# Patient Record
Sex: Female | Born: 1975 | Race: White | Hispanic: No | Marital: Married | Smoking: Never smoker
Health system: Southern US, Community
[De-identification: ages and names within clinical notes are randomized; demographics above are authoritative.]

## PROBLEM LIST (undated history)

## (undated) DIAGNOSIS — R1031 Right lower quadrant pain: Secondary | ICD-10-CM

## (undated) DIAGNOSIS — G43909 Migraine, unspecified, not intractable, without status migrainosus: Secondary | ICD-10-CM

## (undated) DIAGNOSIS — F988 Other specified behavioral and emotional disorders with onset usually occurring in childhood and adolescence: Secondary | ICD-10-CM

## (undated) DIAGNOSIS — Z0000 Encounter for general adult medical examination without abnormal findings: Secondary | ICD-10-CM

## (undated) DIAGNOSIS — M797 Fibromyalgia: Secondary | ICD-10-CM

## (undated) DIAGNOSIS — E782 Mixed hyperlipidemia: Principal | ICD-10-CM

## (undated) DIAGNOSIS — I2699 Other pulmonary embolism without acute cor pulmonale: Secondary | ICD-10-CM

## (undated) DIAGNOSIS — F419 Anxiety disorder, unspecified: Secondary | ICD-10-CM

## (undated) DIAGNOSIS — T7840XA Allergy, unspecified, initial encounter: Secondary | ICD-10-CM

## (undated) DIAGNOSIS — K8020 Calculus of gallbladder without cholecystitis without obstruction: Secondary | ICD-10-CM

## (undated) DIAGNOSIS — N809 Endometriosis, unspecified: Secondary | ICD-10-CM

## (undated) DIAGNOSIS — M1990 Unspecified osteoarthritis, unspecified site: Secondary | ICD-10-CM

## (undated) DIAGNOSIS — F418 Other specified anxiety disorders: Secondary | ICD-10-CM

## (undated) HISTORY — PX: TUBAL LIGATION: SHX77

## (undated) HISTORY — DX: Unspecified osteoarthritis, unspecified site: M19.90

## (undated) HISTORY — PX: WISDOM TOOTH EXTRACTION: SHX21

## (undated) HISTORY — DX: Fibromyalgia: M79.7

## (undated) HISTORY — DX: Other specified anxiety disorders: F41.8

## (undated) HISTORY — DX: Endometriosis, unspecified: N80.9

## (undated) HISTORY — DX: Migraine, unspecified, not intractable, without status migrainosus: G43.909

## (undated) HISTORY — PX: HERNIA REPAIR: SHX51

## (undated) HISTORY — DX: Anxiety disorder, unspecified: F41.9

## (undated) HISTORY — DX: Other specified behavioral and emotional disorders with onset usually occurring in childhood and adolescence: F98.8

## (undated) HISTORY — DX: Right lower quadrant pain: R10.31

## (undated) HISTORY — DX: Encounter for general adult medical examination without abnormal findings: Z00.00

## (undated) HISTORY — PX: EYE SURGERY: SHX253

## (undated) HISTORY — DX: Allergy, unspecified, initial encounter: T78.40XA

## (undated) HISTORY — PX: LAPAROTOMY: SHX154

## (undated) HISTORY — DX: Mixed hyperlipidemia: E78.2

---

## 2001-02-19 ENCOUNTER — Encounter: Admission: RE | Admit: 2001-02-19 | Discharge: 2001-02-19 | Payer: Self-pay | Admitting: Family Medicine

## 2002-07-07 ENCOUNTER — Other Ambulatory Visit: Admission: RE | Admit: 2002-07-07 | Discharge: 2002-07-07 | Payer: Self-pay | Admitting: Obstetrics and Gynecology

## 2002-11-24 ENCOUNTER — Ambulatory Visit (HOSPITAL_COMMUNITY): Admission: RE | Admit: 2002-11-24 | Discharge: 2002-11-24 | Payer: Self-pay | Admitting: Obstetrics and Gynecology

## 2002-11-24 ENCOUNTER — Encounter (INDEPENDENT_AMBULATORY_CARE_PROVIDER_SITE_OTHER): Payer: Self-pay | Admitting: Specialist

## 2003-07-21 ENCOUNTER — Other Ambulatory Visit: Admission: RE | Admit: 2003-07-21 | Discharge: 2003-07-21 | Payer: Self-pay | Admitting: Obstetrics and Gynecology

## 2004-07-25 ENCOUNTER — Other Ambulatory Visit: Admission: RE | Admit: 2004-07-25 | Discharge: 2004-07-25 | Payer: Self-pay | Admitting: Obstetrics and Gynecology

## 2008-09-25 HISTORY — PX: ABDOMINAL HYSTERECTOMY: SHX81

## 2010-05-03 ENCOUNTER — Emergency Department (HOSPITAL_BASED_OUTPATIENT_CLINIC_OR_DEPARTMENT_OTHER): Admission: EM | Admit: 2010-05-03 | Discharge: 2010-05-04 | Payer: Self-pay | Admitting: Emergency Medicine

## 2010-05-04 ENCOUNTER — Ambulatory Visit: Payer: Self-pay | Admitting: Interventional Radiology

## 2010-12-09 LAB — URINALYSIS, ROUTINE W REFLEX MICROSCOPIC
Bilirubin Urine: NEGATIVE
Glucose, UA: NEGATIVE mg/dL
Leukocytes, UA: NEGATIVE
Protein, ur: NEGATIVE mg/dL
Specific Gravity, Urine: 1.018 (ref 1.005–1.030)
Urobilinogen, UA: 0.2 mg/dL (ref 0.0–1.0)

## 2010-12-09 LAB — URINE MICROSCOPIC-ADD ON

## 2010-12-09 LAB — DIFFERENTIAL
Eosinophils Absolute: 0.1 10*3/uL (ref 0.0–0.7)
Eosinophils Relative: 1 % (ref 0–5)
Monocytes Absolute: 0.9 10*3/uL (ref 0.1–1.0)
Monocytes Relative: 10 % (ref 3–12)
Neutrophils Relative %: 61 % (ref 43–77)

## 2010-12-09 LAB — BASIC METABOLIC PANEL
CO2: 28 mEq/L (ref 19–32)
Calcium: 9.3 mg/dL (ref 8.4–10.5)
Chloride: 102 mEq/L (ref 96–112)
GFR calc Af Amer: >60 mL/min (ref >60)
GFR calc non Af Amer: >60 mL/min (ref >60)

## 2010-12-09 LAB — CBC
RBC: 4.29 MIL/uL (ref 3.87–5.11)
RDW: 11.4 % — ABNORMAL LOW (ref 11.5–15.5)
WBC: 9.1 10*3/uL (ref 4.0–10.5)

## 2011-02-10 NOTE — Op Note (Signed)
NAME:  Whitney Trujillo, Whitney Trujillo                       ACCOUNT NO.:  [id number]   MEDICAL RECORD NO.:  [id number]                   PATIENT TYPE:  AMB   LOCATION:  SDC                                  FACILITY:  [facility]   PHYSICIAN:  Maxie Better, M.D.            DATE OF BIRTH:  [birth year]   DATE OF PROCEDURE:  11/24/2002  DATE OF DISCHARGE:                                 OPERATIVE REPORT   PREOPERATIVE DIAGNOSES:  Severe dysmenorrhea.   PROCEDURE:  1. Diagnostic laparoscopy.  2. Fulguration of endometriotic implants.  3. Peritoneal biopsy.   POSTOPERATIVE DIAGNOSES:  1. Severe dysmenorrhea.  2. Stage I pelvic endometriosis.   ANESTHESIA:  General.   SURGEON:  Maxie Better, M.D.   INDICATIONS:  This is a 35 year old G0 married white female with severe  dysmenorrhea who now presents for surgical evaluation.  Risks and benefits  of the procedure have been explained to the patient.  Consent was signed.  The patient was transferred to the operating room.   PROCEDURE:  Under adequate general anesthesia the patient was placed in the  dorsal lithotomy position.  Examination under anesthesia revealed a small  anteverted uterus.  No adnexal masses could be appreciated.  The patient was  sterilely prepped and draped in the usual fashion.  The bladder catheterized  for small amount of urine.  A bivalve speculum was placed in vagina.  Single  tooth tenaculum was placed in the anterior lip of the cervix.  An acorn  cannula was introduced into the cervical os and attached to tenaculum for  manipulation of the uterus.  The bivalve speculum was then removed.  Attention was then turned to the abdomen where 0.25% Marcaine was injected  infraumbilical.  A small infraumbilical incision was then made.  Veress  needle was introduced.  A test of saline with good placement noted.  Open  pressure of 8 was noted.  2.5 L of carbon dioxide was insufflated.  The  Veress needle was removed.  A  disposable 10 mm trocar with sleeve was  introduced into the abdomen without incident.  A lighted video laparoscope  was introduced through that port.  The patient was placed in Trendelenburg.  A second port was then placed suprapubically with a small incision being  made suprapubically and under direct visualization a 5 mm port was then  placed.  Through that port of the suprapubic area the pelvis was inspected.  The uterus was noted to be normal.  Both ovaries and tubes were noted to be  normal.  The anterior cul-de-sac was without any endometriotic implant.  Posterior cul-de-sac with three areas of implants surrounding the left  uterosacral ligament on the right, two other spots of endometriotic implants  were noted.  __________ windows was noted on the right side.  Left  peritoneal biopsy was performed and the endometriotic implants were  fulgurated using __________  apparatus.  The appendix was noted to  be  normal.  Normal liver edges noted.  The procedure was then terminated by  deflating the abdomen and under direct visualization the infraumbilical port  was ultimately removed taking care not to bring up any underlying structure.  The suprapubic port had been removed.  The instruments from the vagina were  then removed.  The infraumbilical port was closed with Dermabond.  The skin  on the suprapubic site with a single suture of 4-0 Vicryl.   SPECIMENS:  Peritoneal biopsy sent for evaluation of endometriosis.   ESTIMATED BLOOD LOSS:  Minimal.   COMPLICATIONS:  None.   DISPOSITION:  The patient tolerated procedure well.  Was transferred to  recovery room in stable condition.                                               Maxie Better, M.D.    Level Green/MEDQ  D:  11/24/2002  T:  11/24/2002  Job:  [id number]

## 2011-09-26 HISTORY — PX: OOPHORECTOMY: SHX86

## 2013-03-13 ENCOUNTER — Emergency Department (HOSPITAL_COMMUNITY): Payer: 59

## 2013-03-13 ENCOUNTER — Emergency Department (HOSPITAL_COMMUNITY)
Admission: EM | Admit: 2013-03-13 | Discharge: 2013-03-13 | Disposition: A | Discharge status: Home / Self Care | Payer: 59 | Attending: Emergency Medicine | Admitting: Emergency Medicine

## 2013-03-13 ENCOUNTER — Encounter (HOSPITAL_COMMUNITY): Payer: Self-pay | Admitting: Family Medicine

## 2013-03-13 DIAGNOSIS — Z79899 Other long term (current) drug therapy: Secondary | ICD-10-CM | POA: Insufficient documentation

## 2013-03-13 DIAGNOSIS — R748 Abnormal levels of other serum enzymes: Secondary | ICD-10-CM | POA: Insufficient documentation

## 2013-03-13 DIAGNOSIS — K8020 Calculus of gallbladder without cholecystitis without obstruction: Secondary | ICD-10-CM | POA: Insufficient documentation

## 2013-03-13 DIAGNOSIS — R110 Nausea: Secondary | ICD-10-CM | POA: Insufficient documentation

## 2013-03-13 DIAGNOSIS — R17 Unspecified jaundice: Secondary | ICD-10-CM | POA: Insufficient documentation

## 2013-03-13 DIAGNOSIS — Z91040 Latex allergy status: Secondary | ICD-10-CM | POA: Insufficient documentation

## 2013-03-13 DIAGNOSIS — R6883 Chills (without fever): Secondary | ICD-10-CM | POA: Insufficient documentation

## 2013-03-13 DIAGNOSIS — Z8679 Personal history of other diseases of the circulatory system: Secondary | ICD-10-CM | POA: Insufficient documentation

## 2013-03-13 HISTORY — DX: Calculus of gallbladder without cholecystitis without obstruction: K80.20

## 2013-03-13 HISTORY — DX: Other pulmonary embolism without acute cor pulmonale: I26.99

## 2013-03-13 LAB — URINALYSIS, ROUTINE W REFLEX MICROSCOPIC
Leukocytes, UA: NEGATIVE
Nitrite: NEGATIVE
Protein, ur: NEGATIVE mg/dL
pH: 6.5 (ref 5.0–8.0)

## 2013-03-13 LAB — CBC WITH DIFFERENTIAL/PLATELET
Basophils Absolute: 0 10*3/uL (ref 0.0–0.1)
Basophils Relative: 1 % (ref 0–1)
Eosinophils Absolute: 0 10*3/uL (ref 0.0–0.7)
HCT: 42.1 % (ref 36.0–46.0)
Hemoglobin: 15 g/dL (ref 12.0–15.0)
Lymphocytes Relative: 47 % — ABNORMAL HIGH (ref 12–46)
Lymphs Abs: 3.1 10*3/uL (ref 0.7–4.0)
MCH: 29.5 pg (ref 26.0–34.0)
MCHC: 35.6 g/dL (ref 30.0–36.0)
Monocytes Absolute: 0.6 10*3/uL (ref 0.1–1.0)
Monocytes Relative: 9 % (ref 3–12)
Neutrophils Relative %: 43 % (ref 43–77)
RBC: 5.08 MIL/uL (ref 3.87–5.11)
RDW: 11.7 % (ref 11.5–15.5)
WBC: 6.7 10*3/uL (ref 4.0–10.5)

## 2013-03-13 LAB — COMPREHENSIVE METABOLIC PANEL
AST: 15 U/L (ref 0–37)
BUN: 8 mg/dL (ref 6–23)
CO2: 26 mEq/L (ref 19–32)
Creatinine, Ser: 0.76 mg/dL (ref 0.50–1.10)
Sodium: 136 mEq/L (ref 135–145)
Total Bilirubin: 1.9 mg/dL — ABNORMAL HIGH (ref 0.3–1.2)
Total Protein: 7.2 g/dL (ref 6.0–8.3)

## 2013-03-13 LAB — URINE MICROSCOPIC-ADD ON

## 2013-03-13 LAB — LIPASE, BLOOD: Lipase: 62 U/L — ABNORMAL HIGH (ref 11–59)

## 2013-03-13 MED ORDER — MORPHINE SULFATE 4 MG/ML IJ SOLN
4.0000 mg | Freq: Once | INTRAMUSCULAR | Status: AC
  Administered 2013-03-13: 4 mg via INTRAVENOUS
  Filled 2013-03-13: qty 1

## 2013-03-13 MED ORDER — SODIUM CHLORIDE 0.9 % IV BOLUS (SEPSIS)
1000.0000 mL | Freq: Once | INTRAVENOUS | Status: AC
  Administered 2013-03-13: 1000 mL via INTRAVENOUS

## 2013-03-13 MED ORDER — ONDANSETRON HCL 4 MG/2ML IJ SOLN
4.0000 mg | Freq: Once | INTRAMUSCULAR | Status: AC
  Administered 2013-03-13: 4 mg via INTRAVENOUS
  Filled 2013-03-13: qty 2

## 2013-03-13 MED ORDER — OXYCODONE-ACETAMINOPHEN 5-325 MG PO TABS: 1.0000 | ORAL_TABLET | ORAL | Status: DC | PRN

## 2013-03-13 MED ORDER — ONDANSETRON 8 MG PO TBDP: ORAL_TABLET | ORAL | Status: DC

## 2013-03-13 MED ORDER — OXYCODONE-ACETAMINOPHEN 5-325 MG PO TABS
2.0000 | ORAL_TABLET | Freq: Once | ORAL | Status: AC
  Administered 2013-03-13: 2 via ORAL
  Filled 2013-03-13: qty 2

## 2013-03-13 NOTE — ED Notes (Signed)
Waiting for 2nd bolus to finish before discharging pt per MD.

## 2013-03-13 NOTE — ED Notes (Signed)
Patient states that she has gallstones. Presents c/o abdominal pain. 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 States pain keeps getting worse. Has not had medication for pain.

## 2013-03-13 NOTE — ED Provider Notes (Signed)
History     CSN: [id number]  Arrival date & time 03/13/13  1953   First MD Initiated Contact with Patient 03/13/13 2021      Chief Complaint  Patient presents with  . Abdominal Pain   HPI  History provided by the patient. Patient is a 37 year old female with past history of PE and recent diagnosis of gallstones presents with complaints of persistent upper abdominal pains. Patient first began having symptoms 1-2 weeks ago. She was being worked up by her Scientific laboratory technician in [employer]. She had ultrasound last week on Thursday demonstrating gallstones. She continued to have pains and was not use any treatments at home for her symptoms. She followed up again today with a GI specialist who sent off blood work but did not feel she had any emergent or concerning symptoms from her gallstones. She does state that her GI specialist also thought she may have Gilbert's syndrome. Patient has been scheduled with the general surgeon through have her gallbladder and gallstones removed on July 3. She is concerned that she is too uncomfortable to wait that long. Her pains are worse with eating and drinking. They are described as sharp radiating around the right back but also because her whole abdomen to be in pain. She has been nauseous without any episodes of vomiting. Denies fever but has had some chills. No other aggravating or alleviating factors. No other associated symptoms.     Past Medical History  Diagnosis Date  . Gallstones   . Pulmonary emboli     Past Surgical History  Procedure Laterality Date  . Laparotomy    . Cesarean section  2007, 2009  . Abdominal hysterectomy      No family history on file.  History  Substance Use Topics  . Smoking status: Not on file  . Smokeless tobacco: Not on file  . Alcohol Use: Yes     Comment: Socially    OB History   Grav Para Term Preterm Abortions TAB SAB Ect Mult Living                  Review of Systems  Constitutional: Positive  for chills. Negative for fever, diaphoresis and appetite change.  Gastrointestinal: Positive for nausea and abdominal pain. Negative for vomiting, diarrhea and constipation.  Genitourinary: Negative for dysuria, frequency, hematuria and flank pain.  Skin: Negative for rash.  All other systems reviewed and are negative.    Allergies  Gluten meal and Latex  Home Medications   Current Outpatient Rx  Name  Route  Sig  Dispense  Refill  . atomoxetine (STRATTERA) 80 MG capsule   Oral   Take 80 mg by mouth daily.         Marland Kitchen dicyclomine (BENTYL) 20 MG tablet   Oral   Take 20 mg by mouth every 6 (six) hours as needed (IBS).         Marland Kitchen ibuprofen (ADVIL,MOTRIN) 200 MG tablet   Oral   Take 800 mg by mouth every 6 (six) hours as needed for pain.           BP 112/68  Pulse 94  Temp(Src) 98.3 F (36.8 C) (Oral)  Resp 21  Ht 4\' 11"  (1.499 m)  Wt 108 lb (48.988 kg)  BMI 21.8 kg/m2  SpO2 100%  Physical Exam  Nursing note and vitals reviewed. Constitutional: She is oriented to person, place, and time. She appears well-developed and well-nourished. No distress.  HENT:  Head: Normocephalic.  Eyes: Conjunctivae are normal.  No icterus  Cardiovascular: Normal rate and regular rhythm.   No murmur heard. Pulmonary/Chest: Effort normal and breath sounds normal. No respiratory distress. She has no wheezes. She has no rales.  Abdominal: Soft. She exhibits no distension and no mass. There is no hepatosplenomegaly. There is tenderness in the right upper quadrant, epigastric area and left upper quadrant. There is positive Murphy's sign. There is no rigidity, no rebound, no guarding, no CVA tenderness and no tenderness at McBurney's point.  Patient has some mild diffuse tenderness with increased pains across the right upper quadrant and epigastric area.  Musculoskeletal: Normal range of motion. She exhibits no edema.  Neurological: She is alert and oriented to person, place, and time.  Skin:  Skin is warm and dry. No rash noted.  No jaundice  Psychiatric: She has a normal mood and affect. Her behavior is normal.    ED Course  Procedures   Results for orders placed during the hospital encounter of 03/13/13  CBC WITH DIFFERENTIAL      Result Value Range   WBC 6.7  4.0 - 10.5 K/uL   RBC 5.08  3.87 - 5.11 MIL/uL   Hemoglobin 15.0  12.0 - 15.0 g/dL   HCT 16.1  09.6 - 04.5 %   MCV 82.9  78.0 - 100.0 fL   MCH 29.5  26.0 - 34.0 pg   MCHC 35.6  30.0 - 36.0 g/dL   RDW 40.9  81.1 - 91.4 %   Platelets 228  150 - 400 K/uL   Neutrophils Relative % 43  43 - 77 %   Neutro Abs 2.9  1.7 - 7.7 K/uL   Lymphocytes Relative 47 (*) 12 - 46 %   Lymphs Abs 3.1  0.7 - 4.0 K/uL   Monocytes Relative 9  3 - 12 %   Monocytes Absolute 0.6  0.1 - 1.0 K/uL   Eosinophils Relative 0  0 - 5 %   Eosinophils Absolute 0.0  0.0 - 0.7 K/uL   Basophils Relative 1  0 - 1 %   Basophils Absolute 0.0  0.0 - 0.1 K/uL  COMPREHENSIVE METABOLIC PANEL      Result Value Range   Sodium 136  135 - 145 mEq/L   Potassium 3.2 (*) 3.5 - 5.1 mEq/L   Chloride 100  96 - 112 mEq/L   CO2 26  19 - 32 mEq/L   Glucose, Bld 94  70 - 99 mg/dL   BUN 8  6 - 23 mg/dL   Creatinine, Ser 7.82  0.50 - 1.10 mg/dL   Calcium 9.5  8.4 - 95.6 mg/dL   Total Protein 7.2  6.0 - 8.3 g/dL   Albumin 4.4  3.5 - 5.2 g/dL   AST 15  0 - 37 U/L   ALT 14  0 - 35 U/L   Alkaline Phosphatase 40  39 - 117 U/L   Total Bilirubin 1.9 (*) 0.3 - 1.2 mg/dL   GFR calc non Af Amer >90  >90 mL/min   GFR calc Af Amer >90  >90 mL/min  LIPASE, BLOOD      Result Value Range   Lipase 62 (*) 11 - 59 U/L  URINALYSIS, ROUTINE W REFLEX MICROSCOPIC      Result Value Range   Color, Urine YELLOW  YELLOW   APPearance CLEAR  CLEAR   Specific Gravity, Urine 1.009  1.005 - 1.030   pH 6.5  5.0 - 8.0   Glucose, UA  NEGATIVE  NEGATIVE mg/dL   Hgb urine dipstick TRACE (*) NEGATIVE   Bilirubin Urine NEGATIVE  NEGATIVE   Ketones, ur NEGATIVE  NEGATIVE mg/dL   Protein,  ur NEGATIVE  NEGATIVE mg/dL   Urobilinogen, UA 0.2  0.0 - 1.0 mg/dL   Nitrite NEGATIVE  NEGATIVE   Leukocytes, UA NEGATIVE  NEGATIVE  URINE MICROSCOPIC-ADD ON      Result Value Range   Squamous Epithelial / LPF RARE  RARE   WBC, UA 0-2  <3 WBC/hpf   RBC / HPF 0-2  <3 RBC/hpf   Bacteria, UA RARE  RARE       US Abdomen Complete  03/13/2013   *RADIOLOGY REPORT*  Clinical Data:  Right upper quadrant pain  COMPLETE ABDOMINAL ULTRASOUND  Comparison:  Recent abdominal ultrasound 03/06/2013  Findings:  Gallbladder:  Mobile 3 mm echogenic focus consistent with a solitary gallstone.  No gallbladder wall thickening or pericholecystic fluid.  Per the sonographer, the sonographic Murphy's sign was negative.  Common bile duct:  Within normal limits at 3.1 mm in caliber.  Liver:  No focal lesion identified.  Within normal limits in parenchymal echogenicity.  IVC:  Appears normal.  Pancreas:  No focal abnormality seen.  Spleen:  Sonographically unremarkable.  7.4 cm in length.  Right Kidney:  Normal reniform contour without hydronephrosis.  The kidney measures 8 cm in length.  Left Kidney:  Normal reniform contour without hydronephrosis.  The kidney measures 8.2 cm in length.  Abdominal aorta:  No aneurysm identified.  IMPRESSION:  Cholelithiasis without sonographic findings of acute cholecystitis.   Original Report Authenticated By: Malachy Moan, M.D.     1. Gallstones   2. Hyperbilirubinemia   3. Elevated lipase       MDM  8:30PM patient seen and evaluated. The patient appears well in no acute distress. Does appear in mild discomfort.  Patient is feeling much better after medications. Her lab tests and ultrasound did not show any clear signs for an emergent condition. She does have slightly elevated lipase and bilirubin level remains elevated per her history. There no signs of ultrasound of obstructing common bile duct stone. LFTs are normal. At this time we will try outpatient treatment and  provided prescriptions for pain medication and nausea medication. She will contact her surgeon and specialist tomorrow for close followup. She was given strict return precautions.      Angus Seller, PA-C 03/14/13 [phone]

## 2013-03-13 NOTE — ED Notes (Signed)
US at bedside

## 2013-03-15 NOTE — ED Provider Notes (Signed)
Medical screening examination/treatment/procedure(s) were performed by non-physician practitioner and as supervising physician I was immediately available for consultation/collaboration.  Caleen Taaffe R. Brodan Grewell, MD 03/15/13 1548 

## 2013-07-31 ENCOUNTER — Other Ambulatory Visit: Payer: Self-pay

## 2013-09-25 HISTORY — PX: CHOLECYSTECTOMY: SHX55

## 2014-07-07 ENCOUNTER — Encounter: Payer: Self-pay | Admitting: Internal Medicine

## 2014-07-07 ENCOUNTER — Ambulatory Visit (INDEPENDENT_AMBULATORY_CARE_PROVIDER_SITE_OTHER): Payer: 59 | Admitting: Internal Medicine

## 2014-07-07 ENCOUNTER — Encounter: Payer: Self-pay | Admitting: *Deleted

## 2014-07-07 VITALS — BP 116/60 | HR 59 | Temp 98.5°F | Resp 16 | Ht 59.0 in | Wt 117.0 lb

## 2014-07-07 DIAGNOSIS — I2699 Other pulmonary embolism without acute cor pulmonale: Secondary | ICD-10-CM | POA: Insufficient documentation

## 2014-07-07 DIAGNOSIS — Z90710 Acquired absence of both cervix and uterus: Secondary | ICD-10-CM

## 2014-07-07 DIAGNOSIS — N809 Endometriosis, unspecified: Secondary | ICD-10-CM

## 2014-07-07 DIAGNOSIS — F411 Generalized anxiety disorder: Secondary | ICD-10-CM

## 2014-07-07 HISTORY — DX: Endometriosis, unspecified: N80.9

## 2014-07-07 HISTORY — DX: Other pulmonary embolism without acute cor pulmonale: I26.99

## 2014-07-07 NOTE — Patient Instructions (Signed)
Schedule CPE

## 2014-07-07 NOTE — Progress Notes (Signed)
Subjective:    Patient ID: Whitney Trujillo, female    DOB: [birth year], 38 y.o.   MRN: [id number]  HPI New pt here for first visit  To establish primary care  PMH complicated endometriosis  S/P hysterectomy with left ovary retained,  Anxiety on Lexapro for 1 year, and bilateral PE post surgical  PE bilateral distribution had work up with [facility] and pt told there was not a genetic compornent to this   Endometriosis has [facility]  Who believes endometirosis involving bowel causing pain and diarrhea.  Dr. Noland FordyceLentz has recommended surgery but pt holding off  "as long as I can"    Labs from Dr. Loralie ChampagneMcKinney's office great B12 high but pt was taking sublingual B12 too often  She only takes a few times a week now   See scanned labs   Allergies  Allergen Reactions  . Gluten Meal Other (See Comments)    Cramping and diarrhea  . Latex Itching   Past Medical History  Diagnosis Date  . Gallstones   . Pulmonary emboli   . Anxiety   . Endometriosis    Past Surgical History  Procedure Laterality Date  . Laparotomy    . Cesarean section  2007, 2009  . Abdominal hysterectomy    . Cholecystectomy    . Eye surgery    . Wisdom tooth extraction     History   Social History  . Marital Status: Single    Spouse Name: N/A    Number of Children: N/A  . Years of Education: N/A   Occupational History  . Not on file.   Social History Main Topics  . Smoking status: Never Smoker   . Smokeless tobacco: Never Used  . Alcohol Use: Yes     Comment: Socially  . Drug Use: No  . Sexual Activity: Yes    Partners: Male   Other Topics Concern  . Not on file   Social History Narrative  . No narrative on file   Family History  Problem Relation Age of Onset  . Cancer Father   . Colon cancer Father   . Ovarian cancer Maternal Aunt    Patient Active Problem List   Diagnosis Date Noted  . Anxiety state 07/07/2014  . Endometriosis 07/07/2014  . Acute  pulmonary embolism  5 wks post hysterectomy 07/07/2014   Current Outpatient Prescriptions on File Prior to Visit  Medication Sig Dispense Refill  . dicyclomine (BENTYL) 20 MG tablet Take 20 mg by mouth every 6 (six) hours as needed (IBS).      Marland Kitchen. ibuprofen (ADVIL,MOTRIN) 200 MG tablet Take 800 mg by mouth every 6 (six) hours as needed for pain.       No current facility-administered medications on file prior to visit.       Review of Systems See HPI    Objective:   Physical Exam Physical Exam  Nursing note and vitals reviewed.  Constitutional: She is oriented to person, place, and time. She appears well-developed and well-nourished.  HENT:  Head: Normocephalic and atraumatic.  Cardiovascular: Normal rate and regular rhythm. Exam reveals no gallop and no friction rub.  No murmur heard.  Pulmonary/Chest: Breath sounds normal. She has no wheezes. She has no rales.  Neurological: She is alert and oriented to person, place, and time.  Skin: Skin is warm and dry.  Psychiatric: She has a normal mood and affect. Her behavior is normal.  Assessment & Plan:  Anxiety  Continue Lexapro  Bilateral post surgical PE  Genetic work up neg per pt report  Will need records  Complicated endometriosis with suspected bowel involvement.  Recommedned Left ovary removal .     Managed by gyn onc [facility] and Dr. Loman Chromanhoton   Schedule CPE

## 2014-07-30 ENCOUNTER — Ambulatory Visit (INDEPENDENT_AMBULATORY_CARE_PROVIDER_SITE_OTHER): Payer: 59 | Admitting: Internal Medicine

## 2014-07-30 ENCOUNTER — Encounter (HOSPITAL_BASED_OUTPATIENT_CLINIC_OR_DEPARTMENT_OTHER): Payer: Self-pay | Admitting: *Deleted

## 2014-07-30 ENCOUNTER — Emergency Department (HOSPITAL_BASED_OUTPATIENT_CLINIC_OR_DEPARTMENT_OTHER)
Admission: EM | Admit: 2014-07-30 | Discharge: 2014-07-30 | Disposition: A | Discharge status: Home / Self Care | Payer: 59 | Attending: Emergency Medicine | Admitting: Emergency Medicine

## 2014-07-30 ENCOUNTER — Emergency Department (HOSPITAL_BASED_OUTPATIENT_CLINIC_OR_DEPARTMENT_OTHER): Payer: 59

## 2014-07-30 ENCOUNTER — Encounter: Payer: Self-pay | Admitting: Internal Medicine

## 2014-07-30 VITALS — BP 101/63 | HR 80 | Temp 98.3°F | Resp 16 | Ht 59.0 in | Wt 115.0 lb

## 2014-07-30 DIAGNOSIS — R197 Diarrhea, unspecified: Secondary | ICD-10-CM

## 2014-07-30 DIAGNOSIS — Z91040 Latex allergy status: Secondary | ICD-10-CM | POA: Insufficient documentation

## 2014-07-30 DIAGNOSIS — Z79899 Other long term (current) drug therapy: Secondary | ICD-10-CM | POA: Diagnosis not present

## 2014-07-30 DIAGNOSIS — F419 Anxiety disorder, unspecified: Secondary | ICD-10-CM | POA: Diagnosis not present

## 2014-07-30 DIAGNOSIS — Z9089 Acquired absence of other organs: Secondary | ICD-10-CM | POA: Insufficient documentation

## 2014-07-30 DIAGNOSIS — K529 Noninfective gastroenteritis and colitis, unspecified: Secondary | ICD-10-CM | POA: Insufficient documentation

## 2014-07-30 DIAGNOSIS — Z1211 Encounter for screening for malignant neoplasm of colon: Secondary | ICD-10-CM

## 2014-07-30 DIAGNOSIS — Z86711 Personal history of pulmonary embolism: Secondary | ICD-10-CM | POA: Diagnosis not present

## 2014-07-30 DIAGNOSIS — Z90710 Acquired absence of both cervix and uterus: Secondary | ICD-10-CM | POA: Diagnosis not present

## 2014-07-30 DIAGNOSIS — R079 Chest pain, unspecified: Secondary | ICD-10-CM

## 2014-07-30 DIAGNOSIS — Z8742 Personal history of other diseases of the female genital tract: Secondary | ICD-10-CM | POA: Insufficient documentation

## 2014-07-30 DIAGNOSIS — R1084 Generalized abdominal pain: Secondary | ICD-10-CM

## 2014-07-30 DIAGNOSIS — R1013 Epigastric pain: Secondary | ICD-10-CM | POA: Diagnosis present

## 2014-07-30 DIAGNOSIS — R109 Unspecified abdominal pain: Secondary | ICD-10-CM

## 2014-07-30 LAB — URINALYSIS, ROUTINE W REFLEX MICROSCOPIC
BILIRUBIN URINE: NEGATIVE
Glucose, UA: NEGATIVE mg/dL
Ketones, ur: 15 mg/dL — AB
Leukocytes, UA: NEGATIVE
NITRITE: NEGATIVE
Protein, ur: NEGATIVE mg/dL
SPECIFIC GRAVITY, URINE: 1.004 — AB (ref 1.005–1.030)
UROBILINOGEN UA: 0.2 mg/dL (ref 0.0–1.0)
pH: 6 (ref 5.0–8.0)

## 2014-07-30 LAB — CBC WITH DIFFERENTIAL/PLATELET
BASOS PCT: 0 % (ref 0–1)
Basophils Absolute: 0 10*3/uL (ref 0.0–0.1)
EOS ABS: 0 10*3/uL (ref 0.0–0.7)
EOS PCT: 0 % (ref 0–5)
HEMATOCRIT: 42.6 % (ref 36.0–46.0)
HEMOGLOBIN: 14.6 g/dL (ref 12.0–15.0)
Lymphocytes Relative: 34 % (ref 12–46)
Lymphs Abs: 2.3 10*3/uL (ref 0.7–4.0)
MCH: 30.3 pg (ref 26.0–34.0)
MCHC: 34.3 g/dL (ref 30.0–36.0)
MCV: 88.4 fL (ref 78.0–100.0)
MONO ABS: 0.7 10*3/uL (ref 0.1–1.0)
MONOS PCT: 10 % (ref 3–12)
Neutro Abs: 3.7 10*3/uL (ref 1.7–7.7)
Neutrophils Relative %: 56 % (ref 43–77)
Platelets: 221 10*3/uL (ref 150–400)
RBC: 4.82 MIL/uL (ref 3.87–5.11)
RDW: 12 % (ref 11.5–15.5)
WBC: 6.8 10*3/uL (ref 4.0–10.5)

## 2014-07-30 LAB — HEMOCCULT GUIAC POC 1CARD (OFFICE): Fecal Occult Blood, POC: NEGATIVE

## 2014-07-30 LAB — COMPREHENSIVE METABOLIC PANEL
ALBUMIN: 4.2 g/dL (ref 3.5–5.2)
ALT: 21 U/L (ref 0–35)
ANION GAP: 12 (ref 5–15)
AST: 22 U/L (ref 0–37)
Alkaline Phosphatase: 44 U/L (ref 39–117)
BUN: 10 mg/dL (ref 6–23)
CALCIUM: 9.6 mg/dL (ref 8.4–10.5)
CHLORIDE: 100 meq/L (ref 96–112)
CO2: 28 mEq/L (ref 19–32)
CREATININE: 0.8 mg/dL (ref 0.50–1.10)
GFR calc Af Amer: >90 mL/min (ref >90)
GFR calc non Af Amer: >90 mL/min (ref >90)
Glucose, Bld: 95 mg/dL (ref 70–99)
Potassium: 3.7 mEq/L (ref 3.7–5.3)
Sodium: 140 mEq/L (ref 137–147)
TOTAL PROTEIN: 7.2 g/dL (ref 6.0–8.3)
Total Bilirubin: 1.1 mg/dL (ref 0.3–1.2)

## 2014-07-30 LAB — LIPASE, BLOOD: LIPASE: 71 U/L — AB (ref 11–59)

## 2014-07-30 LAB — URINE MICROSCOPIC-ADD ON

## 2014-07-30 MED ORDER — MORPHINE SULFATE 4 MG/ML IJ SOLN
4.0000 mg | Freq: Once | INTRAMUSCULAR | Status: AC
  Administered 2014-07-30: 4 mg via INTRAVENOUS
  Filled 2014-07-30: qty 1

## 2014-07-30 MED ORDER — SODIUM CHLORIDE 0.9 % IV BOLUS (SEPSIS)
1000.0000 mL | Freq: Once | INTRAVENOUS | Status: AC
  Administered 2014-07-30: 1000 mL via INTRAVENOUS

## 2014-07-30 MED ORDER — ONDANSETRON HCL 4 MG/2ML IJ SOLN
4.0000 mg | Freq: Once | INTRAMUSCULAR | Status: AC
  Administered 2014-07-30: 4 mg via INTRAVENOUS
  Filled 2014-07-30: qty 2

## 2014-07-30 MED ORDER — OXYCODONE-ACETAMINOPHEN 5-325 MG PO TABS: 1.0000 | ORAL_TABLET | Freq: Four times a day (QID) | ORAL | Status: DC | PRN

## 2014-07-30 MED ORDER — IOHEXOL 300 MG/ML  SOLN
50.0000 mL | Freq: Once | INTRAMUSCULAR | Status: AC | PRN
  Administered 2014-07-30: 50 mL via ORAL

## 2014-07-30 MED ORDER — GI COCKTAIL ~~LOC~~
30.0000 mL | Freq: Once | ORAL | Status: AC
  Administered 2014-07-30: 30 mL via ORAL
  Filled 2014-07-30: qty 30

## 2014-07-30 MED ORDER — METRONIDAZOLE 500 MG PO TABS: 500.0000 mg | ORAL_TABLET | Freq: Two times a day (BID) | ORAL | Status: DC

## 2014-07-30 MED ORDER — CIPROFLOXACIN HCL 500 MG PO TABS: 500.0000 mg | ORAL_TABLET | Freq: Two times a day (BID) | ORAL | Status: DC

## 2014-07-30 MED ORDER — METRONIDAZOLE 500 MG PO TABS
500.0000 mg | ORAL_TABLET | Freq: Once | ORAL | Status: AC
  Administered 2014-07-30: 500 mg via ORAL
  Filled 2014-07-30: qty 1

## 2014-07-30 MED ORDER — IOHEXOL 300 MG/ML  SOLN
100.0000 mL | Freq: Once | INTRAMUSCULAR | Status: AC | PRN
  Administered 2014-07-30: 100 mL via INTRAVENOUS

## 2014-07-30 MED ORDER — CIPROFLOXACIN HCL 500 MG PO TABS
500.0000 mg | ORAL_TABLET | Freq: Once | ORAL | Status: AC
  Administered 2014-07-30: 500 mg via ORAL
  Filled 2014-07-30: qty 1

## 2014-07-30 NOTE — ED Provider Notes (Signed)
CSN: [id number]     Arrival date & time 07/30/14  1455 History   First MD Initiated Contact with Patient 07/30/14 1502     Chief Complaint  Patient presents with  . Abdominal Pain     (Consider location/radiation/quality/duration/timing/severity/associated sxs/prior Treatment) The history is provided by the patient.  Whitney Trujillo is a 38 y.o. female hx of endometriosis s/p hysterectomy, cholecystectomy here with abdominal pain and diarrhea. She had hysterectomy 5 years ago. Has intermittent lower abdominal pain and diarrhea since then. Since yesterday, had some epigastric burning and pain. The pain radiates to her entire abdomen. Has some nausea but denies any vomiting. Also has some diarrhea. Had some chills and low-grade temperature 99 at home.    Past Medical History  Diagnosis Date  . Gallstones   . Pulmonary emboli   . Anxiety   . Endometriosis    Past Surgical History  Procedure Laterality Date  . Laparotomy    . Cesarean section  2007, 2009  . Abdominal hysterectomy    . Cholecystectomy    . Eye surgery    . Wisdom tooth extraction     Family History  Problem Relation Age of Onset  . Cancer Father   . Colon cancer Father   . Ovarian cancer Maternal Aunt    History  Substance Use Topics  . Smoking status: Never Smoker   . Smokeless tobacco: Never Used  . Alcohol Use: Yes     Comment: Socially   OB History    No data available     Review of Systems  Gastrointestinal: Positive for nausea and abdominal pain.  All other systems reviewed and are negative.     Allergies  Gluten meal and Latex  Home Medications   Prior to Admission medications   Medication Sig Start Date End Date Taking? Authorizing Provider  dicyclomine (BENTYL) 20 MG tablet Take 20 mg by mouth every 6 (six) hours as needed (IBS).    Historical Provider, MD  escitalopram (LEXAPRO) 20 MG tablet Take 20 mg by mouth daily.    Historical Provider, MD  ibuprofen (ADVIL,MOTRIN) 200 MG  tablet Take 800 mg by mouth every 6 (six) hours as needed for pain.    Historical Provider, MD   BP 109/67 mmHg  Pulse 76  Temp(Src) 98.3 F (36.8 C) (Oral)  Resp 16  Ht 4\' 11"  (1.499 m)  Wt 115 lb (52.164 kg)  BMI 23.21 kg/m2  SpO2 100% Physical Exam  Constitutional: She is oriented to person, place, and time. She appears well-developed and well-nourished.  HENT:  Head: Normocephalic.  MM slightly dry   Eyes: Conjunctivae are normal. Pupils are equal, round, and reactive to light.  Neck: Normal range of motion. Neck supple.  Cardiovascular: Normal rate, regular rhythm and normal heart sounds.   Pulmonary/Chest: Effort normal and breath sounds normal. No respiratory distress. She has no wheezes. She has no rales.  Abdominal: Soft. Bowel sounds are normal.  Mild diffuse tenderness, worse in epigastric area. No rebound   Musculoskeletal: Normal range of motion. She exhibits no edema or tenderness.  Neurological: She is alert and oriented to person, place, and time. No cranial nerve deficit. Coordination normal.  Skin: Skin is warm and dry.  Psychiatric: She has a normal mood and affect. Her behavior is normal. Judgment and thought content normal.  Nursing note and vitals reviewed.   ED Course  Procedures (including critical care time) Labs Review Labs Reviewed  LIPASE, BLOOD - Abnormal; Notable for  the following:    Lipase 71 (*)    All other components within normal limits  URINALYSIS, ROUTINE W REFLEX MICROSCOPIC - Abnormal; Notable for the following:    Specific Gravity, Urine 1.004 (*)    Hgb urine dipstick TRACE (*)    Ketones, ur 15 (*)    All other components within normal limits  CBC WITH DIFFERENTIAL  COMPREHENSIVE METABOLIC PANEL  URINE MICROSCOPIC-ADD ON    Imaging Review Ct Abdomen Pelvis W Contrast  07/30/2014   CLINICAL DATA:  Burning epigastric abdominal pain extending inferiorly into the pelvis which began yesterday and is associated with nausea,  unrelieved with Tagamet. Surgical history includes hysterectomy and cholecystectomy.  EXAM: CT ABDOMEN AND PELVIS WITH CONTRAST  TECHNIQUE: Multidetector CT imaging of the abdomen and pelvis was performed using the standard protocol following bolus administration of intravenous contrast.  CONTRAST:  100mL OMNIPAQUE IOHEXOL 300 MG/ML IV. Oral contrast was also administered.  COMPARISON:  07/03/2013.  FINDINGS: Wall thickening involving the rectum, sigmoid colon, and descending colon. The cecum, ascending colon and transverse colon are not involved. The greatest involvement is in the distal sigmoid colon and rectum. Normal-appearing stomach and small bowel. No ascites. Normal appendix in the right mid pelvis. Small lipoma involving the ileocecal valve.  Normal-appearing liver, spleen, pancreas, adrenal glands, and kidneys. Gallbladder surgically absent. No biliary ductal dilation. Minimal right common iliac artery atherosclerosis. Patent visceral arteries. No significant lymphadenopathy.  Uterus surgically absent. No adnexal masses or free pelvic fluid. Urinary bladder unremarkable.  Bone window images unremarkable. Visualized lung bases clear. Heart size normal.  IMPRESSION: 1. Colitis involving these descending colon, sigmoid colon and rectum. The greatest involvement is in the distal sigmoid colon and rectum. 2. Otherwise normal examination post cholecystectomy and post hysterectomy.   Electronically Signed   By: Hulan Saashomas  Lawrence M.D.   On: 07/30/2014 17:56     EKG Interpretation None      MDM   Final diagnoses:  Abdominal pain   Whitney Trujillo is a 38 y.o. female here with ab pain. Consider partial SBO vs gastritis vs pain from scar tissue. Will get CT ab/pel, labs.   7:05 PM CT showed colitis. WBC nl. Doesn't appear septic. Given cipro/flagyl. Will d/c home.   Richardean Canalavid H Shavontae Gibeault, MD 07/30/14 [id number]

## 2014-07-30 NOTE — Discharge Instructions (Signed)
Take cipro, flagyl as prescribed.   Take percocet as needed for pain. Do NOT drive with it.   Follow up with your doctor.   Return to ER if you have fever, severe pain, vomiting.

## 2014-07-30 NOTE — ED Notes (Signed)
She woke yesterday am with burning epigastric pain. Pain comes and goes. She took Tagamet without relief.

## 2014-07-30 NOTE — Progress Notes (Signed)
Subjective:    Patient ID: Whitney Trujillo, female    DOB: [birth year], 38 y.o.   MRN: [id number]  HPI  10/13/ Anxiety Continue Lexapro  Bilateral post surgical PE Genetic work up neg per pt report Will need records  Complicated endometriosis with suspected bowel involvement. Recommedned Left ovary removal . Managed by gyn onc [facility] and Dr. Loman Chromanhoton   Schedule CPE   Whitney Trujillo is here for acute visit   Burining abd pain throught entire abd  ( not epigastric )  Began yesterday with low grade fever at home.  Has known extensive endometriosis,  S/p Hyst R oopherectomy and cholecystectomy.    Having diarrhea but this is chronic for her.  Nausea no vomiting .  No vag discharge No dysuria or urgency    Allergies  Allergen Reactions  . Gluten Meal Other (See Comments)    Cramping and diarrhea  . Latex Itching   Past Medical History  Diagnosis Date  . Gallstones   . Pulmonary emboli   . Anxiety   . Endometriosis    Past Surgical History  Procedure Laterality Date  . Laparotomy    . Cesarean section  2007, 2009  . Abdominal hysterectomy    . Cholecystectomy    . Eye surgery    . Wisdom tooth extraction     History   Social History  . Marital Status: Single    Spouse Name: N/A    Number of Children: N/A  . Years of Education: N/A   Occupational History  . Not on file.   Social History Main Topics  . Smoking status: Never Smoker   . Smokeless tobacco: Never Used  . Alcohol Use: Yes     Comment: Socially  . Drug Use: No  . Sexual Activity:    Partners: Male   Other Topics Concern  . Not on file   Social History Narrative  . No narrative on file   Family History  Problem Relation Age of Onset  . Cancer Father   . Colon cancer Father   . Ovarian cancer Maternal Aunt    Patient Active Problem List   Diagnosis Date Noted  . Anxiety state 07/07/2014  . Endometriosis 07/07/2014  . Acute pulmonary embolism  5 wks post hysterectomy 07/07/2014  . S/P  hysterectomy  Left ovary retained 07/07/2014   Current Outpatient Prescriptions on File Prior to Visit  Medication Sig Dispense Refill  . dicyclomine (BENTYL) 20 MG tablet Take 20 mg by mouth every 6 (six) hours as needed (IBS).    Marland Kitchen. escitalopram (LEXAPRO) 20 MG tablet Take 20 mg by mouth daily.    Marland Kitchen. ibuprofen (ADVIL,MOTRIN) 200 MG tablet Take 800 mg by mouth every 6 (six) hours as needed for pain.     No current facility-administered medications on file prior to visit.      Review of Systems See HPI    Objective:   Physical Exam Physical Exam  Nursing note and vitals reviewed.  Constitutional: She is oriented to person, place, and time. She appears well-developed and well-nourished.  HENT:  Head: Normocephalic and atraumatic.  Cardiovascular: Normal rate and regular rhythm. Exam reveals no gallop and no friction rub.  No murmur heard.  Pulmonary/Chest: Breath sounds normal. She has no wheezes. She has no rales.  ABD:  Generalized tenderness  BS pos.  No peritoneal signs.  Rectal no mass guaiac neg Neurological: She is alert and oriented to person, place, and time.  Skin: Skin is warm  and dry.  Psychiatric: She has a normal mood and affect. Her behavior is normal.            Assessment & Plan:  abd pain  Clinically I feel pt needs stat labs and imaging .  Will refer to ER for further work up.  Further management based on results

## 2014-08-03 ENCOUNTER — Telehealth: Payer: Self-pay | Admitting: *Deleted

## 2014-08-03 NOTE — Telephone Encounter (Signed)
Whitney Trujillo called complaining of diarrhea and some mild lower abdominal pain. She is on Cipro and Flagyl for Colitis. We called Dr. Marcello Fennelhoton's office (which is her GI) to get her worked in there. They are going to contact [facility] with a time to be worked in with Dr. Lanny Cramphoton.-eh

## 2014-08-06 ENCOUNTER — Encounter: Payer: Self-pay | Admitting: *Deleted

## 2014-09-28 ENCOUNTER — Telehealth: Payer: Self-pay | Admitting: *Deleted

## 2014-09-28 NOTE — Telephone Encounter (Signed)
Whitney Trujillo called asking if you would take over writing her Lexapro RX. The provider that currently writes it is now out of network on her insurance so she can not go there. She does not need any refills at this time but just wants to be sure you would be willing to do this.

## 2014-09-29 NOTE — Telephone Encounter (Signed)
Whitney Trujillo is aware that Dr. Constance GoltzSchoenhoff is willing to write her Lexapro -eh

## 2014-10-26 HISTORY — PX: CYSTOSCOPY: SUR368

## 2014-11-24 ENCOUNTER — Other Ambulatory Visit: Payer: Self-pay | Admitting: *Deleted

## 2014-11-24 DIAGNOSIS — Z0000 Encounter for general adult medical examination without abnormal findings: Secondary | ICD-10-CM

## 2014-11-25 ENCOUNTER — Encounter: Payer: Self-pay | Admitting: *Deleted

## 2014-11-25 ENCOUNTER — Ambulatory Visit (INDEPENDENT_AMBULATORY_CARE_PROVIDER_SITE_OTHER): Payer: 59 | Admitting: Internal Medicine

## 2014-11-25 ENCOUNTER — Encounter: Payer: Self-pay | Admitting: Internal Medicine

## 2014-11-25 VITALS — BP 106/64 | HR 68 | Resp 16 | Ht 60.0 in | Wt 118.0 lb

## 2014-11-25 DIAGNOSIS — Z0000 Encounter for general adult medical examination without abnormal findings: Secondary | ICD-10-CM

## 2014-11-25 DIAGNOSIS — Z139 Encounter for screening, unspecified: Secondary | ICD-10-CM

## 2014-11-25 DIAGNOSIS — R319 Hematuria, unspecified: Secondary | ICD-10-CM

## 2014-11-25 DIAGNOSIS — N809 Endometriosis, unspecified: Secondary | ICD-10-CM

## 2014-11-25 DIAGNOSIS — I2699 Other pulmonary embolism without acute cor pulmonale: Secondary | ICD-10-CM

## 2014-11-25 DIAGNOSIS — F411 Generalized anxiety disorder: Secondary | ICD-10-CM

## 2014-11-25 LAB — POCT URINALYSIS DIPSTICK
Bilirubin, UA: NEGATIVE
Blood, UA: POSITIVE
GLUCOSE UA: NEGATIVE
KETONES UA: NEGATIVE
Leukocytes, UA: NEGATIVE
NITRITE UA: NEGATIVE
PROTEIN UA: NEGATIVE
Spec Grav, UA: 1.01
UROBILINOGEN UA: NEGATIVE
pH, UA: 6.5

## 2014-11-25 LAB — CBC WITH DIFFERENTIAL/PLATELET
BASOS ABS: 0.1 10*3/uL (ref 0.0–0.1)
Basophils Relative: 1 % (ref 0–1)
EOS ABS: 0.2 10*3/uL (ref 0.0–0.7)
Eosinophils Relative: 3 % (ref 0–5)
HEMATOCRIT: 43.1 % (ref 36.0–46.0)
Hemoglobin: 14.1 g/dL (ref 12.0–15.0)
Lymphocytes Relative: 37 % (ref 12–46)
Lymphs Abs: 2.4 10*3/uL (ref 0.7–4.0)
MCH: 29.6 pg (ref 26.0–34.0)
MCHC: 32.7 g/dL (ref 30.0–36.0)
MCV: 90.5 fL (ref 78.0–100.0)
MPV: 10.1 fL (ref 8.6–12.4)
Monocytes Absolute: 0.3 10*3/uL (ref 0.1–1.0)
Monocytes Relative: 5 % (ref 3–12)
Neutro Abs: 3.5 10*3/uL (ref 1.7–7.7)
Neutrophils Relative %: 54 % (ref 43–77)
PLATELETS: 313 10*3/uL (ref 150–400)
RBC: 4.76 MIL/uL (ref 3.87–5.11)
RDW: 12.6 % (ref 11.5–15.5)
WBC: 6.5 10*3/uL (ref 4.0–10.5)

## 2014-11-25 LAB — TSH: TSH: 1.222 u[IU]/mL (ref 0.350–4.500)

## 2014-11-25 NOTE — Progress Notes (Signed)
Subjective:    Patient ID: Whitney Trujillo, female    DOB: 1976-09-09, 39 y.o.   MRN: [id number]  HPI  07/07/2014 note Assessment & Plan:  Anxiety Continue Lexapro  Bilateral post surgical PE Genetic work up neg per pt report Will need records  Complicated endometriosis with suspected bowel involvement. Recommedned Left ovary removal . Managed by gyn onc [facility] and Dr. Loman Chroman   Schedule CPE        07/30/2014 ER note Final diagnoses:  Abdominal pain   Whitney Trujillo is a 39 y.o. female here with ab pain. Consider partial SBO vs gastritis vs pain from scar tissue. Will get CT ab/pel, labs.   7:05 PM CT showed colitis. WBC nl. Doesn't appear septic. Given cipro/flagyl. Will d/c home.   Whitney Canal, MD 07/30/14 1905        TODAY:  Whitney Trujillo is here for CPE  HM  Recent pap from [facility],  Non smoker  Had mm at age 64 only  Needs screening now   Colonoscopy 2014 to rule out colitis   ([facility])  Endometriosis  Recent left oopherectomy [facility]  Doing well .   She is now S/P hysterectomy and BSO  No hot flushes    Anxiety she is on Lexapro 20 mg but states her anxiety is much better and would like to try 10 mg dose   Allergies  Allergen Reactions  . Gluten Meal Other (See Comments)    Cramping and diarrhea  . Latex Itching   Past Medical History  Diagnosis Date  . Gallstones   . Pulmonary emboli   . Anxiety   . Endometriosis    Past Surgical History  Procedure Laterality Date  . Laparotomy    . Cesarean section  2007, 2009  . Abdominal hysterectomy    . Cholecystectomy    . Eye surgery    . Wisdom tooth extraction     History   Social History  . Marital Status: Single    Spouse Name: N/A  . Number of Children: N/A  . Years of Education: N/A   Occupational History  . Not on file.   Social History Main Topics  . Smoking status: Never Smoker   . Smokeless tobacco: Never Used  . Alcohol Use: Yes     Comment: Socially  . Drug  Use: No  . Sexual Activity:    Partners: Male   Other Topics Concern  . Not on file   Social History Narrative   Family History  Problem Relation Age of Onset  . Cancer Father   . Colon cancer Father   . Ovarian cancer Maternal Aunt    Patient Active Problem List   Diagnosis Date Noted  . Anxiety state 07/07/2014  . Endometriosis 07/07/2014  . Acute pulmonary embolism  5 wks post hysterectomy 07/07/2014  . S/P hysterectomy  Left ovary retained 07/07/2014   Current Outpatient Prescriptions on File Prior to Visit  Medication Sig Dispense Refill  . ciprofloxacin (CIPRO) 500 MG tablet Take 1 tablet (500 mg total) by mouth 2 (two) times daily. 20 tablet 0  . dicyclomine (BENTYL) 20 MG tablet Take 20 mg by mouth every 6 (six) hours as needed (IBS).    Marland Kitchen escitalopram (LEXAPRO) 20 MG tablet Take 20 mg by mouth daily.    Marland Kitchen ibuprofen (ADVIL,MOTRIN) 200 MG tablet Take 800 mg by mouth every 6 (six) hours as needed for pain.    . metroNIDAZOLE (FLAGYL) 500 MG tablet Take  1 tablet (500 mg total) by mouth 2 (two) times daily. 20 tablet 0  . oxyCODONE-acetaminophen (PERCOCET) 5-325 MG per tablet Take 1-2 tablets by mouth every 6 (six) hours as needed. 10 tablet 0   No current facility-administered medications on file prior to visit.       Review of Systems  Respiratory: Negative for cough, chest tightness, shortness of breath and wheezing.   Cardiovascular: Negative for chest pain, palpitations and leg swelling.  Gastrointestinal: Negative for abdominal pain.  All other systems reviewed and are negative.      Objective:   Physical Exam Physical Exam  Nursing note and vitals reviewed.  Constitutional: She is oriented to person, place, and time. She appears well-developed and well-nourished.  HENT:  Head: Normocephalic and atraumatic.  Right Ear: Tympanic membrane and ear Trujillo normal. No drainage. Tympanic membrane is not injected and not erythematous.  Left Ear: Tympanic membrane  and ear Trujillo normal. No drainage. Tympanic membrane is not injected and not erythematous.  Nose: Nose normal. Right sinus exhibits no maxillary sinus tenderness and no frontal sinus tenderness. Left sinus exhibits no maxillary sinus tenderness and no frontal sinus tenderness.  Mouth/Throat: Oropharynx is clear and moist. No oral lesions. No oropharyngeal exudate.  Eyes: Conjunctivae and EOM are normal. Pupils are equal, round, and reactive to light.  Neck: Normal range of motion. Neck supple. No JVD present. Carotid bruit is not present. No mass and no thyromegaly present.  Cardiovascular: Normal rate, regular rhythm, S1 normal, S2 normal and intact distal pulses. Exam reveals no gallop and no friction rub.  No murmur heard.  Pulses:  Carotid pulses are 2+ on the right side, and 2+ on the left side.  Dorsalis pedis pulses are 2+ on the right side, and 2+ on the left side.  No carotid bruit. No LE edema  Pulmonary/Chest: Breath sounds normal. She has no wheezes. She has no rales. She exhibits no tenderness.  Breast no discrete mass no nipple discharge no axillary adenopathy bilaterally Abdominal: Soft. Bowel sounds are normal. She exhibits no distension and no mass. There is no hepatosplenomegaly. There is no tenderness. There is no CVA tenderness.  Musculoskeletal: Normal range of motion.  No active synovitis to joints.  Lymphadenopathy:  She has no cervical adenopathy.  She has no axillary adenopathy.  Right: No inguinal and no supraclavicular adenopathy present.  Left: No inguinal and no supraclavicular adenopathy present.  Neurological: She is alert and oriented to person, place, and time. She has normal strength and normal reflexes. She displays no tremor. No cranial nerve deficit or sensory deficit. Coordination and gait normal.  Skin: Skin is warm and dry. No rash noted. No cyanosis. Nails show no clubbing.  Psychiatric: She has a normal mood and affect. Her speech is normal and behavior  is normal. Cognition and memory are normal.           Assessment & Plan:  HM  Will schedule 3D mm,   UTD with vaccines  See scanned sheet  Anxiety  OK to take 1/2 of 20 mg dose of Lexapro  Endometriosis  S/P  hyst /  R oopherecotmy and recent left  oopherecotmy  Will defer issue of HT to gyn onc  See me as needed  All labs today

## 2014-11-26 LAB — COMPLETE METABOLIC PANEL WITH GFR
ALK PHOS: 30 U/L — AB (ref 39–117)
ALT: 14 U/L (ref 0–35)
AST: 15 U/L (ref 0–37)
Albumin: 4.5 g/dL (ref 3.5–5.2)
BILIRUBIN TOTAL: 0.7 mg/dL (ref 0.2–1.2)
BUN: 14 mg/dL (ref 6–23)
CHLORIDE: 103 meq/L (ref 96–112)
CO2: 25 mEq/L (ref 19–32)
CREATININE: 0.66 mg/dL (ref 0.50–1.10)
Calcium: 8.8 mg/dL (ref 8.4–10.5)
GFR, Est African American: >89 mL/min
GFR, Est Non African American: >89 mL/min
Glucose, Bld: 83 mg/dL (ref 70–99)
Potassium: 4.2 mEq/L (ref 3.5–5.3)
Sodium: 140 mEq/L (ref 135–145)
Total Protein: 6.9 g/dL (ref 6.0–8.3)

## 2014-11-26 LAB — VITAMIN D 25 HYDROXY (VIT D DEFICIENCY, FRACTURES): Vit D, 25-Hydroxy: 28 ng/mL — ABNORMAL LOW (ref 30–100)

## 2014-11-26 LAB — LIPID PANEL
Cholesterol: 203 mg/dL — ABNORMAL HIGH (ref 0–200)
HDL: 62 mg/dL (ref >=46)
LDL Cholesterol: 122 mg/dL — ABNORMAL HIGH (ref 0–99)
Total CHOL/HDL Ratio: 3.3 Ratio
Triglycerides: 94 mg/dL (ref <150)
VLDL: 19 mg/dL (ref 0–40)

## 2014-11-26 LAB — URINALYSIS, MICROSCOPIC ONLY
Bacteria, UA: NONE SEEN
CASTS: NONE SEEN
CRYSTALS: NONE SEEN
SQUAMOUS EPITHELIAL / LPF: NONE SEEN

## 2014-12-09 ENCOUNTER — Ambulatory Visit: Payer: 59

## 2014-12-30 ENCOUNTER — Ambulatory Visit
Admission: RE | Admit: 2014-12-30 | Discharge: 2014-12-30 | Disposition: A | Discharge status: Home / Self Care | Payer: 59 | Source: Ambulatory Visit | Attending: Internal Medicine | Admitting: Internal Medicine

## 2014-12-30 ENCOUNTER — Other Ambulatory Visit: Payer: Self-pay | Admitting: *Deleted

## 2014-12-30 DIAGNOSIS — Z139 Encounter for screening, unspecified: Secondary | ICD-10-CM

## 2014-12-30 NOTE — Telephone Encounter (Signed)
I spoke with Whitney Trujillo in regards to her medication request and advised her to see her GYN - she voiced understanding -eh

## 2014-12-30 NOTE — Telephone Encounter (Signed)
Whitney Trujillo  If symptoms have not improved she needs to see her GYN MD

## 2014-12-30 NOTE — Telephone Encounter (Signed)
Pt called in stating she would like refill on Diflucan as Sx have not improved.

## 2015-01-01 ENCOUNTER — Other Ambulatory Visit: Payer: Self-pay | Admitting: Internal Medicine

## 2015-01-01 DIAGNOSIS — R928 Other abnormal and inconclusive findings on diagnostic imaging of breast: Secondary | ICD-10-CM

## 2015-01-05 ENCOUNTER — Ambulatory Visit
Admission: RE | Admit: 2015-01-05 | Discharge: 2015-01-05 | Disposition: A | Discharge status: Home / Self Care | Payer: 59 | Source: Ambulatory Visit | Attending: Internal Medicine | Admitting: Internal Medicine

## 2015-01-05 DIAGNOSIS — R928 Other abnormal and inconclusive findings on diagnostic imaging of breast: Secondary | ICD-10-CM

## 2015-02-01 ENCOUNTER — Encounter: Payer: Self-pay | Admitting: *Deleted

## 2015-02-01 ENCOUNTER — Telehealth: Payer: Self-pay | Admitting: *Deleted

## 2015-02-01 NOTE — Telephone Encounter (Signed)
Pre-Visit Call completed with patient and chart updated.   Pre-Visit Info documented in Specialty Comments under [employer].    

## 2015-02-02 ENCOUNTER — Encounter: Payer: Self-pay | Admitting: Family Medicine

## 2015-02-02 ENCOUNTER — Ambulatory Visit (INDEPENDENT_AMBULATORY_CARE_PROVIDER_SITE_OTHER): Payer: 59 | Admitting: Family Medicine

## 2015-02-02 VITALS — BP 92/66 | HR 77 | Temp 97.9°F | Resp 16 | Ht 59.0 in | Wt 116.0 lb

## 2015-02-02 DIAGNOSIS — F988 Other specified behavioral and emotional disorders with onset usually occurring in childhood and adolescence: Secondary | ICD-10-CM

## 2015-02-02 DIAGNOSIS — T7840XA Allergy, unspecified, initial encounter: Secondary | ICD-10-CM | POA: Insufficient documentation

## 2015-02-02 DIAGNOSIS — F909 Attention-deficit hyperactivity disorder, unspecified type: Secondary | ICD-10-CM

## 2015-02-02 DIAGNOSIS — N809 Endometriosis, unspecified: Secondary | ICD-10-CM | POA: Diagnosis not present

## 2015-02-02 DIAGNOSIS — F411 Generalized anxiety disorder: Secondary | ICD-10-CM

## 2015-02-02 DIAGNOSIS — E782 Mixed hyperlipidemia: Secondary | ICD-10-CM

## 2015-02-02 DIAGNOSIS — E559 Vitamin D deficiency, unspecified: Secondary | ICD-10-CM | POA: Diagnosis not present

## 2015-02-02 DIAGNOSIS — T7840XD Allergy, unspecified, subsequent encounter: Secondary | ICD-10-CM

## 2015-02-02 HISTORY — DX: Mixed hyperlipidemia: E78.2

## 2015-02-02 HISTORY — DX: Other specified behavioral and emotional disorders with onset usually occurring in childhood and adolescence: F98.8

## 2015-02-02 MED ORDER — ESCITALOPRAM OXALATE 10 MG PO TABS: 10.0000 mg | ORAL_TABLET | Freq: Every day | ORAL | Status: DC

## 2015-02-02 MED ORDER — ESTROGENS, CONJUGATED 0.625 MG/GM VA CREA: TOPICAL_CREAM | VAGINAL | Status: DC

## 2015-02-02 NOTE — Progress Notes (Signed)
Pre visit review using our clinic review tool, if applicable. No additional management support is needed unless otherwise documented below in the visit note. 

## 2015-02-02 NOTE — Patient Instructions (Signed)

## 2015-02-09 ENCOUNTER — Encounter: Payer: Self-pay | Admitting: *Deleted

## 2015-02-10 ENCOUNTER — Ambulatory Visit: Payer: 59 | Admitting: Internal Medicine

## 2015-02-14 MED ORDER — VITAMIN D 50 MCG (2000 UT) PO CAPS: 1.0000 | ORAL_CAPSULE | Freq: Every day | ORAL | Status: DC

## 2015-02-14 NOTE — Assessment & Plan Note (Signed)
Lexapro daily has been helpful, given a refill

## 2015-02-14 NOTE — Assessment & Plan Note (Signed)
Consider Flonase and/or antihistamine prn

## 2015-02-14 NOTE — Assessment & Plan Note (Signed)
Encouraged heart healthy diet, increase exercise, avoid trans fats, consider a krill oil cap daily 

## 2015-02-14 NOTE — Assessment & Plan Note (Signed)
Mild, continue 2000IU qd

## 2015-02-14 NOTE — Progress Notes (Signed)
Whitney Trujillo  [id number] [birth year] 02/14/2015      Progress Note-Follow Up  Subjective  Chief Complaint  Chief Complaint  Patient presents with  . Establish Care    Patient is in need of refills as well as discuss estrogen creams after hysterectomy    HPI  Patient is a 39 y.o. female in today for routine medical care. Patient is in today to establish care. She is in her usual state of health and offers no acute complaints. Is in need of a refill on her Lexapro, which treats her anxiety disorder well. She is noted to have had a pulmonary embolus after her pregnancy but is no longer on anticoagulation. She has recently undergone her second nephrectomy and has now essentially had a total hysterectomy due to endometriosis. She feels well and denies any abdominal pain or change in bowel or bladder habits. Denies CP/palp/SOB/HA/congestion/fevers/GI or GU c/o. Taking meds as prescribed  Past Medical History  Diagnosis Date  . Gallstones   . Pulmonary emboli     b/l PE after hysterectomy, 5 weeks post op, clotting problem ruled out at [facility]  . Anxiety   . Endometriosis   . Allergy     seasonal allergies  . Hyperlipidemia, mixed 02/02/2015  . ADD (attention deficit disorder) 02/02/2015    Past Surgical History  Procedure Laterality Date  . Laparotomy    . Cesarean section  2007, 2009  . Cholecystectomy    . Wisdom tooth extraction    . Cystoscopy  10/2014  . Abdominal hysterectomy      ovaries removed, in 2 separate procedures  . Eye surgery      lasik b/l    Family History  Problem Relation Age of Onset  . Cancer Father     Carcinoid Syndrome, colon  . Colon cancer Father   . Ovarian cancer Maternal Aunt   . Diabetes Mother     2, diet controlled  . ADD / ADHD Son   . Heart disease Maternal Grandmother   . Stroke Paternal Grandmother   . Dementia Paternal Grandmother   . COPD Paternal Grandfather     black lun, Forensic scientist, tobacco    History   Social  History  . Marital Status: Single    Spouse Name: N/A  . Number of Children: N/A  . Years of Education: N/A   Occupational History  . Not on file.   Social History Main Topics  . Smoking status: Never Smoker   . Smokeless tobacco: Never Used  . Alcohol Use: Yes     Comment: Socially  . Drug Use: No  . 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 Not on file   Social History Narrative    Current Outpatient Prescriptions on File Prior to Visit  Medication Sig Dispense Refill  . ibuprofen (ADVIL,MOTRIN) 200 MG tablet Take 800 mg by mouth every 6 (six) hours as needed for pain.    . Omega-3 1000 MG CAPS Take 1 g by mouth.    . Probiotic Product (PROBIOTIC + OMEGA-3 PO) Take 1 capsule by mouth.     No current facility-administered medications on file prior to visit.    Allergies  Allergen Reactions  . Latex Itching    Review of Systems  Review of Systems  Constitutional: Negative for fever, chills and malaise/fatigue.  HENT: Negative for congestion, hearing loss and nosebleeds.   Eyes: Negative for discharge.  Respiratory: Negative for cough, sputum production, shortness of breath and wheezing.   Cardiovascular: Negative for chest pain, palpitations and leg swelling.  Gastrointestinal: Negative for heartburn, nausea, vomiting, abdominal pain, diarrhea, constipation and blood in stool.  Genitourinary: Negative for dysuria, urgency, frequency and hematuria.  Musculoskeletal: Negative for myalgias, back pain and falls.  Skin: Negative for rash.  Neurological: Negative for dizziness, tremors, sensory change, focal weakness, loss of consciousness, weakness and headaches.  Endo/Heme/Allergies: Negative for polydipsia. Does not bruise/bleed easily.  Psychiatric/Behavioral: Negative for depression and suicidal ideas. The patient is not nervous/anxious and does  not have insomnia.     Objective  BP 92/66 mmHg  Pulse 77  Temp(Src) 97.9 F (36.6 C) (Oral)  Resp 16  Ht 4\' 11"  (1.499 m)  Wt 116 lb (52.617 kg)  BMI 23.42 kg/m2  SpO2 98%  Physical Exam  Physical Exam  Constitutional: She is oriented to person, place, and time and well-developed, well-nourished, and in no distress. No distress.  HENT:  Head: Normocephalic and atraumatic.  Right Ear: External ear normal.  Left Ear: External ear normal.  Nose: Nose normal.  Mouth/Throat: Oropharynx is clear and moist. No oropharyngeal exudate.  Eyes: Conjunctivae are normal. Pupils are equal, round, and reactive to light. Right eye exhibits no discharge. Left eye exhibits no discharge. No scleral icterus.  Neck: Normal range of motion. Neck supple. No thyromegaly present.  Cardiovascular: Normal rate, regular rhythm, normal heart sounds and intact distal pulses.   No murmur heard. Pulmonary/Chest: Effort normal and breath sounds normal. No respiratory distress. She has no wheezes. She has no rales.  Abdominal: Soft. Bowel sounds are normal. She exhibits no distension and no mass. There is no tenderness.  Musculoskeletal: Normal range of motion. She exhibits no edema or tenderness.  Lymphadenopathy:    She has no cervical adenopathy.  Neurological: She is alert and oriented to person, place, and time. She has normal reflexes. No cranial nerve deficit. Coordination normal.  Skin: Skin is warm and dry. No rash noted. She is not diaphoretic.  Psychiatric: Mood, memory and affect normal.    Lab Results  Component Value Date   TSH 1.222 11/24/2014   Lab Results  Component Value Date   WBC 6.5 11/24/2014   HGB 14.1 11/24/2014   HCT 43.1 11/24/2014   MCV 90.5 11/24/2014   PLT 313 11/24/2014   Lab Results  Component Value Date   CREATININE 0.66 11/24/2014   BUN 14 11/24/2014   NA 140 11/24/2014   K 4.2 11/24/2014   CL 103 11/24/2014   CO2 25 11/24/2014   Lab Results  Component  Value Date   ALT 14 11/24/2014   AST 15 11/24/2014   ALKPHOS 30* 11/24/2014   BILITOT 0.7 11/24/2014   Lab Results  Component Value Date   CHOL 203* 11/24/2014   Lab Results  Component Value Date   HDL 62 11/24/2014   Lab Results  Component Value Date   LDLCALC 122* 11/24/2014   Lab Results  Component Value Date   TRIG 94 11/24/2014   Lab Results  Component Value Date   CHOLHDL 3.3 11/24/2014     Assessment & Plan  Vitamin D deficiency Mild, continue 2000IU qd   Anxiety state Lexapro daily has been helpful, given a refill   Hyperlipidemia, mixed Encouraged heart healthy diet, increase exercise, avoid trans fats, consider a krill oil cap daily  Allergy Consider Flonase and/or antihistamine prn

## 2015-08-22 ENCOUNTER — Ambulatory Visit (HOSPITAL_BASED_OUTPATIENT_CLINIC_OR_DEPARTMENT_OTHER)
Admission: RE | Admit: 2015-08-22 | Discharge: 2015-08-22 | Disposition: A | Discharge status: Home / Self Care | Payer: 59 | Source: Ambulatory Visit | Attending: Family Medicine | Admitting: Family Medicine

## 2015-08-22 ENCOUNTER — Other Ambulatory Visit (HOSPITAL_BASED_OUTPATIENT_CLINIC_OR_DEPARTMENT_OTHER): Payer: Self-pay | Admitting: Emergency Medicine

## 2015-08-22 DIAGNOSIS — R109 Unspecified abdominal pain: Secondary | ICD-10-CM | POA: Insufficient documentation

## 2015-08-22 DIAGNOSIS — N200 Calculus of kidney: Secondary | ICD-10-CM | POA: Insufficient documentation

## 2015-11-11 IMAGING — MG MM SCREENING BREAST TOMO BILATERAL
8 series · 9 of 24 positions shown · non-contrast
Comparison: Previous exam(s).

CLINICAL DATA: Screening.

EXAM:
DIGITAL SCREENING BILATERAL MAMMOGRAM WITH 3D TOMO WITH CAD

[R CC]
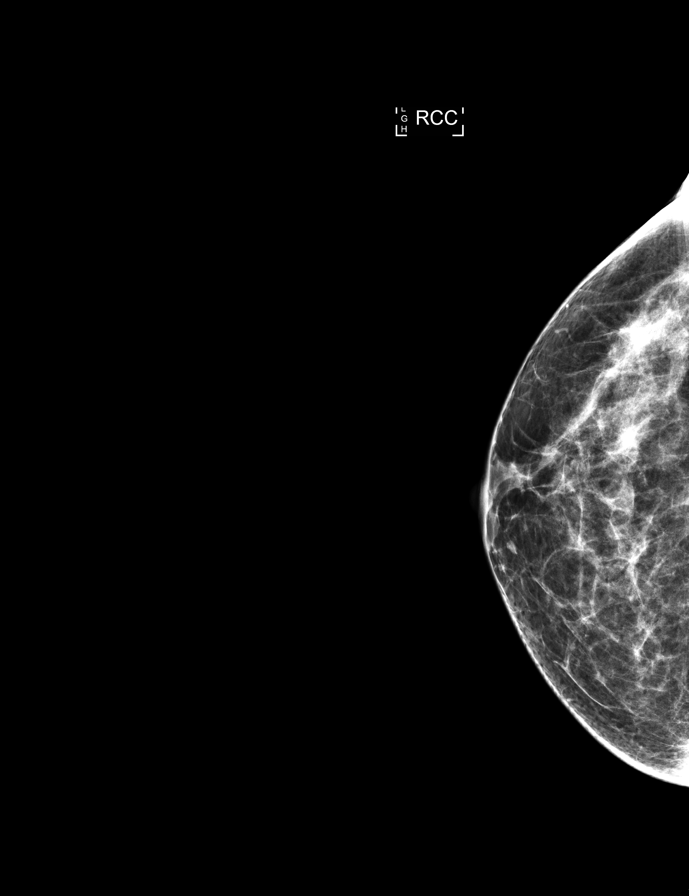

[L CC]
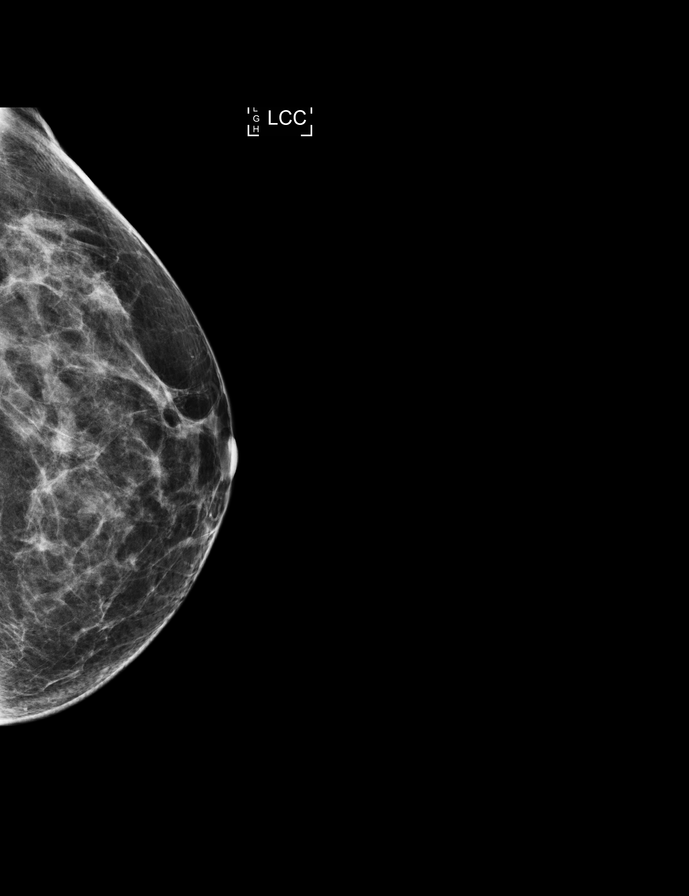

[L MLO]
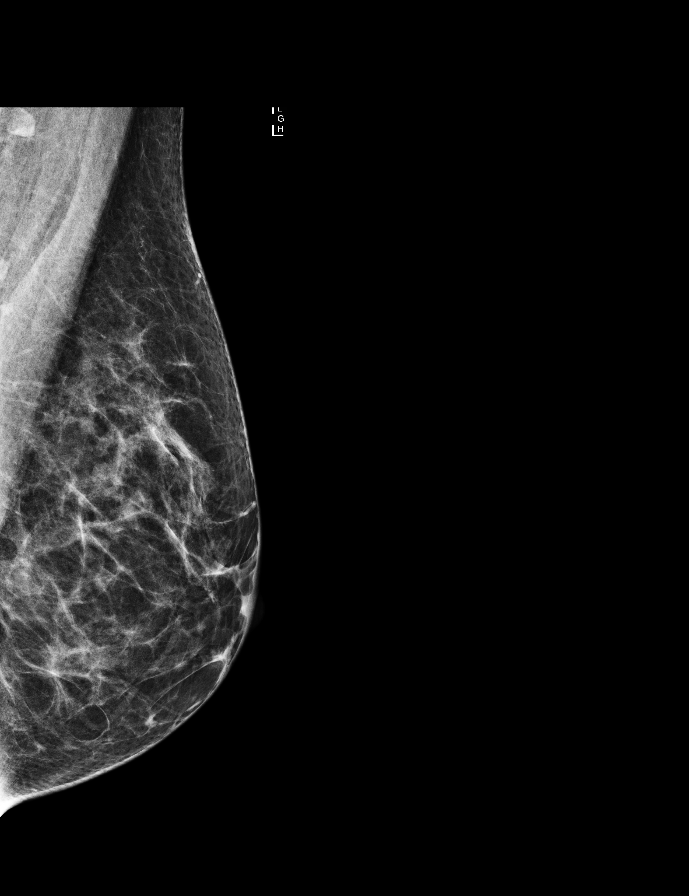

[R MLO]
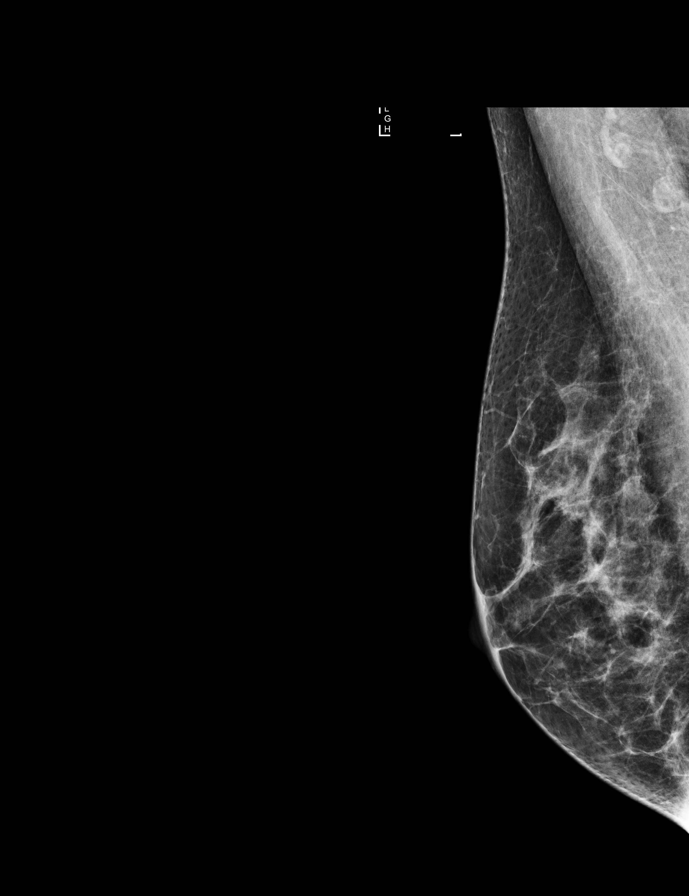

[L CC tomo · 2 of 67 frames shown]
[frame 22/67]
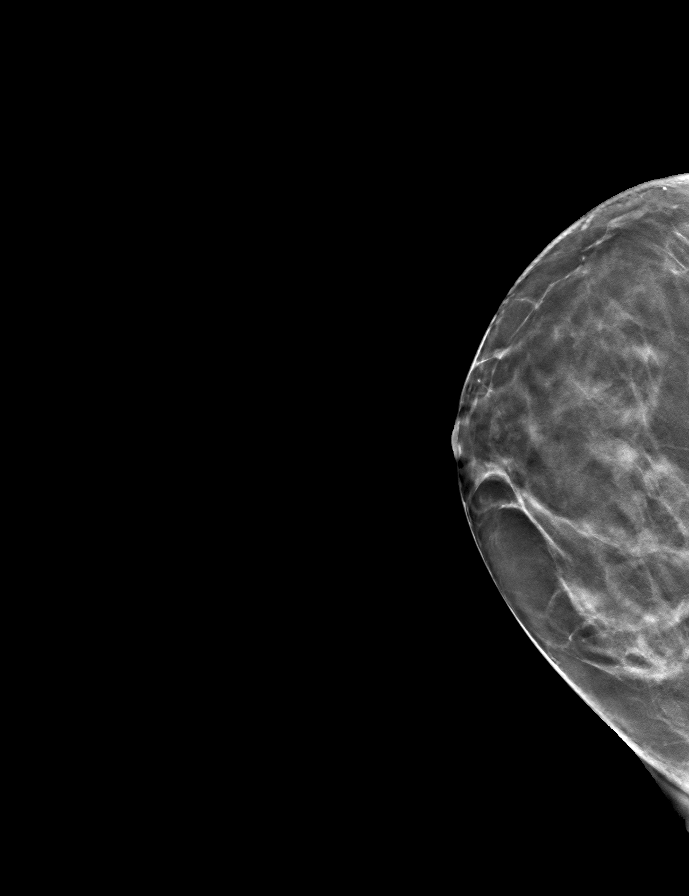
[frame 34/67]
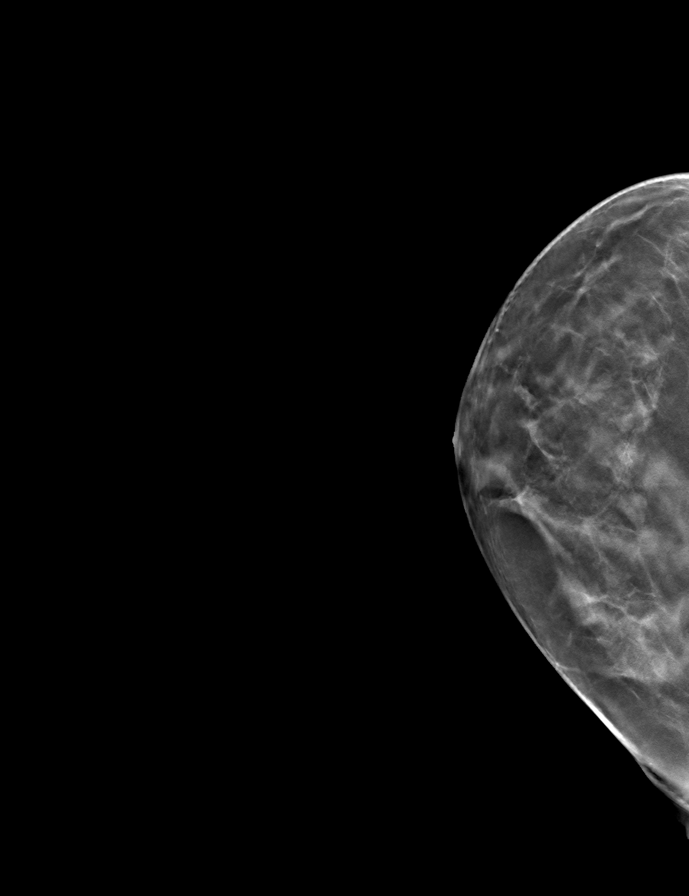

[R MLO tomo · tomo slice 32/63.0]
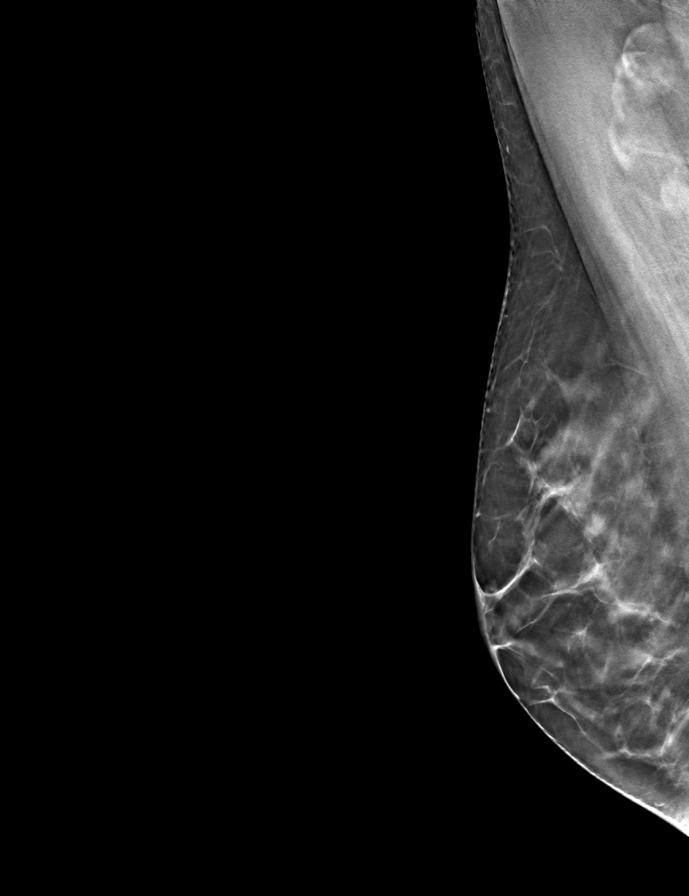

[L MLO tomo · tomo slice 32/63.0]
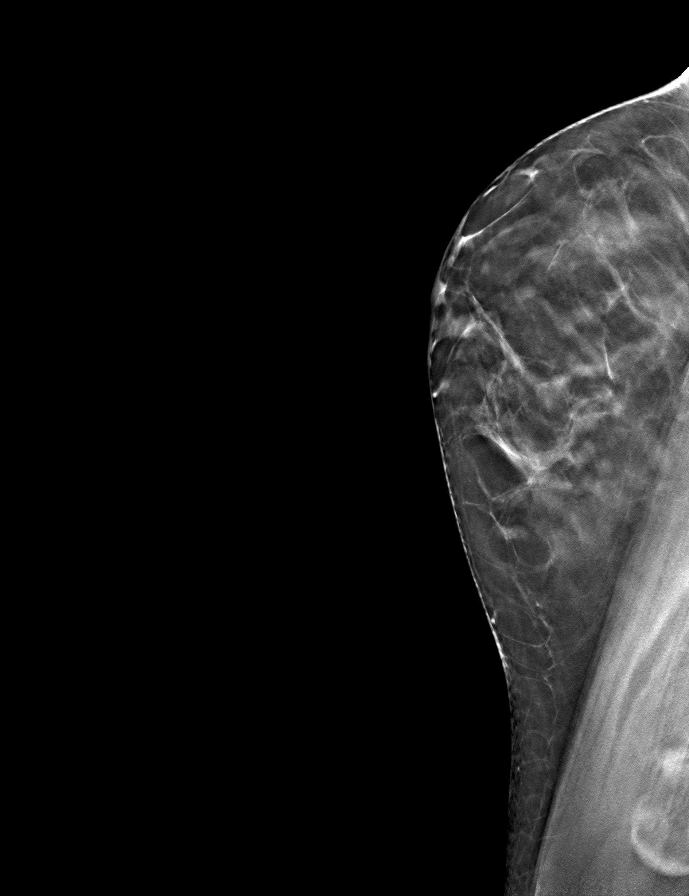

[R CC tomo · tomo slice 31/60.0]
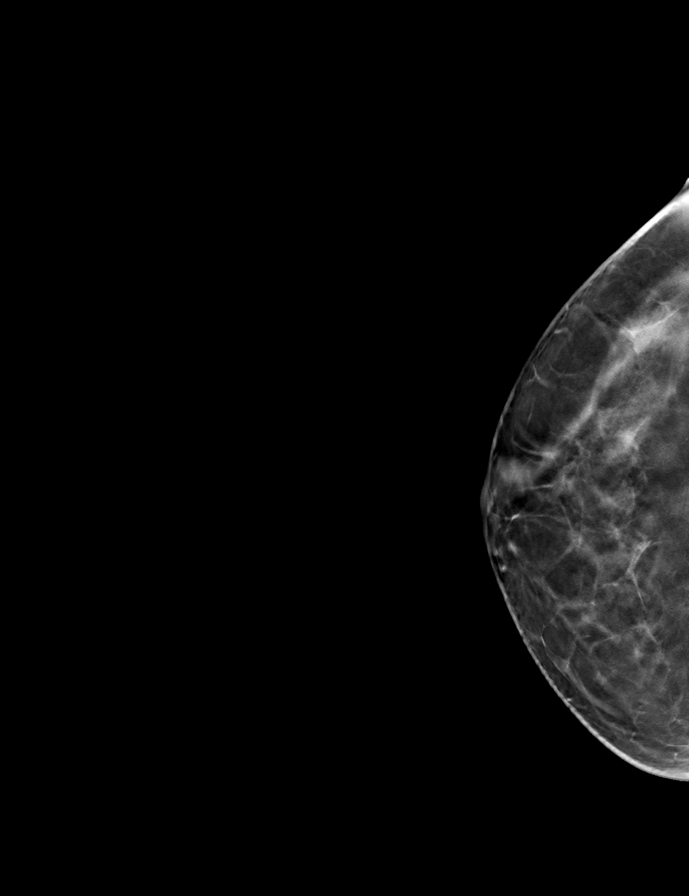

[9 of 24 positions shown; findings below may reference images not displayed]

ACR Breast Density Category b: There are scattered areas of
fibroglandular density.
FINDINGS: In the left breast, a possible mass warrants further evaluation. In
the right breast, no findings suspicious for malignancy. Images were
processed with CAD.
IMPRESSION: Further evaluation is suggested for possible mass in the left
breast.

RECOMMENDATION:
Ultrasound of the left breast. (Code:WX-8-FFA)

The patient will be contacted regarding the findings, and additional
imaging will be scheduled.

BI-RADS CATEGORY  0: Incomplete. Need additional imaging evaluation
and/or prior mammograms for comparison.

## 2015-12-16 ENCOUNTER — Encounter: Payer: Self-pay | Admitting: Family Medicine

## 2015-12-16 ENCOUNTER — Ambulatory Visit (INDEPENDENT_AMBULATORY_CARE_PROVIDER_SITE_OTHER): Payer: Commercial Managed Care - HMO | Admitting: Family Medicine

## 2015-12-16 VITALS — BP 119/76 | HR 67 | Temp 98.0°F | Ht 59.0 in | Wt 121.0 lb

## 2015-12-16 DIAGNOSIS — Z0000 Encounter for general adult medical examination without abnormal findings: Secondary | ICD-10-CM | POA: Diagnosis not present

## 2015-12-16 DIAGNOSIS — E559 Vitamin D deficiency, unspecified: Secondary | ICD-10-CM

## 2015-12-16 DIAGNOSIS — Z0001 Encounter for general adult medical examination with abnormal findings: Secondary | ICD-10-CM

## 2015-12-16 DIAGNOSIS — G43809 Other migraine, not intractable, without status migrainosus: Secondary | ICD-10-CM | POA: Diagnosis not present

## 2015-12-16 DIAGNOSIS — E782 Mixed hyperlipidemia: Secondary | ICD-10-CM

## 2015-12-16 DIAGNOSIS — Z1211 Encounter for screening for malignant neoplasm of colon: Secondary | ICD-10-CM | POA: Insufficient documentation

## 2015-12-16 DIAGNOSIS — G43909 Migraine, unspecified, not intractable, without status migrainosus: Secondary | ICD-10-CM

## 2015-12-16 HISTORY — DX: Migraine, unspecified, not intractable, without status migrainosus: G43.909

## 2015-12-16 HISTORY — DX: Encounter for general adult medical examination without abnormal findings: Z00.00

## 2015-12-16 HISTORY — DX: Encounter for screening for malignant neoplasm of colon: Z12.11

## 2015-12-16 LAB — CBC
HEMATOCRIT: 41.4 % (ref 36.0–46.0)
Hemoglobin: 14.3 g/dL (ref 12.0–15.0)
MCHC: 34.4 g/dL (ref 30.0–36.0)
MCV: 85 fl (ref 78.0–100.0)
Platelets: 251 10*3/uL (ref 150.0–400.0)
RBC: 4.87 Mil/uL (ref 3.87–5.11)
RDW: 12.7 % (ref 11.5–15.5)
WBC: 6.5 10*3/uL (ref 4.0–10.5)

## 2015-12-16 LAB — COMPREHENSIVE METABOLIC PANEL
ALT: 13 U/L (ref 0–35)
AST: 16 U/L (ref 0–37)
Albumin: 4.5 g/dL (ref 3.5–5.2)
Alkaline Phosphatase: 47 U/L (ref 39–117)
BUN: 13 mg/dL (ref 6–23)
CHLORIDE: 104 meq/L (ref 96–112)
CO2: 29 mEq/L (ref 19–32)
Calcium: 9.7 mg/dL (ref 8.4–10.5)
Creatinine, Ser: 0.67 mg/dL (ref 0.40–1.20)
GFR: 102.87 mL/min (ref >60.00)
GLUCOSE: 95 mg/dL (ref 70–99)
POTASSIUM: 3.6 meq/L (ref 3.5–5.1)
SODIUM: 140 meq/L (ref 135–145)
TOTAL PROTEIN: 7.6 g/dL (ref 6.0–8.3)
Total Bilirubin: 1.1 mg/dL (ref 0.2–1.2)

## 2015-12-16 LAB — LIPID PANEL
Cholesterol: 197 mg/dL (ref 0–200)
HDL: 68.3 mg/dL (ref >39.00)
LDL CALC: 109 mg/dL — AB (ref 0–99)
NONHDL: 128.92
Total CHOL/HDL Ratio: 3
Triglycerides: 100 mg/dL (ref 0.0–149.0)
VLDL: 20 mg/dL (ref 0.0–40.0)

## 2015-12-16 MED ORDER — RIZATRIPTAN BENZOATE 10 MG PO TABS: 10.0000 mg | ORAL_TABLET | ORAL | Status: DC | PRN

## 2015-12-16 NOTE — Assessment & Plan Note (Addendum)
Encouraged moist heat and gentle stretching as tolerated. May try NSAIDs and prescription meds as directed and report if symptoms worsen or seek immediate care. Did not tolerate Imitrex but will try Maxalt today

## 2015-12-16 NOTE — Patient Instructions (Signed)
[email or web address] 10 strain cap 1 daily or down stairs   Preventive Care for Adults, Female A healthy lifestyle and preventive care can promote health and wellness. Preventive health guidelines for women include the following key practices.  A routine yearly physical is a good way to check with your health care provider about your health and preventive screening. It is a chance to share any concerns and updates on your health and to receive a thorough exam.  Visit your dentist for a routine exam and preventive care every 6 months. Brush your teeth twice a day and floss once a day. Good oral hygiene prevents tooth decay and gum disease.  The frequency of eye exams is based on your age, health, family medical history, use of contact lenses, and other factors. Follow your health care provider's recommendations for frequency of eye exams.  Eat a healthy diet. Foods like vegetables, fruits, whole grains, low-fat dairy products, and lean protein foods contain the nutrients you need without too many calories. Decrease your intake of foods high in solid fats, added sugars, and salt. Eat the right amount of calories for you.Get information about a proper diet from your health care provider, if necessary.  Regular physical exercise is one of the most important things you can do for your health. Most adults should get at least 150 minutes of moderate-intensity exercise (any activity that increases your heart rate and causes you to sweat) each week. In addition, most adults need muscle-strengthening exercises on 2 or more days a week.  Maintain a healthy weight. The body mass index (BMI) is a screening tool to identify possible weight problems. It provides an estimate of body fat based on height and weight. Your health care provider can find your BMI and can help you achieve or maintain a healthy weight.For adults 20 years and older:  A BMI below 18.5 is considered underweight.  A BMI of 18.5  to 24.9 is normal.  A BMI of 25 to 29.9 is considered overweight.  A BMI of 30 and above is considered obese.  Maintain normal blood lipids and cholesterol levels by exercising and minimizing your intake of saturated fat. Eat a balanced diet with plenty of fruit and vegetables. Blood tests for lipids and cholesterol should begin at age 5 and be repeated every 5 years. If your lipid or cholesterol levels are high, you are over 50, or you are at high risk for heart disease, you may need your cholesterol levels checked more frequently.Ongoing high lipid and cholesterol levels should be treated with medicines if diet and exercise are not working.  If you smoke, find out from your health care provider how to quit. If you do not use tobacco, do not start.  Lung cancer screening is recommended for adults aged 77-80 years who are at high risk for developing lung cancer because of a history of smoking. A yearly low-dose CT scan of the lungs is recommended for people who have at least a 30-pack-year history of smoking and are a current smoker or have quit within the past 15 years. A pack year of smoking is smoking an average of 1 pack of cigarettes a day for 1 year (for example: 1 pack a day for 30 years or 2 packs a day for 15 years). Yearly screening should continue until the smoker has stopped smoking for at least 15 years. Yearly screening should be stopped for people who develop a health problem that would prevent them from having lung  cancer treatment.  If you are pregnant, do not drink alcohol. If you are breastfeeding, be very cautious about drinking alcohol. If you are not pregnant and choose to drink alcohol, do not have more than 1 drink per day. One drink is considered to be 12 ounces (355 mL) of beer, 5 ounces (148 mL) of wine, or 1.5 ounces (44 mL) of liquor.  Avoid use of street drugs. Do not share needles with anyone. Ask for help if you need support or instructions about stopping the use of  drugs.  High blood pressure causes heart disease and increases the risk of stroke. Your blood pressure should be checked at least every 1 to 2 years. Ongoing high blood pressure should be treated with medicines if weight loss and exercise do not work.  If you are 55-79 years old, ask your health care provider if you should take aspirin to prevent strokes.  Diabetes screening is done by taking a blood sample to check your blood glucose level after you have not eaten for a certain period of time (fasting). If you are not overweight and you do not have risk factors for diabetes, you should be screened once every 3 years starting at age 45. If you are overweight or obese and you are 40-70 years of age, you should be screened for diabetes every year as part of your cardiovascular risk assessment.  Breast cancer screening is essential preventive care for women. You should practice "breast self-awareness." This means understanding the normal appearance and feel of your breasts and may include breast self-examination. Any changes detected, no matter how small, should be reported to a health care provider. Women in their 20s and 30s should have a clinical breast exam (CBE) by a health care provider as part of a regular health exam every 1 to 3 years. After age 40, women should have a CBE every year. Starting at age 40, women should consider having a mammogram (breast X-ray test) every year. Women who have a family history of breast cancer should talk to their health care provider about genetic screening. Women at a high risk of breast cancer should talk to their health care providers about having an MRI and a mammogram every year.  Breast cancer gene (BRCA)-related cancer risk assessment is recommended for women who have family members with BRCA-related cancers. BRCA-related cancers include breast, ovarian, tubal, and peritoneal cancers. Having family members with these cancers may be associated with an increased  risk for harmful changes (mutations) in the breast cancer genes BRCA1 and BRCA2. Results of the assessment will determine the need for genetic counseling and BRCA1 and BRCA2 testing.  Your health care provider may recommend that you be screened regularly for cancer of the pelvic organs (ovaries, uterus, and vagina). This screening involves a pelvic examination, including checking for microscopic changes to the surface of your cervix (Pap test). You may be encouraged to have this screening done every 3 years, beginning at age 21.  For women ages 30-65, health care providers may recommend pelvic exams and Pap testing every 3 years, or they may recommend the Pap and pelvic exam, combined with testing for human papilloma virus (HPV), every 5 years. Some types of HPV increase your risk of cervical cancer. Testing for HPV may also be done on women of any age with unclear Pap test results.  Other health care providers may not recommend any screening for nonpregnant women who are considered low risk for pelvic cancer and who do not have   symptoms. Ask your health care provider if a screening pelvic exam is right for you.  If you have had past treatment for cervical cancer or a condition that could lead to cancer, you need Pap tests and screening for cancer for at least 20 years after your treatment. If Pap tests have been discontinued, your risk factors (such as having a new sexual partner) need to be reassessed to determine if screening should resume. Some women have medical problems that increase the chance of getting cervical cancer. In these cases, your health care provider may recommend more frequent screening and Pap tests.  Colorectal cancer can be detected and often prevented. Most routine colorectal cancer screening begins at the age of 69 years and continues through age 60 years. However, your health care provider may recommend screening at an earlier age if you have risk factors for colon cancer. On a  yearly basis, your health care provider may provide home test kits to check for hidden blood in the stool. Use of a small camera at the end of a tube, to directly examine the colon (sigmoidoscopy or colonoscopy), can detect the earliest forms of colorectal cancer. Talk to your health care provider about this at age 26, when routine screening begins. Direct exam of the colon should be repeated every 5-10 years through age 45 years, unless early forms of precancerous polyps or small growths are found.  People who are at an increased risk for hepatitis B should be screened for this virus. You are considered at high risk for hepatitis B if:  You were born in a country where hepatitis B occurs often. Talk with your health care provider about which countries are considered high risk.  Your parents were born in a high-risk country and you have not received a shot to protect against hepatitis B (hepatitis B vaccine).  You have HIV or AIDS.  You use needles to inject street drugs.  You live with, or have sex with, someone who has hepatitis B.  You get hemodialysis treatment.  You take certain medicines for conditions like cancer, organ transplantation, and autoimmune conditions.  Hepatitis C blood testing is recommended for all people born from [birth year] through 1965 and any individual with known risks for hepatitis C.  Practice safe sex. Use condoms and avoid high-risk sexual practices to reduce the spread of sexually transmitted infections (STIs). STIs include gonorrhea, chlamydia, syphilis, trichomonas, herpes, HPV, and human immunodeficiency virus (HIV). Herpes, HIV, and HPV are viral illnesses that have no cure. They can result in disability, cancer, and death.  You should be screened for sexually transmitted illnesses (STIs) including gonorrhea and chlamydia if:  You are sexually active and are younger than 24 years.  You are older than 24 years and your health care provider tells you that you are  at risk for this type of infection.  Your sexual activity has changed since you were last screened and you are at an increased risk for chlamydia or gonorrhea. Ask your health care provider if you are at risk.  If you are at risk of being infected with HIV, it is recommended that you take a prescription medicine daily to prevent HIV infection. This is called preexposure prophylaxis (PrEP). You are considered at risk if:  You are sexually active and do not regularly use condoms or know the HIV status of your partner(s).  You take drugs by injection.  You are sexually active with a partner who has HIV.  Talk with your health care  provider about whether you are at high risk of being infected with HIV. If you choose to begin PrEP, you should first be tested for HIV. You should then be tested every 3 months for as long as you are taking PrEP.  Osteoporosis is a disease in which the bones lose minerals and strength with aging. This can result in serious bone fractures or breaks. The risk of osteoporosis can be identified using a bone density scan. Women ages 65 years and over and women at risk for fractures or osteoporosis should discuss screening with their health care providers. Ask your health care provider whether you should take a calcium supplement or vitamin D to reduce the rate of osteoporosis.  Menopause can be associated with physical symptoms and risks. Hormone replacement therapy is available to decrease symptoms and risks. You should talk to your health care provider about whether hormone replacement therapy is right for you.  Use sunscreen. Apply sunscreen liberally and repeatedly throughout the day. You should seek shade when your shadow is shorter than you. Protect yourself by wearing long sleeves, pants, a wide-brimmed hat, and sunglasses year round, whenever you are outdoors.  Once a month, do a whole body skin exam, using a mirror to look at the skin on your back. Tell your health  care provider of new moles, moles that have irregular borders, moles that are larger than a pencil eraser, or moles that have changed in shape or color.  Stay current with required vaccines (immunizations).  Influenza vaccine. All adults should be immunized every year.  Tetanus, diphtheria, and acellular pertussis (Td, Tdap) vaccine. Pregnant women should receive 1 dose of Tdap vaccine during each pregnancy. The dose should be obtained regardless of the length of time since the last dose. Immunization is preferred during the 27th-36th week of gestation. An adult who has not previously received Tdap or who does not know her vaccine status should receive 1 dose of Tdap. This initial dose should be followed by tetanus and diphtheria toxoids (Td) booster doses every 10 years. Adults with an unknown or incomplete history of completing a 3-dose immunization series with Td-containing vaccines should begin or complete a primary immunization series including a Tdap dose. Adults should receive a Td booster every 10 years.  Varicella vaccine. An adult without evidence of immunity to varicella should receive 2 doses or a second dose if she has previously received 1 dose. Pregnant females who do not have evidence of immunity should receive the first dose after pregnancy. This first dose should be obtained before leaving the health care facility. The second dose should be obtained 4-8 weeks after the first dose.  Human papillomavirus (HPV) vaccine. Females aged 13-26 years who have not received the vaccine previously should obtain the 3-dose series. The vaccine is not recommended for use in pregnant females. However, pregnancy testing is not needed before receiving a dose. If a female is found to be pregnant after receiving a dose, no treatment is needed. In that case, the remaining doses should be delayed until after the pregnancy. Immunization is recommended for any person with an immunocompromised condition through  the age of 26 years if she did not get any or all doses earlier. During the 3-dose series, the second dose should be obtained 4-8 weeks after the first dose. The third dose should be obtained 24 weeks after the first dose and 16 weeks after the second dose.  Zoster vaccine. One dose is recommended for adults aged 60 years or older   unless certain conditions are present.  Measles, mumps, and rubella (MMR) vaccine. Adults born before [birth year] generally are considered immune to measles and mumps. Adults born in [birth year] or later should have 1 or more doses of MMR vaccine unless there is a contraindication to the vaccine or there is laboratory evidence of immunity to each of the three diseases. A routine second dose of MMR vaccine should be obtained at least 28 days after the first dose for students attending postsecondary schools, health care workers, or international travelers. People who received inactivated measles vaccine or an unknown type of measles vaccine during 1963-1967 should receive 2 doses of MMR vaccine. People who received inactivated mumps vaccine or an unknown type of mumps vaccine before 1979 and are at high risk for mumps infection should consider immunization with 2 doses of MMR vaccine. For females of childbearing age, rubella immunity should be determined. If there is no evidence of immunity, females who are not pregnant should be vaccinated. If there is no evidence of immunity, females who are pregnant should delay immunization until after pregnancy. Unvaccinated health care workers born before [birth year] who lack laboratory evidence of measles, mumps, or rubella immunity or laboratory confirmation of disease should consider measles and mumps immunization with 2 doses of MMR vaccine or rubella immunization with 1 dose of MMR vaccine.  Pneumococcal 13-valent conjugate (PCV13) vaccine. When indicated, a person who is uncertain of his immunization history and has no record of immunization should receive the  PCV13 vaccine. All adults 67 years of age and older should receive this vaccine. An adult aged 15 years or older who has certain medical conditions and has not been previously immunized should receive 1 dose of PCV13 vaccine. This PCV13 should be followed with a dose of pneumococcal polysaccharide (PPSV23) vaccine. Adults who are at high risk for pneumococcal disease should obtain the PPSV23 vaccine at least 8 weeks after the dose of PCV13 vaccine. Adults older than 42 years of age who have normal immune system function should obtain the PPSV23 vaccine dose at least 1 year after the dose of PCV13 vaccine.  Pneumococcal polysaccharide (PPSV23) vaccine. When PCV13 is also indicated, PCV13 should be obtained first. All adults aged 68 years and older should be immunized. An adult younger than age 24 years who has certain medical conditions should be immunized. Any person who resides in a nursing home or long-term care facility should be immunized. An adult smoker should be immunized. People with an immunocompromised condition and certain other conditions should receive both PCV13 and PPSV23 vaccines. People with human immunodeficiency virus (HIV) infection should be immunized as soon as possible after diagnosis. Immunization during chemotherapy or radiation therapy should be avoided. Routine use of PPSV23 vaccine is not recommended for American Indians, [place] Natives, or people younger than 65 years unless there are medical conditions that require PPSV23 vaccine. When indicated, people who have unknown immunization and have no record of immunization should receive PPSV23 vaccine. One-time revaccination 5 years after the first dose of PPSV23 is recommended for people aged 19-64 years who have chronic kidney failure, nephrotic syndrome, asplenia, or immunocompromised conditions. People who received 1-2 doses of PPSV23 before age 11 years should receive another dose of PPSV23 vaccine at age 25 years or later if at least  5 years have passed since the previous dose. Doses of PPSV23 are not needed for people immunized with PPSV23 at or after age 12 years.  Meningococcal vaccine. Adults with asplenia or persistent complement component deficiencies should  receive 2 doses of quadrivalent meningococcal conjugate (MenACWY-D) vaccine. The doses should be obtained at least 2 months apart. Microbiologists working with certain meningococcal bacteria, [place] recruits, people at risk during an outbreak, and people who travel to or live in countries with a high rate of meningitis should be immunized. A first-year college student up through age 8 years who is living in a residence hall should receive a dose if she did not receive a dose on or after her 16th birthday. Adults who have certain high-risk conditions should receive one or more doses of vaccine.  Hepatitis A vaccine. Adults who wish to be protected from this disease, have certain high-risk conditions, work with hepatitis A-infected animals, work in hepatitis A research labs, or travel to or work in countries with a high rate of hepatitis A should be immunized. Adults who were previously unvaccinated and who anticipate close contact with an international adoptee during the first 60 days after arrival in the [place] from a country with a high rate of hepatitis A should be immunized.  Hepatitis B vaccine. Adults who wish to be protected from this disease, have certain high-risk conditions, may be exposed to blood or other infectious body fluids, are household contacts or sex partners of hepatitis B positive people, are clients or workers in certain care facilities, or travel to or work in countries with a high rate of hepatitis B should be immunized.  Haemophilus influenzae type b (Hib) vaccine. A previously unvaccinated person with asplenia or sickle cell disease or having a scheduled splenectomy should receive 1 dose of Hib vaccine. Regardless of previous immunization, a  recipient of a hematopoietic stem cell transplant should receive a 3-dose series 6-12 months after her successful transplant. Hib vaccine is not recommended for adults with HIV infection. Preventive Services / Frequency Ages 11 to 51 years  Blood pressure check.** / Every 3-5 years.  Lipid and cholesterol check.** / Every 5 years beginning at age 45.  Clinical breast exam.** / Every 3 years for women in their 81s and 31s.  BRCA-related cancer risk assessment.** / For women who have family members with a BRCA-related cancer (breast, ovarian, tubal, or peritoneal cancers).  Pap test.** / Every 2 years from ages 53 through 34. Every 3 years starting at age 16 through age 79 or 47 with a history of 3 consecutive normal Pap tests.  HPV screening.** / Every 3 years from ages 79 through ages 79 to 86 with a history of 3 consecutive normal Pap tests.  Hepatitis C blood test.** / For any individual with known risks for hepatitis C.  Skin self-exam. / Monthly.  Influenza vaccine. / Every year.  Tetanus, diphtheria, and acellular pertussis (Tdap, Td) vaccine.** / Consult your health care provider. Pregnant women should receive 1 dose of Tdap vaccine during each pregnancy. 1 dose of Td every 10 years.  Varicella vaccine.** / Consult your health care provider. Pregnant females who do not have evidence of immunity should receive the first dose after pregnancy.  HPV vaccine. / 3 doses over 6 months, if 53 and younger. The vaccine is not recommended for use in pregnant females. However, pregnancy testing is not needed before receiving a dose.  Measles, mumps, rubella (MMR) vaccine.** / You need at least 1 dose of MMR if you were born in 1957 or later. You may also need a 2nd dose. For females of childbearing age, rubella immunity should be determined. If there is no evidence of immunity, females who are not  pregnant should be vaccinated. If there is no evidence of immunity, females who are pregnant  should delay immunization until after pregnancy.  Pneumococcal 13-valent conjugate (PCV13) vaccine.** / Consult your health care provider.  Pneumococcal polysaccharide (PPSV23) vaccine.** / 1 to 2 doses if you smoke cigarettes or if you have certain conditions.  Meningococcal vaccine.** / 1 dose if you are age 19 to 21 years and a first-year college student living in a residence hall, or have one of several medical conditions, you need to get vaccinated against meningococcal disease. You may also need additional booster doses.  Hepatitis A vaccine.** / Consult your health care provider.  Hepatitis B vaccine.** / Consult your health care provider.  Haemophilus influenzae type b (Hib) vaccine.** / Consult your health care provider. Ages 40 to 64 years  Blood pressure check.** / Every year.  Lipid and cholesterol check.** / Every 5 years beginning at age 20 years.  Lung cancer screening. / Every year if you are aged 55-80 years and have a 30-pack-year history of smoking and currently smoke or have quit within the past 15 years. Yearly screening is stopped once you have quit smoking for at least 15 years or develop a health problem that would prevent you from having lung cancer treatment.  Clinical breast exam.** / Every year after age 40 years.  BRCA-related cancer risk assessment.** / For women who have family members with a BRCA-related cancer (breast, ovarian, tubal, or peritoneal cancers).  Mammogram.** / Every year beginning at age 40 years and continuing for as long as you are in good health. Consult with your health care provider.  Pap test.** / Every 3 years starting at age 30 years through age 65 or 70 years with a history of 3 consecutive normal Pap tests.  HPV screening.** / Every 3 years from ages 30 years through ages 65 to 70 years with a history of 3 consecutive normal Pap tests.  Fecal occult blood test (FOBT) of stool. / Every year beginning at age 50 years and  continuing until age 75 years. You may not need to do this test if you get a colonoscopy every 10 years.  Flexible sigmoidoscopy or colonoscopy.** / Every 5 years for a flexible sigmoidoscopy or every 10 years for a colonoscopy beginning at age 50 years and continuing until age 75 years.  Hepatitis C blood test.** / For all people born from 1947 through 1965 and any individual with known risks for hepatitis C.  Skin self-exam. / Monthly.  Influenza vaccine. / Every year.  Tetanus, diphtheria, and acellular pertussis (Tdap/Td) vaccine.** / Consult your health care provider. Pregnant women should receive 1 dose of Tdap vaccine during each pregnancy. 1 dose of Td every 10 years.  Varicella vaccine.** / Consult your health care provider. Pregnant females who do not have evidence of immunity should receive the first dose after pregnancy.  Zoster vaccine.** / 1 dose for adults aged 60 years or older.  Measles, mumps, rubella (MMR) vaccine.** / You need at least 1 dose of MMR if you were born in 1957 or later. You may also need a second dose. For females of childbearing age, rubella immunity should be determined. If there is no evidence of immunity, females who are not pregnant should be vaccinated. If there is no evidence of immunity, females who are pregnant should delay immunization until after pregnancy.  Pneumococcal 13-valent conjugate (PCV13) vaccine.** / Consult your health care provider.  Pneumococcal polysaccharide (PPSV23) vaccine.** / 1 to 2 doses   if you smoke cigarettes or if you have certain conditions.  Meningococcal vaccine.** / Consult your health care provider.  Hepatitis A vaccine.** / Consult your health care provider.  Hepatitis B vaccine.** / Consult your health care provider.  Haemophilus influenzae type b (Hib) vaccine.** / Consult your health care provider. Ages 13 years and over  Blood pressure check.** / Every year.  Lipid and cholesterol check.** / Every 5 years  beginning at age 20 years.  Lung cancer screening. / Every year if you are aged 19-80 years and have a 30-pack-year history of smoking and currently smoke or have quit within the past 15 years. Yearly screening is stopped once you have quit smoking for at least 15 years or develop a health problem that would prevent you from having lung cancer treatment.  Clinical breast exam.** / Every year after age 15 years.  BRCA-related cancer risk assessment.** / For women who have family members with a BRCA-related cancer (breast, ovarian, tubal, or peritoneal cancers).  Mammogram.** / Every year beginning at age 44 years and continuing for as long as you are in good health. Consult with your health care provider.  Pap test.** / Every 3 years starting at age 63 years through age 22 or 63 years with 3 consecutive normal Pap tests. Testing can be stopped between 65 and 70 years with 3 consecutive normal Pap tests and no abnormal Pap or HPV tests in the past 10 years.  HPV screening.** / Every 3 years from ages 42 years through ages 100 or 24 years with a history of 3 consecutive normal Pap tests. Testing can be stopped between 65 and 70 years with 3 consecutive normal Pap tests and no abnormal Pap or HPV tests in the past 10 years.  Fecal occult blood test (FOBT) of stool. / Every year beginning at age 68 years and continuing until age 83 years. You may not need to do this test if you get a colonoscopy every 10 years.  Flexible sigmoidoscopy or colonoscopy.** / Every 5 years for a flexible sigmoidoscopy or every 10 years for a colonoscopy beginning at age 45 years and continuing until age 89 years.  Hepatitis C blood test.** / For all people born from [birth year] through 1967 and any individual with known risks for hepatitis C.  Osteoporosis screening.** / A one-time screening for women ages 72 years and over and women at risk for fractures or osteoporosis.  Skin self-exam. / Monthly.  Influenza vaccine. /  Every year.  Tetanus, diphtheria, and acellular pertussis (Tdap/Td) vaccine.** / 1 dose of Td every 10 years.  Varicella vaccine.** / Consult your health care provider.  Zoster vaccine.** / 1 dose for adults aged 74 years or older.  Pneumococcal 13-valent conjugate (PCV13) vaccine.** / Consult your health care provider.  Pneumococcal polysaccharide (PPSV23) vaccine.** / 1 dose for all adults aged 41 years and older.  Meningococcal vaccine.** / Consult your health care provider.  Hepatitis A vaccine.** / Consult your health care provider.  Hepatitis B vaccine.** / Consult your health care provider.  Haemophilus influenzae type b (Hib) vaccine.** / Consult your health care provider. ** Family history and personal history of risk and conditions may change your health care provider's recommendations.   This information is not intended to replace advice given to you by your health care provider. Make sure you discuss any questions you have with your health care provider.   Document Released: 11/07/2001 Document Revised: 10/02/2014 Document Reviewed: 02/06/2011 Elsevier Interactive Patient Education 2016  [employer].

## 2015-12-16 NOTE — Assessment & Plan Note (Signed)
Patient encouraged to maintain heart healthy diet, regular exercise, adequate sleep. Consider daily probiotics. Take medications as prescribed 

## 2015-12-16 NOTE — Progress Notes (Signed)
Pre visit review using our clinic review tool, if applicable. No additional management support is needed unless otherwise documented below in the visit note. 

## 2015-12-17 LAB — VITAMIN D 25 HYDROXY (VIT D DEFICIENCY, FRACTURES): VITD: 38.34 ng/mL (ref 30.00–100.00)

## 2015-12-17 LAB — TSH: TSH: 1.28 u[IU]/mL (ref 0.35–4.50)

## 2015-12-19 NOTE — Assessment & Plan Note (Signed)
Very mild, Encouraged heart healthy diet, increase exercise, avoid trans fats, consider a krill oil cap daily 

## 2015-12-19 NOTE — Assessment & Plan Note (Signed)
Encouraged daily supplements 

## 2015-12-19 NOTE — Progress Notes (Signed)
Patient ID: Whitney Trujillo, female   DOB: 05/27/1976, 40 y.o.   MRN: [id number]   Subjective:    Patient ID: Whitney ChaseMelissa A Trujillo, female    DOB: 05/27/1976, 40 y.o.   MRN: [id number]  Chief Complaint  Patient presents with  . Annual Exam    HPI Patient is in today for annual exam. She feels well today. Her biggest concern is her persistent difficulty with headaches. She has frequent tension headaches and also headahces with photophobia/phonophobia and nausea at times. Has not found a good treatment. She has recently been treated for a sinusitis which was partially helpful. She still notes some congestion. Denies CP/palp/SOB/HA/congestion/fevers/GI or GU c/o. Taking meds as prescribed  Past Medical History  Diagnosis Date  . Gallstones   . Pulmonary emboli (HCC)     b/l PE after hysterectomy, 5 weeks post op, [facility]  . Anxiety   . Endometriosis   . Allergy     seasonal allergies  . Hyperlipidemia, mixed 02/02/2015  . ADD (attention deficit disorder) 02/02/2015  . Preventative health care 12/16/2015  . Migraine 12/16/2015    Past Surgical History  Procedure Laterality Date  . Laparotomy    . Cesarean section  2007, 2009  . Cholecystectomy    . Wisdom tooth extraction    . Cystoscopy  10/2014  . Abdominal hysterectomy      ovaries removed, in 2 separate procedures  . Eye surgery      lasik b/l    Family History  Problem Relation Age of Onset  . Cancer Father     Carcinoid Syndrome, colon  . Colon cancer Father   . Ovarian cancer Maternal Aunt   . Diabetes Mother     2, diet controlled  . ADD / ADHD Son   . Heart disease Maternal Grandmother   . Stroke Paternal Grandmother   . Dementia Paternal Grandmother   . COPD Paternal Grandfather     black lun, Forensic scientistcoal miner, tobacco    Social History   Social History  . Marital Status: Single    Spouse Name: N/A  . Number of Children: N/A  . Years of Education: N/A   Occupational History  .  Not on file.   Social History Main Topics  . Smoking status: Never Smoker   . Smokeless tobacco: Never Used  . Alcohol Use: Yes     Comment: Socially  . Drug Use: No  . 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 Not on file   Social History Narrative    Outpatient Prescriptions Prior to Visit  Medication Sig Dispense Refill  . conjugated estrogens (PREMARIN) vaginal cream Apply small amount to labia 3 x weekly as directed 42.5 g 11  . Cholecalciferol (VITAMIN D) 2000 UNITS CAPS Take 1 capsule (2,000 Units total) by mouth daily. 30 capsule   . escitalopram (LEXAPRO) 10 MG tablet Take 1 tablet (10 mg total) by mouth daily. 30 tablet 11  . ibuprofen (ADVIL,MOTRIN) 200 MG tablet Take 800 mg by mouth every 6 (six) hours as needed for pain.    . NON FORMULARY Take 2 capsules by mouth. Micro Plex    . NON FORMULARY 4 capsules.    . NON FORMULARY Take 4 capsules by mouth.    . Omega-3 1000 MG CAPS Take 1 g by  mouth.    . Probiotic Product (PROBIOTIC + OMEGA-3 PO) Take 1 capsule by mouth.    . Probiotic Product (PROBIOTIC DAILY PO) Take by mouth.     No facility-administered medications prior to visit.    Allergies  Allergen Reactions  . Latex Itching    Review of Systems  Constitutional: Negative for fever and malaise/fatigue.  HENT: Negative for congestion.   Eyes: Positive for photophobia. Negative for blurred vision, double vision and pain.  Respiratory: Negative for shortness of breath.   Cardiovascular: Negative for chest pain, palpitations and leg swelling.  Gastrointestinal: Negative for nausea, abdominal pain and blood in stool.  Genitourinary: Negative for dysuria and frequency.  Musculoskeletal: Negative for falls.  Skin: Negative for rash.  Neurological: Positive for headaches. Negative for dizziness and loss of consciousness.  Endo/Heme/Allergies:  Negative for environmental allergies.  Psychiatric/Behavioral: Negative for depression. The patient is not nervous/anxious.        Objective:    Physical Exam  Constitutional: She is oriented to person, place, and time. She appears well-developed and well-nourished. No distress.  HENT:  Head: Normocephalic and atraumatic.  Eyes: Conjunctivae are normal.  Neck: Neck supple. No thyromegaly present.  Cardiovascular: Normal rate, regular rhythm and normal heart sounds.   No murmur heard. Pulmonary/Chest: Effort normal and breath sounds normal. No respiratory distress.  Abdominal: Soft. Bowel sounds are normal. She exhibits no distension and no mass. There is no tenderness.  Musculoskeletal: She exhibits no edema.  Lymphadenopathy:    She has no cervical adenopathy.  Neurological: She is alert and oriented to person, place, and time.  Skin: Skin is warm and dry.  Psychiatric: She has a normal mood and affect. Her behavior is normal.    BP 119/76 mmHg  Pulse 67  Temp(Src) 98 F (36.7 C) (Oral)  Ht  (1.499 m)  Wt 121 lb (54.885 kg)  BMI 24.43 kg/m2  SpO2 100% Wt Readings from Last 3 Encounters:  12/16/15 121 lb (54.885 kg)  02/02/15 116 lb (52.617 kg)  11/25/14 118 lb (53.524 kg)     Lab Results  Component Value Date   WBC 6.5 12/16/2015   HGB 14.3 12/16/2015   HCT 41.4 12/16/2015   PLT 251.0 12/16/2015   GLUCOSE 95 12/16/2015   CHOL 197 12/16/2015   TRIG 100.0 12/16/2015   HDL 68.30 12/16/2015   LDLCALC 109* 12/16/2015   ALT 13 12/16/2015   AST 16 12/16/2015   NA 140 12/16/2015   K 3.6 12/16/2015   CL 104 12/16/2015   CREATININE 0.67 12/16/2015   BUN 13 12/16/2015   CO2 29 12/16/2015   TSH 1.28 12/16/2015    Lab Results  Component Value Date   TSH 1.28 12/16/2015   Lab Results  Component Value Date   WBC 6.5 12/16/2015   HGB 14.3 12/16/2015   HCT 41.4 12/16/2015   MCV 85.0 12/16/2015   PLT 251.0 12/16/2015   Lab Results  Component Value Date     NA 140 12/16/2015   K 3.6 12/16/2015   CO2 29 12/16/2015   GLUCOSE 95 12/16/2015   BUN 13 12/16/2015   CREATININE 0.67 12/16/2015   BILITOT 1.1 12/16/2015   ALKPHOS 47 12/16/2015   AST 16 12/16/2015   ALT 13 12/16/2015   PROT 7.6 12/16/2015   ALBUMIN 4.5 12/16/2015   CALCIUM 9.7 12/16/2015   ANIONGAP 12 07/30/2014   GFR 102.87 12/16/2015   Lab Results  Component Value Date   CHOL 197 12/16/2015  Lab Results  Component Value Date   HDL 68.30 12/16/2015   Lab Results  Component Value Date   LDLCALC 109* 12/16/2015   Lab Results  Component Value Date   TRIG 100.0 12/16/2015   Lab Results  Component Value Date   CHOLHDL 3 12/16/2015   No results found for: HGBA1C     Assessment & Plan:   Problem List Items Addressed This Visit    Hyperlipidemia, mixed    Very mild, Encouraged heart healthy diet, increase exercise, avoid trans fats, consider a krill oil cap daily      Relevant Medications   rizatriptan (MAXALT) 10 MG tablet   Other Relevant Orders   Vitamin D (25 hydroxy) (Completed)   TSH (Completed)   CBC (Completed)   Comprehensive metabolic panel (Completed)   Lipid panel (Completed)   VITAMIN D 25 Hydroxy (Vit-D Deficiency, Fractures)   Migraine    Encouraged moist heat and gentle stretching as tolerated. May try NSAIDs and prescription meds as directed and report if symptoms worsen or seek immediate care. Did not tolerate Imitrex but will try Maxalt today      Relevant Medications   rizatriptan (MAXALT) 10 MG tablet   Other Relevant Orders   Vitamin D (25 hydroxy) (Completed)   TSH (Completed)   CBC (Completed)   Comprehensive metabolic panel (Completed)   Lipid panel (Completed)   VITAMIN D 25 Hydroxy (Vit-D Deficiency, Fractures)   Preventative health care    Patient encouraged to maintain heart healthy diet, regular exercise, adequate sleep. Consider daily probiotics. Take medications as prescribed      Relevant Medications    rizatriptan (MAXALT) 10 MG tablet   Other Relevant Orders   Vitamin D (25 hydroxy) (Completed)   TSH (Completed)   CBC (Completed)   Comprehensive metabolic panel (Completed)   Lipid panel (Completed)   VITAMIN D 25 Hydroxy (Vit-D Deficiency, Fractures)   Vitamin D deficiency - Primary    Encouraged daily supplements      Relevant Medications   rizatriptan (MAXALT) 10 MG tablet   Other Relevant Orders   Vitamin D (25 hydroxy) (Completed)   TSH (Completed)   CBC (Completed)   Comprehensive metabolic panel (Completed)   Lipid panel (Completed)   VITAMIN D 25 Hydroxy (Vit-D Deficiency, Fractures)      I have discontinued Ms. Sawhney's ibuprofen, Omega-3, Probiotic Product (PROBIOTIC + OMEGA-3 PO), escitalopram, NON FORMULARY, NON FORMULARY, NON FORMULARY, Probiotic Product (PROBIOTIC DAILY PO), and Vitamin D. I am also having her start on rizatriptan. Additionally, I am having her maintain her conjugated estrogens.  Meds ordered this encounter  Medications  . rizatriptan (MAXALT) 10 MG tablet    Sig: Take 1 tablet (10 mg total) by mouth as needed for migraine. May repeat in 2 hours if needed  Failed Imitrex with side effects    Dispense:  10 tablet    Refill:  0     Danise Edge, MD

## 2016-01-18 ENCOUNTER — Telehealth: Payer: Self-pay

## 2016-01-18 NOTE — Telephone Encounter (Signed)
Talked with patient and told her we are sending the claim to the insurance with some corrections for the labs and told her she would still have an outstanding balance for other concerns the provider addressed. That patient is ok with that

## 2016-01-18 NOTE — Telephone Encounter (Signed)
Patient called with concerns about and outstanding balance from her CPE. I reached out to coding to see if the visit was coded correctly... This is what they came back with...  Hi Elisha Cooksey,   There are a few lab charges that I can send to charge correction to change dx on to the Z code and possibly the insurance may cover, but I am not sure, it depends on her plan. However, the patient is still going to have balance, Dr. Abner GreenspanBlyth saw her for new problem(migraine) and prescribed RX in addition to seeing her for the CPE. The documentation supports this. I will CC you on what I send to charge correction.  Thanks,  Schering-PloughDawn Herrington, [facility], [facility]      I left a message for the patient to call me back for more information.

## 2016-01-25 ENCOUNTER — Telehealth: Payer: Self-pay | Admitting: Family Medicine

## 2016-01-25 ENCOUNTER — Ambulatory Visit (INDEPENDENT_AMBULATORY_CARE_PROVIDER_SITE_OTHER): Payer: Commercial Managed Care - HMO | Admitting: Family Medicine

## 2016-01-25 ENCOUNTER — Ambulatory Visit (HOSPITAL_BASED_OUTPATIENT_CLINIC_OR_DEPARTMENT_OTHER)
Admission: RE | Admit: 2016-01-25 | Discharge: 2016-01-25 | Disposition: A | Discharge status: Home / Self Care | Payer: Commercial Managed Care - HMO | Source: Ambulatory Visit | Attending: Family Medicine | Admitting: Family Medicine

## 2016-01-25 ENCOUNTER — Encounter: Payer: Self-pay | Admitting: Family Medicine

## 2016-01-25 VITALS — BP 92/64 | HR 69 | Temp 98.0°F | Ht 59.0 in | Wt 120.0 lb

## 2016-01-25 DIAGNOSIS — R109 Unspecified abdominal pain: Secondary | ICD-10-CM | POA: Diagnosis present

## 2016-01-25 DIAGNOSIS — R1031 Right lower quadrant pain: Secondary | ICD-10-CM

## 2016-01-25 DIAGNOSIS — K5900 Constipation, unspecified: Secondary | ICD-10-CM | POA: Diagnosis not present

## 2016-01-25 DIAGNOSIS — R10A0 Flank pain, unspecified side: Secondary | ICD-10-CM

## 2016-01-25 HISTORY — DX: Right lower quadrant pain: R10.31

## 2016-01-25 LAB — CBC WITH DIFFERENTIAL/PLATELET
BASOS PCT: 0.5 % (ref 0.0–3.0)
Basophils Absolute: 0 10*3/uL (ref 0.0–0.1)
EOS PCT: 1 % (ref 0.0–5.0)
Eosinophils Absolute: 0.1 10*3/uL (ref 0.0–0.7)
HCT: 41.4 % (ref 36.0–46.0)
Hemoglobin: 14 g/dL (ref 12.0–15.0)
LYMPHS ABS: 2.8 10*3/uL (ref 0.7–4.0)
Lymphocytes Relative: 43 % (ref 12.0–46.0)
MCHC: 33.8 g/dL (ref 30.0–36.0)
MCV: 85.5 fl (ref 78.0–100.0)
MONO ABS: 0.4 10*3/uL (ref 0.1–1.0)
Monocytes Relative: 6.3 % (ref 3.0–12.0)
NEUTROS ABS: 3.3 10*3/uL (ref 1.4–7.7)
NEUTROS PCT: 49.2 % (ref 43.0–77.0)
PLATELETS: 274 10*3/uL (ref 150.0–400.0)
RBC: 4.84 Mil/uL (ref 3.87–5.11)
RDW: 12.6 % (ref 11.5–15.5)
WBC: 6.6 10*3/uL (ref 4.0–10.5)

## 2016-01-25 LAB — COMPREHENSIVE METABOLIC PANEL
ALBUMIN: 4.4 g/dL (ref 3.5–5.2)
ALK PHOS: 49 U/L (ref 39–117)
ALT: 19 U/L (ref 0–35)
AST: 17 U/L (ref 0–37)
BILIRUBIN TOTAL: 1 mg/dL (ref 0.2–1.2)
BUN: 15 mg/dL (ref 6–23)
CO2: 31 mEq/L (ref 19–32)
Calcium: 9.9 mg/dL (ref 8.4–10.5)
Chloride: 102 mEq/L (ref 96–112)
Creatinine, Ser: 0.71 mg/dL (ref 0.40–1.20)
GFR: 96.16 mL/min (ref >60.00)
Glucose, Bld: 99 mg/dL (ref 70–99)
POTASSIUM: 3.4 meq/L — AB (ref 3.5–5.1)
SODIUM: 140 meq/L (ref 135–145)
Total Protein: 7.2 g/dL (ref 6.0–8.3)

## 2016-01-25 LAB — SEDIMENTATION RATE: SED RATE: 11 mm/h (ref 0–22)

## 2016-01-25 MED ORDER — IOPAMIDOL (ISOVUE-300) INJECTION 61%
100.0000 mL | Freq: Once | INTRAVENOUS | Status: AC | PRN
  Administered 2016-01-25: 100 mL via INTRAVENOUS

## 2016-01-25 NOTE — Progress Notes (Signed)
Subjective:    Patient ID: Whitney Trujillo, female    DOB: [birth year], 40 y.o.   MRN: [id number]  Chief Complaint  Patient presents with  . Flank Pain    HPI Patient is in today for Right Lower Quadrant pain. Patient reports having pain x 3 days with fatigue, loss of appetite, gas and diarrhea present today with some hypotension but has had some past episodes with the hypotension.   Denies CP/palp/SOB/HA Taking meds as prescribed. Denies nausea, vomiting, GU complaints Past Medical History  Diagnosis Date  . Gallstones   . Pulmonary emboli (HCC)     b/l PE after hysterectomy, 5 weeks post op, clotting problem ruled out at [facility]   . Endometriosis   . Allergy     seasonal allergies  . Hyperlipidemia, mixed 02/02/2015  . ADD (attention deficit disorder) 02/02/2015  . Preventative health care 12/16/2015  . Migraine 12/16/2015  . Acute right lower quadrant pain 01/25/2016    Past Surgical History  Procedure Laterality Date  . Laparotomy    . Cesarean section  2007, 2009  . Cholecystectomy    . Wisdom tooth extraction    . Cystoscopy  10/2014  . Abdominal hysterectomy      ovaries removed, in 2 separate procedures  . Eye surgery      lasik b/l    Family History  Problem Relation Age of Onset  . Cancer Father     Carcinoid Syndrome, colon  . Colon cancer Father   . Ovarian cancer Maternal Aunt   . Diabetes Mother     2, diet controlled  . ADD / ADHD Son   . Heart disease Maternal Grandmother   . Stroke Paternal Grandmother   . Dementia Paternal Grandmother   . COPD Paternal Grandfather     black lun, Ecologist, tobacco    Social History   Social History  . Marital Status: Single    Spouse Name: N/A  . Number of Children: N/A  . Years of Education: N/A   Occupational History  . Not on file.   Social History Main Topics  . Smoking status: Never Smoker   . Smokeless tobacco: Never Used  . Alcohol Use: Yes     Comment: Socially  . Drug Use: No   . 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 Not on file   Social History Narrative    Outpatient Prescriptions Prior to Visit  Medication Sig Dispense Refill  . conjugated estrogens (PREMARIN) vaginal cream Apply small amount to labia 3 x weekly as directed 42.5 g 11  . rizatriptan (MAXALT) 10 MG tablet Take 1 tablet (10 mg total) by mouth as needed for migraine. May repeat in 2 hours if needed  Failed Imitrex with side effects 10 tablet 0   No facility-administered medications prior to visit.    Allergies  Allergen Reactions  . Latex Itching    Review of Systems  Constitutional: Negative for fever and malaise/fatigue.  HENT: Negative for congestion.   Eyes: Negative for blurred vision.  Respiratory: Negative for shortness of breath.   Cardiovascular: Negative for chest pain, palpitations and leg swelling.  Gastrointestinal: Negative for nausea, abdominal pain and blood in stool.  Genitourinary: Negative for dysuria and frequency.  Musculoskeletal: Negative for falls.  Skin: Negative for rash.  Neurological: Negative  for dizziness, loss of consciousness and headaches.  Endo/Heme/Allergies: Negative for environmental allergies.  Psychiatric/Behavioral: Negative for depression. The patient is not nervous/anxious.        Objective:    Physical Exam  Constitutional: She is oriented to person, place, and time. She appears well-developed and well-nourished. No distress.  HENT:  Head: Normocephalic and atraumatic.  Eyes: Conjunctivae are normal.  Neck: Neck supple. No thyromegaly present.  Cardiovascular: Normal rate, regular rhythm and normal heart sounds.   No murmur heard. Pulmonary/Chest: Effort normal and breath sounds normal. No respiratory distress.  Abdominal: Soft. Bowel sounds are normal. She exhibits no distension and no mass. There is no  tenderness.  Musculoskeletal: She exhibits no edema.  Lymphadenopathy:    She has no cervical adenopathy.  Neurological: She is alert and oriented to person, place, and time.  Skin: Skin is warm and dry.  Psychiatric: She has a normal mood and affect. Her behavior is normal.    BP 92/64 mmHg  Pulse 69  Temp(Src) 98 F (36.7 C) (Oral)  Ht _0  (1.499 m)  Wt 120 lb (54.432 kg)  BMI 24.22 kg/m2  SpO2 95% Wt Readings from Last 3 Encounters:  01/25/16 120 lb (54.432 kg)  12/16/15 121 lb (54.885 kg)  02/02/15 116 lb (52.617 kg)     Lab Results  Component Value Date   WBC 6.5 12/16/2015   HGB 14.3 12/16/2015   HCT 41.4 12/16/2015   PLT 251.0 12/16/2015   GLUCOSE 95 12/16/2015   CHOL 197 12/16/2015   TRIG 100.0 12/16/2015   HDL 68.30 12/16/2015   LDLCALC 109* 12/16/2015   ALT 13 12/16/2015   AST 16 12/16/2015   NA 140 12/16/2015   K 3.6 12/16/2015   CL 104 12/16/2015   CREATININE 0.67 12/16/2015   BUN 13 12/16/2015   CO2 29 12/16/2015   TSH 1.28 12/16/2015    Lab Results  Component Value Date   TSH 1.28 12/16/2015   Lab Results  Component Value Date   WBC 6.5 12/16/2015   HGB 14.3 12/16/2015   HCT 41.4 12/16/2015   MCV 85.0 12/16/2015   PLT 251.0 12/16/2015   Lab Results  Component Value Date   NA 140 12/16/2015   K 3.6 12/16/2015   CO2 29 12/16/2015   GLUCOSE 95 12/16/2015   BUN 13 12/16/2015   CREATININE 0.67 12/16/2015   BILITOT 1.1 12/16/2015   ALKPHOS 47 12/16/2015   AST 16 12/16/2015   ALT 13 12/16/2015   PROT 7.6 12/16/2015   ALBUMIN 4.5 12/16/2015   CALCIUM 9.7 12/16/2015   ANIONGAP 12 07/30/2014   GFR 102.87 12/16/2015   Lab Results  Component Value Date   CHOL 197 12/16/2015   Lab Results  Component Value Date   HDL 68.30 12/16/2015   Lab Results  Component Value Date   LDLCALC 109* 12/16/2015   Lab Results  Component Value Date   TRIG 100.0 12/16/2015   Lab Results  Component Value Date   CHOLHDL 3 12/16/2015   No  results found for: HGBA1C     Assessment & Plan:   Problem List Items Addressed This Visit    Acute right lower quadrant pain    Sudden onset on evening of 4/29. Has been 3-9 of 10 since then. She is noting low grade fevers, malaise, anorexia as well. Denies any nausea or vomiting. Proceed with CT abdomen and labs if work up negative she is instructed to maintain clear fluids for next 24  hours and to seek further care if pain escalates.         Other Visit Diagnoses    Flank pain    -  Primary    Relevant Orders    CT Abdomen Pelvis W Contrast    CBC w/Diff    Comp Met (CMET)    Sedimentation rate       I am having Ms. Berninger maintain her conjugated estrogens and rizatriptan.  No orders of the defined types were placed in this encounter.     Penni Homans, MD

## 2016-01-25 NOTE — Telephone Encounter (Signed)
Caller name: Efraim Kaufmannmelissa Relationship to patient: self Can be reached: [phone]   Reason for call: Pt called back in stating that after everything is sinking in she has further questions about the 5mm cyst that was found because her father died of cancer and is very concerned. Pt is requesting call this evening if possible.

## 2016-01-25 NOTE — Progress Notes (Signed)
Pre visit review using our clinic review tool, if applicable. No additional management support is needed unless otherwise documented below in the visit note. 

## 2016-01-25 NOTE — Assessment & Plan Note (Signed)
Sudden onset on evening of 4/29. Has been 3-9 of 10 since then. She is noting low grade fevers, malaise, anorexia as well. Denies any nausea or vomiting. Proceed with CT abdomen and labs if work up negative she is instructed to maintain clear fluids for next 24 hours and to seek further care if pain escalates.

## 2016-01-25 NOTE — Patient Instructions (Signed)
Appendicitis Appendicitis is when the appendix is swollen (inflamed). The inflammation can lead to developing a hole (perforation) and a collection of pus (abscess). CAUSES  There is not always an obvious cause of appendicitis. Sometimes it is caused by an obstruction in the appendix. The obstruction can be caused by:  A small, hard, pea-sized ball of stool (fecalith).  Enlarged lymph glands in the appendix. SYMPTOMS   Pain around your belly button (navel) that moves toward your lower right belly (abdomen). The pain can become more severe and sharp as time passes.  Tenderness in the lower right abdomen. Pain gets worse if you cough or make a sudden movement.  Feeling sick to your stomach (nauseous).  Throwing up (vomiting).  Loss of appetite.  Fever.  Constipation.  Diarrhea.  Generally not feeling well. DIAGNOSIS   Physical exam.  Blood tests.  Urine test.  X-rays or a CT scan may confirm the diagnosis. TREATMENT  Once the diagnosis of appendicitis is made, the most common treatment is to remove the appendix as soon as possible. This procedure is called appendectomy. In an open appendectomy, a cut (incision) is made in the lower right abdomen and the appendix is removed. In a laparoscopic appendectomy, usually 3 small incisions are made. Long, thin instruments and a camera tube are used to remove the appendix. Most patients go home in 24 to 48 hours after appendectomy. In some situations, the appendix may have already perforated and an abscess may have formed. The abscess may have a "wall" around it as seen on a CT scan. In this case, a drain may be placed into the abscess to remove fluid, and you may be treated with antibiotic medicines that kill germs. The medicine is given through a tube in your vein (IV). Once the abscess has resolved, it may or may not be necessary to have an appendectomy. You may need to stay in the hospital longer than 48 hours.   This information is  not intended to replace advice given to you by your health care provider. Make sure you discuss any questions you have with your health care provider.   Document Released: 09/11/2005 Document Revised: 03/12/2012 Document Reviewed: 01/27/2015 Elsevier Interactive Patient Education 2016 Elsevier Inc.  

## 2016-01-26 NOTE — Telephone Encounter (Signed)
This cyst does not have any concerning characteristics and is very small. If radiology is concerned they will say so and recommend further imaging. They have not. Many people have these little cysts. We could order an MRI to further investigate if she is inclined to do so and/or could refer to gastroenterology for further evaluation

## 2016-01-27 NOTE — Telephone Encounter (Signed)
Called left message to call back 

## 2016-01-27 NOTE — Telephone Encounter (Signed)
Patient informed of PCP instructions regarding cyst.  Patient ok for now to not do anything at this time.

## 2016-02-08 ENCOUNTER — Encounter: Payer: 59 | Admitting: Family Medicine

## 2016-02-28 ENCOUNTER — Encounter: Payer: Self-pay | Admitting: Family Medicine

## 2016-03-03 ENCOUNTER — Ambulatory Visit (INDEPENDENT_AMBULATORY_CARE_PROVIDER_SITE_OTHER): Payer: Commercial Managed Care - HMO | Admitting: Family Medicine

## 2016-03-03 ENCOUNTER — Encounter: Payer: Self-pay | Admitting: Family Medicine

## 2016-03-03 VITALS — BP 102/62 | HR 68 | Temp 98.2°F | Ht 59.0 in | Wt 118.4 lb

## 2016-03-03 DIAGNOSIS — F329 Major depressive disorder, single episode, unspecified: Secondary | ICD-10-CM | POA: Diagnosis not present

## 2016-03-03 DIAGNOSIS — E782 Mixed hyperlipidemia: Secondary | ICD-10-CM | POA: Diagnosis not present

## 2016-03-03 DIAGNOSIS — E559 Vitamin D deficiency, unspecified: Secondary | ICD-10-CM

## 2016-03-03 DIAGNOSIS — F32A Depression, unspecified: Secondary | ICD-10-CM

## 2016-03-03 DIAGNOSIS — F418 Other specified anxiety disorders: Secondary | ICD-10-CM

## 2016-03-03 DIAGNOSIS — F909 Attention-deficit hyperactivity disorder, unspecified type: Secondary | ICD-10-CM

## 2016-03-03 DIAGNOSIS — F988 Other specified behavioral and emotional disorders with onset usually occurring in childhood and adolescence: Secondary | ICD-10-CM

## 2016-03-03 MED ORDER — METHYLPHENIDATE HCL ER (CD) 10 MG PO CPCR: 10.0000 mg | ORAL_CAPSULE | ORAL | Status: DC

## 2016-03-03 MED ORDER — VENLAFAXINE HCL ER 75 MG PO CP24: 75.0000 mg | ORAL_CAPSULE | Freq: Every day | ORAL | Status: DC

## 2016-03-03 MED ORDER — VENLAFAXINE HCL ER 37.5 MG PO CP24: 37.5000 mg | ORAL_CAPSULE | Freq: Every day | ORAL | Status: DC

## 2016-03-03 NOTE — Patient Instructions (Signed)
Generalized Anxiety Disorder Generalized anxiety disorder (GAD) is a mental disorder. It interferes with life functions, including relationships, work, and school. GAD is different from normal anxiety, which everyone experiences at some point in their lives in response to specific life events and activities. Normal anxiety actually helps us prepare for and get through these life events and activities. Normal anxiety goes away after the event or activity is over.  GAD causes anxiety that is not necessarily related to specific events or activities. It also causes excess anxiety in proportion to specific events or activities. The anxiety associated with GAD is also difficult to control. GAD can vary from mild to severe. People with severe GAD can have intense waves of anxiety with physical symptoms (panic attacks).  SYMPTOMS The anxiety and worry associated with GAD are difficult to control. This anxiety and worry are related to many life events and activities and also occur more days than not for 6 months or longer. People with GAD also have three or more of the following symptoms (one or more in children):  Restlessness.   Fatigue.  Difficulty concentrating.   Irritability.  Muscle tension.  Difficulty sleeping or unsatisfying sleep. DIAGNOSIS GAD is diagnosed through an assessment by your health care provider. Your health care provider will ask you questions aboutyour mood,physical symptoms, and events in your life. Your health care provider may ask you about your medical history and use of alcohol or drugs, including prescription medicines. Your health care provider may also do a physical exam and blood tests. Certain medical conditions and the use of certain substances can cause symptoms similar to those associated with GAD. Your health care provider may refer you to a mental health specialist for further evaluation. TREATMENT The following therapies are usually used to treat GAD:    Medication. Antidepressant medication usually is prescribed for long-term daily control. Antianxiety medicines may be added in severe cases, especially when panic attacks occur.   Talk therapy (psychotherapy). Certain types of talk therapy can be helpful in treating GAD by providing support, education, and guidance. A form of talk therapy called cognitive behavioral therapy can teach you healthy ways to think about and react to daily life events and activities.  Stress managementtechniques. These include yoga, meditation, and exercise and can be very helpful when they are practiced regularly. A mental health specialist can help determine which treatment is best for you. Some people see improvement with one therapy. However, other people require a combination of therapies.   This information is not intended to replace advice given to you by your health care provider. Make sure you discuss any questions you have with your health care provider.   Document Released: 01/06/2013 Document Revised: 10/02/2014 Document Reviewed: 01/06/2013 Elsevier Interactive Patient Education 2016 Elsevier Inc.  

## 2016-03-03 NOTE — Progress Notes (Signed)
Pre visit review using our clinic review tool, if applicable. No additional management support is needed unless otherwise documented below in the visit note. 

## 2016-03-15 ENCOUNTER — Encounter: Payer: Self-pay | Admitting: Family Medicine

## 2016-03-15 DIAGNOSIS — F418 Other specified anxiety disorders: Secondary | ICD-10-CM

## 2016-03-15 HISTORY — DX: Other specified anxiety disorders: F41.8

## 2016-03-15 NOTE — Assessment & Plan Note (Signed)
Did not tolerate Lexapro she feels it made her too foggy. She is still struggling with stress and racing, obsessive thoughts so will try Venlafaxine. She will report if any concerns.

## 2016-03-15 NOTE — Assessment & Plan Note (Signed)
Daily suppplementation

## 2016-03-15 NOTE — Assessment & Plan Note (Signed)
Encouraged heart healthy diet, increase exercise, avoid trans fats, consider a krill oil cap daily 

## 2016-03-15 NOTE — Assessment & Plan Note (Signed)
May continue Methylphenidate

## 2016-03-15 NOTE — Progress Notes (Signed)
Patient ID: Whitney Trujillo, female   DOB: 1976-02-28, 40 y.o.   MRN: [id number]   Subjective:    Patient ID: Whitney Trujillo, female    DOB: July 23, 1976, 40 y.o.   MRN: [id number]  Chief Complaint  Patient presents with  . Follow-up    Discuss ADHD medication  . Stress    HPI Patient is in today for follow up. Is doing well. No recent illness but has continue to struggle with high levels of stress and anxiety. Was unable to tolerate Lexapro but is willing to try a different medication. Denies CP/palp/SOB/HA/congestion/fevers/GI or GU c/o. Taking meds as prescribed  Past Medical History  Diagnosis Date  . Gallstones   . Pulmonary emboli (HCC)     b/l PE after hysterectomy, 5 weeks post op, clotting problem ruled out at [facility]  . Anxiety   . Endometriosis   . Allergy     seasonal allergies  . Hyperlipidemia, mixed 02/02/2015  . ADD (attention deficit disorder) 02/02/2015  . Preventative health care 12/16/2015  . Migraine 12/16/2015  . Acute right lower quadrant pain 01/25/2016  . Depression with anxiety 03/15/2016    Past Surgical History  Procedure Laterality Date  . Laparotomy    . Cesarean section  2007, 2009  . Cholecystectomy    . Wisdom tooth extraction    . Cystoscopy  10/2014  . Abdominal hysterectomy      ovaries removed, in 2 separate procedures  . Eye surgery      lasik b/l    Family History  Problem Relation Age of Onset  . Cancer Father     Carcinoid Syndrome, colon  . Colon cancer Father   . Ovarian cancer Maternal Aunt   . Diabetes Mother     2, diet controlled  . ADD / ADHD Son   . Heart disease Maternal Grandmother   . Stroke Paternal Grandmother   . Dementia Paternal Grandmother   . COPD Paternal Grandfather     black lun, Forensic scientist, tobacco    Social History   Social History  . Marital Status: Single    Spouse Name: N/A  . Number of Children: N/A  . Years of Education: N/A   Occupational History  . Not on file.   Social History  Main Topics  . Smoking status: Never Smoker   . Smokeless tobacco: Never Used  . Alcohol Use: Yes     Comment: Socially  . Drug Use: No  . 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 Not on file   Social History Narrative    Outpatient Prescriptions Prior to Visit  Medication Sig Dispense Refill  . conjugated estrogens (PREMARIN) vaginal cream Apply small amount to labia 3 x weekly as directed 42.5 g 11  . rizatriptan (MAXALT) 10 MG tablet Take 1 tablet (10 mg total) by mouth as needed for migraine. May repeat in 2 hours if needed  Failed Imitrex with side effects 10 tablet 0   No facility-administered medications prior to visit.    Allergies  Allergen Reactions  . Latex Itching    Review of Systems  Constitutional: Negative for fever and malaise/fatigue.  HENT: Negative for congestion.   Eyes: Positive for pain. Negative for blurred vision.  Respiratory: Negative for shortness of breath.   Cardiovascular: Negative for chest pain, palpitations and leg swelling.  Gastrointestinal: Negative for nausea, abdominal pain and blood in stool.  Genitourinary: Negative for dysuria and frequency.  Musculoskeletal: Negative for falls.  Skin: Negative for rash.  Neurological: Negative for dizziness, loss of consciousness and headaches.  Endo/Heme/Allergies: Negative for environmental allergies.  Psychiatric/Behavioral: Positive for depression. The patient is nervous/anxious.        Objective:    Physical Exam  Constitutional: She is oriented to person, place, and time. She appears well-developed and well-nourished. No distress.  HENT:  Head: Normocephalic and atraumatic.  Nose: Nose normal.  Eyes: Right eye exhibits no discharge. Left eye exhibits no discharge.  Neck: Normal range of motion. Neck supple.  Cardiovascular: Normal rate and regular rhythm.   No  murmur heard. Pulmonary/Chest: Effort normal and breath sounds normal.  Abdominal: Soft. Bowel sounds are normal. There is no tenderness.  Musculoskeletal: She exhibits no edema.  Neurological: She is alert and oriented to person, place, and time.  Skin: Skin is warm and dry.  Psychiatric: She has a normal mood and affect.  Nursing note and vitals reviewed.   BP 102/62 mmHg  Pulse 68  Temp(Src) 98.2 F (36.8 C) (Oral)  Ht 4\' 11"  (1.499 m)  Wt 118 lb 6 oz (53.695 kg)  BMI 23.90 kg/m2  SpO2 96% Wt Readings from Last 3 Encounters:  03/03/16 118 lb 6 oz (53.695 kg)  01/25/16 120 lb (54.432 kg)  12/16/15 121 lb (54.885 kg)     Lab Results  Component Value Date   WBC 6.6 01/25/2016   HGB 14.0 01/25/2016   HCT 41.4 01/25/2016   PLT 274.0 01/25/2016   GLUCOSE 99 01/25/2016   CHOL 197 12/16/2015   TRIG 100.0 12/16/2015   HDL 68.30 12/16/2015   LDLCALC 109* 12/16/2015   ALT 19 01/25/2016   AST 17 01/25/2016   NA 140 01/25/2016   K 3.4* 01/25/2016   CL 102 01/25/2016   CREATININE 0.71 01/25/2016   BUN 15 01/25/2016   CO2 31 01/25/2016   TSH 1.28 12/16/2015    Lab Results  Component Value Date   TSH 1.28 12/16/2015   Lab Results  Component Value Date   WBC 6.6 01/25/2016   HGB 14.0 01/25/2016   HCT 41.4 01/25/2016   MCV 85.5 01/25/2016   PLT 274.0 01/25/2016   Lab Results  Component Value Date   NA 140 01/25/2016   K 3.4* 01/25/2016   CO2 31 01/25/2016   GLUCOSE 99 01/25/2016   BUN 15 01/25/2016   CREATININE 0.71 01/25/2016   BILITOT 1.0 01/25/2016   ALKPHOS 49 01/25/2016   AST 17 01/25/2016   ALT 19 01/25/2016   PROT 7.2 01/25/2016   ALBUMIN 4.4 01/25/2016   CALCIUM 9.9 01/25/2016   ANIONGAP 12 07/30/2014   GFR 96.16 01/25/2016   Lab Results  Component Value Date   CHOL 197 12/16/2015   Lab Results  Component Value Date   HDL 68.30 12/16/2015   Lab Results  Component Value Date   LDLCALC 109* 12/16/2015   Lab Results  Component Value Date    TRIG 100.0 12/16/2015   Lab Results  Component Value Date   CHOLHDL 3 12/16/2015   No results found for: HGBA1C     Assessment & Plan:   Problem List Items Addressed This Visit    Vitamin D deficiency    Daily suppplementation      Hyperlipidemia, mixed    Encouraged heart healthy diet, increase exercise, avoid trans fats, consider a krill oil cap daily  Depression with anxiety - Primary    Did not tolerate Lexapro she feels it made her too foggy. She is still struggling with stress and racing, obsessive thoughts so will try Venlafaxine. She will report if any concerns.       Relevant Medications   venlafaxine XR (EFFEXOR XR) 37.5 MG 24 hr capsule   venlafaxine XR (EFFEXOR XR) 75 MG 24 hr capsule   ADD (attention deficit disorder)    May continue Methylphenidate         I am having Ms. Mcginness start on venlafaxine XR, venlafaxine XR, methylphenidate, and methylphenidate. I am also having her maintain her conjugated estrogens and rizatriptan.  Meds ordered this encounter  Medications  . venlafaxine XR (EFFEXOR XR) 37.5 MG 24 hr capsule    Sig: Take 1 capsule (37.5 mg total) by mouth daily with breakfast.    Dispense:  8 capsule    Refill:  0  . venlafaxine XR (EFFEXOR XR) 75 MG 24 hr capsule    Sig: Take 1 capsule (75 mg total) by mouth daily with breakfast.    Dispense:  30 capsule    Refill:  2  . methylphenidate (METADATE CD) 10 MG CR capsule    Sig: Take 1 capsule (10 mg total) by mouth every morning. June 2017    Dispense:  30 capsule    Refill:  0  . methylphenidate (METADATE CD) 10 MG CR capsule    Sig: Take 1 capsule (10 mg total) by mouth every morning. July 2017    Dispense:  30 capsule    Refill:  0     Danise EdgeBLYTH, Darius Fillingim, MD

## 2016-03-20 NOTE — Telephone Encounter (Signed)
Left message for patient to call back. The outstanding 65.70 is for her new issue brought up in the visit (migraines)  The cpe is covered in full and the labs associated with them.

## 2016-03-20 NOTE — Telephone Encounter (Signed)
Relationship to patient: self Can be reached: [phone]  Reason for call: Pt calling about the bill stating she owes for blood work from [facility] [facility] 12/16/15. She said it was supposed to be refiled as a well visit for the labs to be covered. Pt requesting f/u call to her.

## 2016-03-21 NOTE — Telephone Encounter (Signed)
Talked to patient again and the labs are the outstanding balance. Patient paid copayment for this and the labs remain. The coding for the labs have been change and the insurance has still applied it to her deductible. Patient will call the insurance to see how they need it filed to cover for preventive.

## 2016-03-24 ENCOUNTER — Ambulatory Visit: Payer: Self-pay | Admitting: Family Medicine

## 2016-04-03 ENCOUNTER — Ambulatory Visit: Payer: Self-pay | Admitting: Family Medicine

## 2016-04-04 ENCOUNTER — Ambulatory Visit: Payer: Self-pay | Admitting: Family Medicine

## 2016-04-18 ENCOUNTER — Ambulatory Visit: Payer: Commercial Managed Care - HMO | Admitting: Family Medicine

## 2016-04-25 ENCOUNTER — Encounter: Payer: Self-pay | Admitting: Family Medicine

## 2016-04-25 ENCOUNTER — Ambulatory Visit (INDEPENDENT_AMBULATORY_CARE_PROVIDER_SITE_OTHER): Payer: Commercial Managed Care - HMO | Admitting: Family Medicine

## 2016-04-25 DIAGNOSIS — E782 Mixed hyperlipidemia: Secondary | ICD-10-CM

## 2016-04-25 DIAGNOSIS — Z0000 Encounter for general adult medical examination without abnormal findings: Secondary | ICD-10-CM | POA: Diagnosis not present

## 2016-04-25 DIAGNOSIS — F329 Major depressive disorder, single episode, unspecified: Secondary | ICD-10-CM

## 2016-04-25 DIAGNOSIS — F909 Attention-deficit hyperactivity disorder, unspecified type: Secondary | ICD-10-CM

## 2016-04-25 DIAGNOSIS — G43809 Other migraine, not intractable, without status migrainosus: Secondary | ICD-10-CM

## 2016-04-25 DIAGNOSIS — F418 Other specified anxiety disorders: Secondary | ICD-10-CM

## 2016-04-25 DIAGNOSIS — F32A Depression, unspecified: Secondary | ICD-10-CM

## 2016-04-25 DIAGNOSIS — E559 Vitamin D deficiency, unspecified: Secondary | ICD-10-CM | POA: Diagnosis not present

## 2016-04-25 DIAGNOSIS — F988 Other specified behavioral and emotional disorders with onset usually occurring in childhood and adolescence: Secondary | ICD-10-CM

## 2016-04-25 MED ORDER — METHYLPHENIDATE HCL ER (CD) 10 MG PO CPCR: 10.0000 mg | ORAL_CAPSULE | ORAL | 0 refills | Status: DC

## 2016-04-25 MED ORDER — VENLAFAXINE HCL ER 75 MG PO CP24: 75.0000 mg | ORAL_CAPSULE | Freq: Every day | ORAL | 5 refills | Status: DC

## 2016-04-25 MED ORDER — RIZATRIPTAN BENZOATE 10 MG PO TABS: 10.0000 mg | ORAL_TABLET | ORAL | 5 refills | Status: DC | PRN

## 2016-04-25 NOTE — Patient Instructions (Signed)

## 2016-04-25 NOTE — Progress Notes (Signed)
Pre visit review using our clinic review tool, if applicable. No additional management support is needed unless otherwise documented below in the visit note. 

## 2016-05-07 NOTE — Assessment & Plan Note (Signed)
Encouraged heart healthy diet, increase exercise, avoid trans fats, consider a krill oil cap daily 

## 2016-05-07 NOTE — Assessment & Plan Note (Signed)
Doing well on Venlafaxine continue the same.  

## 2016-05-07 NOTE — Assessment & Plan Note (Signed)
Tolerating Methylphenidate and doing well. Allowed refills.

## 2016-05-07 NOTE — Assessment & Plan Note (Signed)
Encouraged daily supplements 

## 2016-05-07 NOTE — Progress Notes (Signed)
Patient ID: Whitney Trujillo, female   DOB: April 06, 1976, 40 y.o.   MRN: [id number]   Subjective:    Patient ID: Whitney Trujillo, female    DOB: 1976-05-10, 40 y.o.   MRN: [id number]  Chief Complaint  Patient presents with  . Follow-up    medication    HPI Patient is in today for follow up. She is doing well on current medications. Feels the Venlafaxine and Methylphenidate are helping her each day. No concerning side effects such as palpitatiosn, insomnia, chest pain etc. Denies SOB/HA/congestion/fevers/GI or GU c/o. Taking meds as prescribed  Past Medical History:  Diagnosis Date  . Acute right lower quadrant pain 01/25/2016  . ADD (attention deficit disorder) 02/02/2015  . Allergy    seasonal allergies  . Anxiety   . Depression with anxiety 03/15/2016  . Endometriosis   . Gallstones   . Hyperlipidemia, mixed 02/02/2015  . Migraine 12/16/2015  . Preventative health care 12/16/2015  . Pulmonary emboli (HCC)    b/l PE after hysterectomy, 5 weeks post op, clotting problem ruled out at [facility]    Past Surgical History:  Procedure Laterality Date  . ABDOMINAL HYSTERECTOMY     ovaries removed, in 2 separate procedures  . CESAREAN SECTION  2007, 2009  . CHOLECYSTECTOMY    . CYSTOSCOPY  10/2014  . EYE SURGERY     lasik b/l  . LAPAROTOMY    . WISDOM TOOTH EXTRACTION      Family History  Problem Relation Age of Onset  . Cancer Father     Carcinoid Syndrome, colon  . Colon cancer Father   . Ovarian cancer Maternal Aunt   . Diabetes Mother     2, diet controlled  . ADD / ADHD Son   . Heart disease Maternal Grandmother   . Stroke Paternal Grandmother   . Dementia Paternal Grandmother   . COPD Paternal Grandfather     black lun, Forensic scientist, tobacco    Social History   Social History  . Marital status: Single    Spouse name: N/A  . Number of children: N/A  . Years of education: N/A   Occupational History  . Not on file.   Social History Main Topics  . Smoking status:  Never Smoker  . Smokeless tobacco: Never Used  . Alcohol use Yes     Comment: Socially  . Drug use: No  . 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 Not on file   Social History Narrative  . No narrative on file    Outpatient Medications Prior to Visit  Medication Sig Dispense Refill  . conjugated estrogens (PREMARIN) vaginal cream Apply small amount to labia 3 x weekly as directed 42.5 g 11  . methylphenidate (METADATE CD) 10 MG CR capsule Take 1 capsule (10 mg total) by mouth every morning. June 2017 30 capsule 0  . methylphenidate (METADATE CD) 10 MG CR capsule Take 1 capsule (10 mg total) by mouth every morning. July 2017 30 capsule 0  . rizatriptan (MAXALT) 10 MG tablet Take 1 tablet (10 mg total) by mouth as needed for migraine. May repeat in 2 hours if needed  Failed Imitrex with side effects 10 tablet 0  . venlafaxine XR (EFFEXOR XR) 37.5 MG 24 hr capsule Take 1 capsule (37.5 mg total) by mouth daily with breakfast. 8  capsule 0  . venlafaxine XR (EFFEXOR XR) 75 MG 24 hr capsule Take 1 capsule (75 mg total) by mouth daily with breakfast. 30 capsule 2   No facility-administered medications prior to visit.     Allergies  Allergen Reactions  . Latex Itching    Review of Systems  Constitutional: Negative for fever and malaise/fatigue.  HENT: Negative for congestion.   Eyes: Negative for blurred vision.  Respiratory: Negative for shortness of breath.   Cardiovascular: Negative for chest pain, palpitations and leg swelling.  Gastrointestinal: Negative for abdominal pain, blood in stool and nausea.  Genitourinary: Negative for dysuria and frequency.  Musculoskeletal: Negative for falls.  Skin: Negative for rash.  Neurological: Negative for dizziness, loss of consciousness and headaches.  Endo/Heme/Allergies: Negative for environmental allergies.    Psychiatric/Behavioral: Negative for depression. The patient is not nervous/anxious.        Objective:    Physical Exam  Constitutional: She is oriented to person, place, and time. She appears well-developed and well-nourished. No distress.  HENT:  Head: Normocephalic and atraumatic.  Nose: Nose normal.  Eyes: Right eye exhibits no discharge. Left eye exhibits no discharge.  Neck: Normal range of motion. Neck supple.  Cardiovascular: Normal rate and regular rhythm.   No murmur heard. Pulmonary/Chest: Effort normal and breath sounds normal.  Abdominal: Soft. Bowel sounds are normal. There is no tenderness.  Musculoskeletal: She exhibits no edema.  Neurological: She is alert and oriented to person, place, and time.  Skin: Skin is warm and dry.  Psychiatric: She has a normal mood and affect.  Nursing note and vitals reviewed.   BP 122/72 (BP Location: Left Arm, Patient Position: Sitting, Cuff Size: Normal)   Pulse 62   Temp 98.2 F (36.8 C) (Oral)   Ht 4\' 11"  (1.499 m)   Wt 121 lb 8 oz (55.1 kg)   SpO2 95%   BMI 24.54 kg/m  Wt Readings from Last 3 Encounters:  04/25/16 121 lb 8 oz (55.1 kg)  03/03/16 118 lb 6 oz (53.7 kg)  01/25/16 120 lb (54.4 kg)     Lab Results  Component Value Date   WBC 6.6 01/25/2016   HGB 14.0 01/25/2016   HCT 41.4 01/25/2016   PLT 274.0 01/25/2016   GLUCOSE 99 01/25/2016   CHOL 197 12/16/2015   TRIG 100.0 12/16/2015   HDL 68.30 12/16/2015   LDLCALC 109 (H) 12/16/2015   ALT 19 01/25/2016   AST 17 01/25/2016   NA 140 01/25/2016   K 3.4 (L) 01/25/2016   CL 102 01/25/2016   CREATININE 0.71 01/25/2016   BUN 15 01/25/2016   CO2 31 01/25/2016   TSH 1.28 12/16/2015    Lab Results  Component Value Date   TSH 1.28 12/16/2015   Lab Results  Component Value Date   WBC 6.6 01/25/2016   HGB 14.0 01/25/2016   HCT 41.4 01/25/2016   MCV 85.5 01/25/2016   PLT 274.0 01/25/2016   Lab Results  Component Value Date   NA 140 01/25/2016   K  3.4 (L) 01/25/2016   CO2 31 01/25/2016   GLUCOSE 99 01/25/2016   BUN 15 01/25/2016   CREATININE 0.71 01/25/2016   BILITOT 1.0 01/25/2016   ALKPHOS 49 01/25/2016   AST 17 01/25/2016   ALT 19 01/25/2016   PROT 7.2 01/25/2016   ALBUMIN 4.4 01/25/2016   CALCIUM 9.9 01/25/2016   ANIONGAP 12 07/30/2014   GFR 96.16 01/25/2016   Lab Results  Component Value Date  CHOL 197 12/16/2015   Lab Results  Component Value Date   HDL 68.30 12/16/2015   Lab Results  Component Value Date   LDLCALC 109 (H) 12/16/2015   Lab Results  Component Value Date   TRIG 100.0 12/16/2015   Lab Results  Component Value Date   CHOLHDL 3 12/16/2015   No results found for: HGBA1C     Assessment & Plan:   Problem List Items Addressed This Visit    Hyperlipidemia, mixed    Encouraged heart healthy diet, increase exercise, avoid trans fats, consider a krill oil cap daily      Relevant Medications   rizatriptan (MAXALT) 10 MG tablet   Vitamin D deficiency    Encouraged daily supplements.      Relevant Medications   rizatriptan (MAXALT) 10 MG tablet   ADD (attention deficit disorder)    Tolerating Methylphenidate and doing well. Allowed refills.      Preventative health care   Relevant Medications   rizatriptan (MAXALT) 10 MG tablet   Migraine   Relevant Medications   rizatriptan (MAXALT) 10 MG tablet   venlafaxine XR (EFFEXOR XR) 75 MG 24 hr capsule   Depression with anxiety    Doing well on Venlafaxine continue the same.       Relevant Medications   venlafaxine XR (EFFEXOR XR) 75 MG 24 hr capsule    Other Visit Diagnoses    Depression       Relevant Medications   venlafaxine XR (EFFEXOR XR) 75 MG 24 hr capsule      I have changed Ms. Grygiel's methylphenidate and methylphenidate. I am also having her start on methylphenidate. Additionally, I am having her maintain her conjugated estrogens, rizatriptan, and venlafaxine XR.  Meds ordered this encounter  Medications  .  rizatriptan (MAXALT) 10 MG tablet    Sig: Take 1 tablet (10 mg total) by mouth as needed for migraine. May repeat in 2 hours if needed  Failed Imitrex with side effects    Dispense:  10 tablet    Refill:  5  . methylphenidate (METADATE CD) 10 MG CR capsule    Sig: Take 1 capsule (10 mg total) by mouth every morning. September  2017    Dispense:  30 capsule    Refill:  0  . methylphenidate (METADATE CD) 10 MG CR capsule    Sig: Take 1 capsule (10 mg total) by mouth every morning. October  2017    Dispense:  30 capsule    Refill:  0  . venlafaxine XR (EFFEXOR XR) 75 MG 24 hr capsule    Sig: Take 1 capsule (75 mg total) by mouth daily with breakfast.    Dispense:  30 capsule    Refill:  5  . methylphenidate (METADATE CD) 10 MG CR capsule    Sig: Take 1 capsule (10 mg total) by mouth every morning. November 2017 rx    Dispense:  30 capsule    Refill:  0     Danise Edge, MD

## 2016-06-29 ENCOUNTER — Other Ambulatory Visit: Payer: Self-pay | Admitting: Family Medicine

## 2016-06-29 ENCOUNTER — Other Ambulatory Visit (INDEPENDENT_AMBULATORY_CARE_PROVIDER_SITE_OTHER): Payer: Commercial Managed Care - HMO

## 2016-06-29 ENCOUNTER — Telehealth: Payer: Self-pay | Admitting: Family Medicine

## 2016-06-29 DIAGNOSIS — R300 Dysuria: Secondary | ICD-10-CM | POA: Diagnosis not present

## 2016-06-29 LAB — URINALYSIS, ROUTINE W REFLEX MICROSCOPIC
BILIRUBIN URINE: NEGATIVE
Hgb urine dipstick: NEGATIVE
Ketones, ur: NEGATIVE
Leukocytes, UA: NEGATIVE
NITRITE: POSITIVE — AB
PH: 7 (ref 5.0–8.0)
Specific Gravity, Urine: <=1.005 — AB (ref 1.000–1.030)
TOTAL PROTEIN, URINE-UPE24: NEGATIVE
Urine Glucose: NEGATIVE
Urobilinogen, UA: 1 (ref 0.0–1.0)

## 2016-06-29 NOTE — Telephone Encounter (Signed)
Called pt, she states she is still experiencing some low back pain and urinary pain. She is on 7 day course of macrobid, started yesterday, and wanted to know when she would start feeling better. Advised pt that it will likely take a few days before she may notice symptom improvement. Advised her that if any worsening of symptoms, or failure to improve after atbx complete, to follow-up w/ office. Please advise if any additional recommendations.

## 2016-06-29 NOTE — Telephone Encounter (Signed)
Patient informed . Put order in for ua/culture/made lab appt. For today 06/29/16

## 2016-06-29 NOTE — Telephone Encounter (Signed)
I cannot tell her anything really unless I have a urine sample with culture. It may not be right antibiotic, have her come in for sample.

## 2016-06-29 NOTE — Telephone Encounter (Addendum)
Relation to WU:JWJXpt:self Call back [phone] [facility] [place] - [address] [phone] (Phone) [phone] (Fax)     Reason for call:  Patient was seen at [facility] and patient was prescribe macrobid 100mg  and pyridum 200 mg for patient bladder infection . Offered patient a follow up patient denied and would like to discus medication prescribed. Please advise

## 2016-07-01 LAB — CULTURE, URINE COMPREHENSIVE: Organism ID, Bacteria: NO GROWTH

## 2016-07-02 ENCOUNTER — Encounter: Payer: Self-pay | Admitting: Family Medicine

## 2016-08-02 ENCOUNTER — Encounter: Payer: Self-pay | Admitting: Family Medicine

## 2016-08-02 ENCOUNTER — Other Ambulatory Visit: Payer: Self-pay | Admitting: Family Medicine

## 2016-08-02 DIAGNOSIS — L989 Disorder of the skin and subcutaneous tissue, unspecified: Secondary | ICD-10-CM

## 2016-10-03 ENCOUNTER — Other Ambulatory Visit: Payer: Self-pay | Admitting: Family Medicine

## 2016-10-03 DIAGNOSIS — Z1231 Encounter for screening mammogram for malignant neoplasm of breast: Secondary | ICD-10-CM

## 2016-10-17 ENCOUNTER — Ambulatory Visit (HOSPITAL_BASED_OUTPATIENT_CLINIC_OR_DEPARTMENT_OTHER)
Admission: RE | Admit: 2016-10-17 | Discharge: 2016-10-17 | Disposition: A | Discharge status: Home / Self Care | Payer: Commercial Managed Care - HMO | Source: Ambulatory Visit | Attending: Family Medicine | Admitting: Family Medicine

## 2016-10-17 DIAGNOSIS — Z1231 Encounter for screening mammogram for malignant neoplasm of breast: Secondary | ICD-10-CM | POA: Diagnosis present

## 2016-10-24 ENCOUNTER — Ambulatory Visit: Payer: Commercial Managed Care - HMO | Admitting: Family Medicine

## 2016-11-21 ENCOUNTER — Ambulatory Visit: Payer: Commercial Managed Care - HMO | Admitting: Family Medicine

## 2016-12-02 DIAGNOSIS — J018 Other acute sinusitis: Secondary | ICD-10-CM | POA: Diagnosis not present

## 2016-12-04 ENCOUNTER — Ambulatory Visit: Payer: Commercial Managed Care - HMO | Admitting: Family Medicine

## 2016-12-05 ENCOUNTER — Encounter: Payer: Self-pay | Admitting: Family Medicine

## 2016-12-06 ENCOUNTER — Other Ambulatory Visit: Payer: Self-pay | Admitting: Family Medicine

## 2016-12-06 DIAGNOSIS — F329 Major depressive disorder, single episode, unspecified: Secondary | ICD-10-CM

## 2016-12-06 DIAGNOSIS — F32A Depression, unspecified: Secondary | ICD-10-CM

## 2016-12-06 MED ORDER — VENLAFAXINE HCL ER 75 MG PO CP24: 75.0000 mg | ORAL_CAPSULE | Freq: Every day | ORAL | 1 refills | Status: DC

## 2016-12-19 ENCOUNTER — Encounter: Payer: Commercial Managed Care - HMO | Admitting: Family Medicine

## 2016-12-26 ENCOUNTER — Encounter: Payer: Commercial Managed Care - HMO | Admitting: Family Medicine

## 2017-01-08 ENCOUNTER — Ambulatory Visit (INDEPENDENT_AMBULATORY_CARE_PROVIDER_SITE_OTHER): Payer: Commercial Managed Care - HMO | Admitting: Family Medicine

## 2017-01-08 ENCOUNTER — Encounter: Payer: Self-pay | Admitting: Family Medicine

## 2017-01-08 VITALS — BP 106/60 | HR 71 | Temp 98.6°F | Ht 59.0 in | Wt 132.8 lb

## 2017-01-08 DIAGNOSIS — F988 Other specified behavioral and emotional disorders with onset usually occurring in childhood and adolescence: Secondary | ICD-10-CM

## 2017-01-08 DIAGNOSIS — Z0000 Encounter for general adult medical examination without abnormal findings: Secondary | ICD-10-CM | POA: Diagnosis not present

## 2017-01-08 DIAGNOSIS — E782 Mixed hyperlipidemia: Secondary | ICD-10-CM

## 2017-01-08 DIAGNOSIS — F418 Other specified anxiety disorders: Secondary | ICD-10-CM | POA: Diagnosis not present

## 2017-01-08 DIAGNOSIS — G43809 Other migraine, not intractable, without status migrainosus: Secondary | ICD-10-CM

## 2017-01-08 DIAGNOSIS — E559 Vitamin D deficiency, unspecified: Secondary | ICD-10-CM | POA: Diagnosis not present

## 2017-01-08 LAB — CBC
HCT: 43.2 % (ref 36.0–46.0)
HEMOGLOBIN: 14.4 g/dL (ref 12.0–15.0)
MCHC: 33.4 g/dL (ref 30.0–36.0)
MCV: 87.2 fl (ref 78.0–100.0)
PLATELETS: 279 10*3/uL (ref 150.0–400.0)
RBC: 4.96 Mil/uL (ref 3.87–5.11)
RDW: 13 % (ref 11.5–15.5)
WBC: 6.3 10*3/uL (ref 4.0–10.5)

## 2017-01-08 LAB — VITAMIN D 25 HYDROXY (VIT D DEFICIENCY, FRACTURES): VITD: 29.12 ng/mL — ABNORMAL LOW (ref 30.00–100.00)

## 2017-01-08 LAB — COMPREHENSIVE METABOLIC PANEL
ALBUMIN: 4.6 g/dL (ref 3.5–5.2)
ALT: 40 U/L — ABNORMAL HIGH (ref 0–35)
AST: 30 U/L (ref 0–37)
Alkaline Phosphatase: 45 U/L (ref 39–117)
BILIRUBIN TOTAL: 0.8 mg/dL (ref 0.2–1.2)
BUN: 14 mg/dL (ref 6–23)
CALCIUM: 9.7 mg/dL (ref 8.4–10.5)
CHLORIDE: 102 meq/L (ref 96–112)
CO2: 27 mEq/L (ref 19–32)
CREATININE: 0.83 mg/dL (ref 0.40–1.20)
GFR: 79.93 mL/min (ref >60.00)
Glucose, Bld: 100 mg/dL — ABNORMAL HIGH (ref 70–99)
Potassium: 4 mEq/L (ref 3.5–5.1)
Sodium: 137 mEq/L (ref 135–145)
Total Protein: 7.5 g/dL (ref 6.0–8.3)

## 2017-01-08 LAB — TSH: TSH: 1.99 u[IU]/mL (ref 0.35–4.50)

## 2017-01-08 LAB — LIPID PANEL
CHOL/HDL RATIO: 5
CHOLESTEROL: 301 mg/dL — AB (ref 0–200)
HDL: 65.8 mg/dL (ref >39.00)
LDL Cholesterol: 207 mg/dL — ABNORMAL HIGH (ref 0–99)
NonHDL: 235.23
TRIGLYCERIDES: 143 mg/dL (ref 0.0–149.0)
VLDL: 28.6 mg/dL (ref 0.0–40.0)

## 2017-01-08 NOTE — Assessment & Plan Note (Addendum)
Encouraged increased hydration, 64 ounces of clear fluids daily. Minimize alcohol and caffeine. Eat small frequent meals with lean proteins and complex carbs. Avoid high and low blood sugars. Get adequate sleep, 7-8 hours a night. Needs exercise daily preferably in the morning. Since stopping hormone creams and TAH she has not a headache.

## 2017-01-08 NOTE — Assessment & Plan Note (Signed)
Patient encouraged to maintain heart healthy diet, regular exercise, adequate sleep. Consider daily probiotics. Take medications as prescribed 

## 2017-01-08 NOTE — Progress Notes (Signed)
Patient ID: Whitney Trujillo, female   DOB: 1975/12/10, 41 y.o.   MRN: [id number]   Subjective:  I acted as a Neurosurgeon for Danise Edge, MD. [place], [place]   Patient ID: Whitney Trujillo, female    DOB: 1975-12-18, 41 y.o.   MRN: [id number]  Chief Complaint  Patient presents with  . Annual Exam  . Vitamin D Defiency  . Hyperlipidemia    Hyperlipidemia  This is a chronic problem. Pertinent negatives include no chest pain or shortness of breath.    Patient is in today for an annual examination. 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 Patient wants to discuss tapering off of the Effexor. Patient has no acute concerns noted at this time. She reports her son is doing well and is result she is doing well. She has not needed Metadate for months and would like to taper off her Effexor at this time. No recent febrile illness or hospitalization. She maintains adequate activity and a heart healthy diet. Denies CP/palp/SOB/HA/congestion/fevers/GI or GU c/o. Taking meds as prescribed  Patient Care Team: Bradd Canary, MD as PCP - General (Family Medicine) Earmon [place]. Cliffton Asters, MD (Obstetrics and Gynecology)   Past Medical History:  Diagnosis Date  . Acute right lower quadrant pain 01/25/2016  . ADD (attention deficit disorder) 02/02/2015  . Allergy    seasonal allergies  . Anxiety   . Depression with anxiety 03/15/2016  . Endometriosis   . Gallstones   . Hyperlipidemia, mixed 02/02/2015  . Migraine 12/16/2015  . Preventative health care 12/16/2015  . Pulmonary emboli (HCC)    b/l PE after hysterectomy, 5 weeks post op, clotting problem ruled out at [facility]    Past Surgical History:  Procedure Laterality Date  . ABDOMINAL HYSTERECTOMY     ovaries removed, in 2 separate procedures  . CESAREAN SECTION  2007, 2009  . CHOLECYSTECTOMY    . CYSTOSCOPY  10/2014  . EYE SURGERY     lasik b/l  . LAPAROTOMY    . WISDOM TOOTH EXTRACTION      Family History    Problem Relation Age of Onset  . Cancer Father     Carcinoid Syndrome, colon  . Colon cancer Father   . Diabetes Mother     2, diet controlled  . ADD / ADHD Son   . Heart disease Maternal Grandmother   . Stroke Paternal Grandmother   . Dementia Paternal Grandmother   . COPD Paternal Grandfather     black lun, Forensic scientist, tobacco  . Ovarian cancer Maternal Aunt     Social History   Social History  . Marital status: Single    Spouse name: N/A  . Number of children: N/A  . Years of education: N/A   Occupational History  . Not on file.   Social History Main Topics  . Smoking status: Never Smoker  . Smokeless tobacco: Never Used  . Alcohol use Yes     Comment: Socially  . Drug use: No  . 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 Not on file   Social History Narrative  . No narrative on file    Outpatient Medications Prior to Visit  Medication Sig Dispense Refill  . conjugated estrogens (PREMARIN) vaginal cream Apply small amount to labia 3 x weekly as directed (Patient  not taking: Reported on 01/08/2017) 42.5 g 11  . methylphenidate (METADATE CD) 10 MG CR capsule Take 1 capsule (10 mg total) by mouth every morning. September  2017 (Patient not taking: Reported on 01/08/2017) 30 capsule 0  . methylphenidate (METADATE CD) 10 MG CR capsule Take 1 capsule (10 mg total) by mouth every morning. October  2017 (Patient not taking: Reported on 01/08/2017) 30 capsule 0  . methylphenidate (METADATE CD) 10 MG CR capsule Take 1 capsule (10 mg total) by mouth every morning. November 2017 rx (Patient not taking: Reported on 01/08/2017) 30 capsule 0  . rizatriptan (MAXALT) 10 MG tablet Take 1 tablet (10 mg total) by mouth as needed for migraine. May repeat in 2 hours if needed  Failed Imitrex with side effects (Patient not taking: Reported on 01/08/2017) 10 tablet 5   . venlafaxine XR (EFFEXOR XR) 75 MG 24 hr capsule Take 1 capsule (75 mg total) by mouth daily with breakfast. (Patient not taking: Reported on 01/08/2017) 30 capsule 1   No facility-administered medications prior to visit.     Allergies  Allergen Reactions  . Latex Itching    Review of Systems  Constitutional: Negative for fever and malaise/fatigue.  HENT: Negative for congestion.   Eyes: Negative for blurred vision.  Respiratory: Negative for cough and shortness of breath.   Cardiovascular: Negative for chest pain, palpitations and leg swelling.  Gastrointestinal: Negative for heartburn, nausea and vomiting.  Genitourinary: Negative for dysuria.  Musculoskeletal: Negative for back pain.  Skin: Negative for rash.  Neurological: Negative for loss of consciousness and headaches.  Psychiatric/Behavioral: Negative for depression and suicidal ideas.       Objective:    Physical Exam  Constitutional: She is oriented to person, place, and time. She appears well-developed and well-nourished. No distress.  HENT:  Head: Normocephalic and atraumatic.  Eyes: Conjunctivae are normal.  Neck: Normal range of motion. No thyromegaly present.  Cardiovascular: Normal rate and regular rhythm.   Pulmonary/Chest: Effort normal and breath sounds normal. She has no wheezes.  Abdominal: Soft. Bowel sounds are normal. There is no tenderness.  Musculoskeletal: She exhibits no edema or deformity.  Neurological: She is alert and oriented to person, place, and time.  Skin: Skin is warm and dry. She is not diaphoretic.  Psychiatric: She has a normal mood and affect.    BP 106/60 (BP Location: Right Arm, Patient Position: Sitting, Cuff Size: Normal)   Pulse 71   Temp 98.6 F (37 C) (Oral)   Ht  (1.499 m)   Wt 132 lb 12.8 oz (60.2 kg)   SpO2 99% Comment: RA  BMI 26.82 kg/m  Wt Readings from Last 3 Encounters:  01/08/17 132 lb 12.8 oz (60.2 kg)  04/25/16 121 lb 8 oz (55.1 kg)  03/03/16 118  lb 6 oz (53.7 kg)   BP Readings from Last 3 Encounters:  01/08/17 106/60  04/25/16 122/72  03/03/16 102/62     Immunization History  Administered Date(s) Administered  . Influenza-Unspecified 06/30/2014    Health Maintenance  Topic Date Due  . HIV Screening  01/08/2018 (Originally 07/07/1989)  . INFLUENZA VACCINE  04/25/2017  . TETANUS/TDAP  09/24/2018    Lab Results  Component Value Date   WBC 6.6 01/25/2016   HGB 14.0 01/25/2016   HCT 41.4 01/25/2016   PLT 274.0 01/25/2016   GLUCOSE 99 01/25/2016   CHOL 197 12/16/2015   TRIG 100.0 12/16/2015   HDL 68.30 12/16/2015   LDLCALC 109 (H) 12/16/2015  ALT 19 01/25/2016   AST 17 01/25/2016   NA 140 01/25/2016   K 3.4 (L) 01/25/2016   CL 102 01/25/2016   CREATININE 0.71 01/25/2016   BUN 15 01/25/2016   CO2 31 01/25/2016   TSH 1.28 12/16/2015    Lab Results  Component Value Date   TSH 1.28 12/16/2015   Lab Results  Component Value Date   WBC 6.6 01/25/2016   HGB 14.0 01/25/2016   HCT 41.4 01/25/2016   MCV 85.5 01/25/2016   PLT 274.0 01/25/2016   Lab Results  Component Value Date   NA 140 01/25/2016   K 3.4 (L) 01/25/2016   CO2 31 01/25/2016   GLUCOSE 99 01/25/2016   BUN 15 01/25/2016   CREATININE 0.71 01/25/2016   BILITOT 1.0 01/25/2016   ALKPHOS 49 01/25/2016   AST 17 01/25/2016   ALT 19 01/25/2016   PROT 7.2 01/25/2016   ALBUMIN 4.4 01/25/2016   CALCIUM 9.9 01/25/2016   ANIONGAP 12 07/30/2014   GFR 96.16 01/25/2016   Lab Results  Component Value Date   CHOL 197 12/16/2015   Lab Results  Component Value Date   HDL 68.30 12/16/2015   Lab Results  Component Value Date   LDLCALC 109 (H) 12/16/2015   Lab Results  Component Value Date   TRIG 100.0 12/16/2015   Lab Results  Component Value Date   CHOLHDL 3 12/16/2015   No results found for: HGBA1C       Assessment & Plan:   Problem List Items Addressed This Visit    Hyperlipidemia, mixed    Encouraged heart healthy diet, increase  exercise, avoid trans fats, consider a krill oil cap daily      Vitamin D deficiency    Take a daily vitamin D suppplements, check level today      Relevant Orders   VITAMIN D 25 Hydroxy (Vit-D Deficiency, Fractures)   ADD (attention deficit disorder)    Is doing well without the Metadate. No new rx today      Preventative health care - Primary    Patient encouraged to maintain heart healthy diet, regular exercise, adequate sleep. Consider daily probiotics. Take medications as prescribed      Relevant Orders   CBC   Comprehensive metabolic panel   Lipid panel   TSH   VITAMIN D 25 Hydroxy (Vit-D Deficiency, Fractures)   Migraine    Encouraged increased hydration, 64 ounces of clear fluids daily. Minimize alcohol and caffeine. Eat small frequent meals with lean proteins and complex carbs. Avoid high and low blood sugars. Get adequate sleep, 7-8 hours a night. Needs exercise daily preferably in the morning. Since stopping hormone creams and TAH she has not a headache.       Depression with anxiety    Is doing well. And would like to titrate off the Effexor XR 75 mg qod x 2-4 weeks, then 37.5 mg x 4 days if she feels more than that is needed can call if for a month supply ov Venlafaxine XR 37.5 mg caps. Call with any concerns       Other Visit Diagnoses    Mixed hyperlipidemia       Relevant Orders   Lipid panel      I have discontinued Ms. Witters's conjugated estrogens, rizatriptan, methylphenidate, methylphenidate, methylphenidate, and venlafaxine XR.  No orders of the defined types were placed in this encounter.   CMA served as Neurosurgeon during this visit. History, Physical and Plan performed by medical  provider. Documentation and orders reviewed and attested to.  Danise Edge, MD

## 2017-01-08 NOTE — Assessment & Plan Note (Signed)
Take a daily vitamin D suppplements, check level today

## 2017-01-08 NOTE — Assessment & Plan Note (Signed)
Encouraged heart healthy diet, increase exercise, avoid trans fats, consider a krill oil cap daily 

## 2017-01-08 NOTE — Progress Notes (Signed)
Pre visit review using our clinic review tool, if applicable. No additional management support is needed unless otherwise documented below in the visit note. 

## 2017-01-08 NOTE — Assessment & Plan Note (Addendum)
Is doing well. And would like to titrate off the Effexor XR 75 mg qod x 2-4 weeks, then 37.5 mg x 4 days if she feels more than that is needed can call if for a month supply ov Venlafaxine XR 37.5 mg caps. Call with any concerns

## 2017-01-08 NOTE — Patient Instructions (Addendum)
Take the Effexor 67m every other day for 2-4 weeks; then 37.59m Spoke with you today in regards to [employer] to you bring a notarized copy of your Living Will and Healthcare Power of Attorney into Dr. BlFrederik Pearffice to be scanned into your Chart. Preventive Care 18-39 Years, Female Preventive care refers to lifestyle choices and visits with your health care provider that can promote health and wellness. What does preventive care include?  A yearly physical exam. This is also called an annual well check.  Dental exams once or twice a year.  Routine eye exams. Ask your health care provider how often you should have your eyes checked.  Personal lifestyle choices, including:  Daily care of your teeth and gums.  Regular physical activity.  Eating a healthy diet.  Avoiding tobacco and drug use.  Limiting alcohol use.  Practicing safe sex.  Taking vitamin and mineral supplements as recommended by your health care provider. What happens during an annual well check? The services and screenings done by your health care provider during your annual well check will depend on your age, overall health, lifestyle risk factors, and family history of disease. Counseling  Your health care provider may ask you questions about your:  Alcohol use.  Tobacco use.  Drug use.  Emotional well-being.  Home and relationship well-being.  Sexual activity.  Eating habits.  Work and work enStatistician Method of birth control.  Menstrual cycle.  Pregnancy history. Screening  You may have the following tests or measurements:  Height, weight, and BMI.  Diabetes screening. This is done by checking your blood sugar (glucose) after you have not eaten for a while (fasting).  Blood pressure.  Lipid and cholesterol levels. These may be checked every 5 years starting at age 937 Skin check.  Hepatitis C blood test.  Hepatitis B blood test.  Sexually transmitted  disease (STD) testing.  BRCA-related cancer screening. This may be done if you have a family history of breast, ovarian, tubal, or peritoneal cancers.  Pelvic exam and Pap test. This may be done every 3 years starting at age 6958Starting at age 4239this may be done every 5 years if you have a Pap test in combination with an HPV test. Discuss your test results, treatment options, and if necessary, the need for more tests with your health care provider. Vaccines  Your health care provider may recommend certain vaccines, such as:  Influenza vaccine. This is recommended every year.  Tetanus, diphtheria, and acellular pertussis (Tdap, Td) vaccine. You may need a Td booster every 10 years.  Varicella vaccine. You may need this if you have not been vaccinated.  HPV vaccine. If you are 2614r younger, you may need three doses over 6 months.  Measles, mumps, and rubella (MMR) vaccine. You may need at least one dose of MMR. You may also need a second dose.  Pneumococcal 13-valent conjugate (PCV13) vaccine. You may need this if you have certain conditions and were not previously vaccinated.  Pneumococcal polysaccharide (PPSV23) vaccine. You may need one or two doses if you smoke cigarettes or if you have certain conditions.  Meningococcal vaccine. One dose is recommended if you are age 68-21 years and a first-year college student living in a residence hall, or if you have one of several medical conditions. You may also need additional booster doses.  Hepatitis A vaccine. You may need this if you have certain conditions or if you travel or work in places where  you may be exposed to hepatitis A.  Hepatitis B vaccine. You may need this if you have certain conditions or if you travel or work in places where you may be exposed to hepatitis B.  Haemophilus influenzae type b (Hib) vaccine. You may need this if you have certain risk factors. Talk to your health care provider about which screenings and  vaccines you need and how often you need them. This information is not intended to replace advice given to you by your health care provider. Make sure you discuss any questions you have with your health care provider. Document Released: 11/07/2001 Document Revised: 05/31/2016 Document Reviewed: 07/13/2015 Elsevier Interactive Patient Education  2017 [employer].

## 2017-01-08 NOTE — Assessment & Plan Note (Signed)
Is doing well without the Metadate. No new rx today

## 2017-01-09 ENCOUNTER — Other Ambulatory Visit: Payer: Self-pay | Admitting: Family Medicine

## 2017-01-09 DIAGNOSIS — E785 Hyperlipidemia, unspecified: Secondary | ICD-10-CM

## 2017-01-09 DIAGNOSIS — Z0000 Encounter for general adult medical examination without abnormal findings: Secondary | ICD-10-CM

## 2017-02-01 DIAGNOSIS — N951 Menopausal and female climacteric states: Secondary | ICD-10-CM | POA: Diagnosis not present

## 2017-02-08 DIAGNOSIS — N951 Menopausal and female climacteric states: Secondary | ICD-10-CM | POA: Diagnosis not present

## 2017-03-09 DIAGNOSIS — R232 Flushing: Secondary | ICD-10-CM | POA: Diagnosis not present

## 2017-03-19 ENCOUNTER — Telehealth: Payer: Self-pay | Admitting: *Deleted

## 2017-03-19 DIAGNOSIS — G479 Sleep disorder, unspecified: Secondary | ICD-10-CM | POA: Diagnosis not present

## 2017-03-19 DIAGNOSIS — N951 Menopausal and female climacteric states: Secondary | ICD-10-CM | POA: Diagnosis not present

## 2017-03-19 NOTE — Telephone Encounter (Signed)
[facility] for Medical records [facility] [place] [facility], forwarded to [place] for email/scan/SLS 06/25

## 2017-04-10 ENCOUNTER — Encounter: Payer: Self-pay | Admitting: Family Medicine

## 2017-04-10 ENCOUNTER — Other Ambulatory Visit (INDEPENDENT_AMBULATORY_CARE_PROVIDER_SITE_OTHER): Payer: 59

## 2017-04-10 DIAGNOSIS — E785 Hyperlipidemia, unspecified: Secondary | ICD-10-CM

## 2017-04-10 DIAGNOSIS — Z0000 Encounter for general adult medical examination without abnormal findings: Secondary | ICD-10-CM | POA: Diagnosis not present

## 2017-04-10 LAB — LIPID PANEL
CHOL/HDL RATIO: 3
CHOLESTEROL: 168 mg/dL (ref 0–200)
HDL: 49.1 mg/dL (ref >39.00)
LDL CALC: 107 mg/dL — AB (ref 0–99)
NonHDL: 118.95
TRIGLYCERIDES: 62 mg/dL (ref 0.0–149.0)
VLDL: 12.4 mg/dL (ref 0.0–40.0)

## 2017-04-10 LAB — COMPREHENSIVE METABOLIC PANEL
ALBUMIN: 4.2 g/dL (ref 3.5–5.2)
ALT: 14 U/L (ref 0–35)
AST: 15 U/L (ref 0–37)
Alkaline Phosphatase: 49 U/L (ref 39–117)
BILIRUBIN TOTAL: 1 mg/dL (ref 0.2–1.2)
BUN: 14 mg/dL (ref 6–23)
CALCIUM: 9.4 mg/dL (ref 8.4–10.5)
CO2: 28 meq/L (ref 19–32)
CREATININE: 0.8 mg/dL (ref 0.40–1.20)
Chloride: 105 mEq/L (ref 96–112)
GFR: 83.3 mL/min (ref >60.00)
Glucose, Bld: 100 mg/dL — ABNORMAL HIGH (ref 70–99)
Potassium: 3.9 mEq/L (ref 3.5–5.1)
SODIUM: 139 meq/L (ref 135–145)
Total Protein: 6.6 g/dL (ref 6.0–8.3)

## 2017-04-10 LAB — CBC
HCT: 42.8 % (ref 36.0–46.0)
Hemoglobin: 14.7 g/dL (ref 12.0–15.0)
MCHC: 34.4 g/dL (ref 30.0–36.0)
MCV: 86.6 fl (ref 78.0–100.0)
Platelets: 263 10*3/uL (ref 150.0–400.0)
RBC: 4.94 Mil/uL (ref 3.87–5.11)
RDW: 13 % (ref 11.5–15.5)
WBC: 6 10*3/uL (ref 4.0–10.5)

## 2017-04-10 LAB — TSH: TSH: 1.28 u[IU]/mL (ref 0.35–4.50)

## 2017-04-12 ENCOUNTER — Ambulatory Visit: Payer: Commercial Managed Care - HMO | Admitting: Family Medicine

## 2017-04-23 ENCOUNTER — Ambulatory Visit: Payer: Commercial Managed Care - HMO | Admitting: Family Medicine

## 2017-05-08 DIAGNOSIS — G479 Sleep disorder, unspecified: Secondary | ICD-10-CM | POA: Diagnosis not present

## 2017-05-08 DIAGNOSIS — R232 Flushing: Secondary | ICD-10-CM | POA: Diagnosis not present

## 2017-05-08 DIAGNOSIS — N951 Menopausal and female climacteric states: Secondary | ICD-10-CM | POA: Diagnosis not present

## 2017-05-15 DIAGNOSIS — G479 Sleep disorder, unspecified: Secondary | ICD-10-CM | POA: Diagnosis not present

## 2017-05-15 DIAGNOSIS — R5383 Other fatigue: Secondary | ICD-10-CM | POA: Diagnosis not present

## 2017-05-29 ENCOUNTER — Encounter: Payer: Self-pay | Admitting: Family Medicine

## 2017-05-29 ENCOUNTER — Ambulatory Visit (INDEPENDENT_AMBULATORY_CARE_PROVIDER_SITE_OTHER): Payer: 59 | Admitting: Family Medicine

## 2017-05-29 VITALS — BP 92/62 | HR 68 | Temp 98.3°F | Resp 18 | Ht 59.0 in | Wt 121.0 lb

## 2017-05-29 DIAGNOSIS — Z0000 Encounter for general adult medical examination without abnormal findings: Secondary | ICD-10-CM

## 2017-05-29 DIAGNOSIS — F418 Other specified anxiety disorders: Secondary | ICD-10-CM

## 2017-05-29 DIAGNOSIS — Z23 Encounter for immunization: Secondary | ICD-10-CM | POA: Diagnosis not present

## 2017-05-29 DIAGNOSIS — F988 Other specified behavioral and emotional disorders with onset usually occurring in childhood and adolescence: Secondary | ICD-10-CM | POA: Diagnosis not present

## 2017-05-29 MED ORDER — LISDEXAMFETAMINE DIMESYLATE 10 MG PO CAPS: 10.0000 mg | ORAL_CAPSULE | Freq: Every day | ORAL | 0 refills | Status: DC

## 2017-05-29 NOTE — Progress Notes (Signed)
Subjective:  I acted as a Neurosurgeon for Dr. Abner Greenspan. [place], [place]  Patient ID: Whitney Trujillo, female    DOB: 1976-03-03, 41 y.o.   MRN: [id number]  No chief complaint on file.   HPI  Patient is in today for a follow up. Patient wants to discuss some anxiety. She states she can not focus, and gets little to no sleep at night. She is struggling with stress at home with her husband and the kids having trouble communicating without her and she has a good deal of stress at work as well. She notes some anhedonia but no severe depression or suicidal ideation. She notes with the poor sleep and increased stress her ADD has exacerbated and she is having trouble concentrating and completing tasks. Denies CP/palp/SOB/HA/congestion/fevers/GI or GU c/o. Taking meds as prescribed  Patient Care Team: Bradd Canary, MD as PCP - General (Family Medicine) Ola Spurr., MD (Obstetrics and Gynecology)   Past Medical History:  Diagnosis Date  . Acute right lower quadrant pain 01/25/2016  . ADD (attention deficit disorder) 02/02/2015  . Allergy    seasonal allergies  . Anxiety   . Depression with anxiety 03/15/2016  . Endometriosis   . Gallstones   . Hyperlipidemia, mixed 02/02/2015  . Migraine 12/16/2015  . Preventative health care 12/16/2015  . Pulmonary emboli (HCC)    b/l PE after hysterectomy, 5 weeks post op, clotting problem ruled out at [facility]    Past Surgical History:  Procedure Laterality Date  . ABDOMINAL HYSTERECTOMY     ovaries removed, in 2 separate procedures  . CESAREAN SECTION  2007, 2009  . CHOLECYSTECTOMY    . CYSTOSCOPY  10/2014  . EYE SURGERY     lasik b/l  . LAPAROTOMY    . WISDOM TOOTH EXTRACTION      Family History  Problem Relation Age of Onset  . Cancer Father        Carcinoid Syndrome, colon  . Colon cancer Father   . Diabetes Mother        2, diet controlled  . ADD / ADHD Son   . Heart disease Maternal Grandmother   . Stroke Paternal Grandmother   .  Dementia Paternal Grandmother   . COPD Paternal Grandfather        black lun, Forensic scientist, tobacco  . Ovarian cancer Maternal Aunt     Social History   Social History  . Marital status: Single    Spouse name: N/A  . Number of children: N/A  . Years of education: N/A   Occupational History  . Not on file.   Social History Main Topics  . Smoking status: Never Smoker  . Smokeless tobacco: Never Used  . Alcohol use Yes     Comment: Socially  . Drug use: No  . 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 Not on file   Social History Narrative  . No narrative on file    No outpatient prescriptions prior to visit.   No facility-administered medications prior to visit.     Allergies  Allergen Reactions  . Latex Itching    Review of Systems  Constitutional: Negative for fever and malaise/fatigue.  HENT: Negative for congestion.   Eyes: Negative for blurred vision.  Respiratory: Negative for cough and shortness of breath.   Cardiovascular: Negative for  chest pain, palpitations and leg swelling.  Gastrointestinal: Negative for vomiting.  Musculoskeletal: Negative for back pain.  Skin: Negative for rash.  Neurological: Negative for loss of consciousness and headaches.  Psychiatric/Behavioral: The patient is nervous/anxious and has insomnia.        Objective:    Physical Exam  Constitutional: She is oriented to person, place, and time. She appears well-developed and well-nourished. No distress.  HENT:  Head: Normocephalic and atraumatic.  Eyes: Conjunctivae are normal.  Neck: Normal range of motion. No thyromegaly present.  Cardiovascular: Normal rate and regular rhythm.   Pulmonary/Chest: Effort normal and breath sounds normal. She has no wheezes.  Abdominal: Soft. Bowel sounds are normal. There is no tenderness.  Musculoskeletal: Normal range  of motion. She exhibits no edema or deformity.  Neurological: She is alert and oriented to person, place, and time.  Skin: Skin is warm and dry. She is not diaphoretic.  Psychiatric: She has a normal mood and affect.    BP 92/62 (BP Location: Left Arm, Patient Position: Sitting, Cuff Size: Normal)   Pulse 68   Temp 98.3 F (36.8 C) (Oral)   Resp 18   Ht 4\' 11"  (1.499 m)   Wt 121 lb (54.9 kg)   SpO2 98%   BMI 24.44 kg/m  Wt Readings from Last 3 Encounters:  05/29/17 121 lb (54.9 kg)  01/08/17 132 lb 12.8 oz (60.2 kg)  04/25/16 121 lb 8 oz (55.1 kg)   BP Readings from Last 3 Encounters:  05/29/17 92/62  01/08/17 106/60  04/25/16 122/72     Immunization History  Administered Date(s) Administered  . Influenza,inj,Quad PF,6+ Mos 05/29/2017  . Influenza-Unspecified 06/30/2014    Health Maintenance  Topic Date Due  . HIV Screening  01/08/2018 (Originally 07/07/1989)  . TETANUS/TDAP  09/24/2018  . INFLUENZA VACCINE  Completed    Lab Results  Component Value Date   WBC 6.0 04/10/2017   HGB 14.7 04/10/2017   HCT 42.8 04/10/2017   PLT 263.0 04/10/2017   GLUCOSE 100 (H) 04/10/2017   CHOL 168 04/10/2017   TRIG 62.0 04/10/2017   HDL 49.10 04/10/2017   LDLCALC 107 (H) 04/10/2017   ALT 14 04/10/2017   AST 15 04/10/2017   NA 139 04/10/2017   K 3.9 04/10/2017   CL 105 04/10/2017   CREATININE 0.80 04/10/2017   BUN 14 04/10/2017   CO2 28 04/10/2017   TSH 1.28 04/10/2017    Lab Results  Component Value Date   TSH 1.28 04/10/2017   Lab Results  Component Value Date   WBC 6.0 04/10/2017   HGB 14.7 04/10/2017   HCT 42.8 04/10/2017   MCV 86.6 04/10/2017   PLT 263.0 04/10/2017   Lab Results  Component Value Date   NA 139 04/10/2017   K 3.9 04/10/2017   CO2 28 04/10/2017   GLUCOSE 100 (H) 04/10/2017   BUN 14 04/10/2017   CREATININE 0.80 04/10/2017   BILITOT 1.0 04/10/2017   ALKPHOS 49 04/10/2017   AST 15 04/10/2017   ALT 14 04/10/2017   PROT 6.6 04/10/2017    ALBUMIN 4.2 04/10/2017   CALCIUM 9.4 04/10/2017   ANIONGAP 12 07/30/2014   GFR 83.30 04/10/2017   Lab Results  Component Value Date   CHOL 168 04/10/2017   Lab Results  Component Value Date   HDL 49.10 04/10/2017   Lab Results  Component Value Date   LDLCALC 107 (H) 04/10/2017   Lab Results  Component Value Date   TRIG 62.0  04/10/2017   Lab Results  Component Value Date   CHOLHDL 3 04/10/2017   No results found for: HGBA1C       Assessment & Plan:   Problem List Items Addressed This Visit    ADD (attention deficit disorder)    She is struggling with increased stressors at work and at home it is leading to worsening trouble with concentration. Will try adding Vyvanse 10 mg tabs, 1 tab po daily and reassess next month. Patient aware of possible side effects.       Preventative health care    Flu shot given today      Depression with anxiety    Overall she is managing her high level of stress most days. Declines meds at this time. Has been using 5 HTP with some benefit. Counseled for 25 minutes of a 30 minute appointment.        Other Visit Diagnoses    Needs flu shot    -  Primary   Relevant Orders   Flu Vaccine QUAD 6+ mos PF IM (Fluarix Quad PF) (Completed)      I am having Ms. Loewe start on lisdexamfetamine.  Meds ordered this encounter  Medications  . lisdexamfetamine (VYVANSE) 10 MG capsule    Sig: Take 1 capsule (10 mg total) by mouth daily.    Dispense:  30 capsule    Refill:  0     Danise EdgeStacey Blyth, MD

## 2017-05-29 NOTE — Patient Instructions (Signed)

## 2017-06-03 NOTE — Assessment & Plan Note (Signed)
She is struggling with increased stressors at work and at home it is leading to worsening trouble with concentration. Will try adding Vyvanse 10 mg tabs, 1 tab po daily and reassess next month. Patient aware of possible side effects.

## 2017-06-03 NOTE — Assessment & Plan Note (Signed)
Flu shot given today

## 2017-06-03 NOTE — Assessment & Plan Note (Signed)
Overall she is managing her high level of stress most days. Declines meds at this time. Has been using 5 HTP with some benefit. Counseled for 25 minutes of a 30 minute appointment.

## 2017-06-18 ENCOUNTER — Encounter: Payer: Self-pay | Admitting: Family Medicine

## 2017-06-19 ENCOUNTER — Other Ambulatory Visit: Payer: Self-pay | Admitting: Family Medicine

## 2017-06-19 MED ORDER — FLUCONAZOLE 150 MG PO TABS: 150.0000 mg | ORAL_TABLET | ORAL | 0 refills | Status: DC

## 2017-06-26 ENCOUNTER — Ambulatory Visit (INDEPENDENT_AMBULATORY_CARE_PROVIDER_SITE_OTHER): Payer: 59 | Admitting: Family Medicine

## 2017-06-26 DIAGNOSIS — F418 Other specified anxiety disorders: Secondary | ICD-10-CM | POA: Diagnosis not present

## 2017-06-26 DIAGNOSIS — F988 Other specified behavioral and emotional disorders with onset usually occurring in childhood and adolescence: Secondary | ICD-10-CM | POA: Diagnosis not present

## 2017-06-26 DIAGNOSIS — G43809 Other migraine, not intractable, without status migrainosus: Secondary | ICD-10-CM

## 2017-06-26 MED ORDER — LISDEXAMFETAMINE DIMESYLATE 10 MG PO CAPS: 10.0000 mg | ORAL_CAPSULE | Freq: Every day | ORAL | 0 refills | Status: DC

## 2017-06-26 NOTE — Progress Notes (Signed)
Subjective:  I acted as a Neurosurgeon for Dr. Abner Greenspan. [place], [place]  Patient ID: Whitney Trujillo, female    DOB: Feb 12, 1976, 41 y.o.   MRN: [id number]  No chief complaint on file.   HPI  Patient is in today for follow up. She is doing well. No recent febrile illness or acute hospitalizations. She has tolerated the Vyvanse without any side effects like she experienced with other meds in past. No headaches, anxiety flares or palpitations etc. Her  Anxiety is improved with her improved focus. Denies CP/palp/SOB/HA/congestion/fevers/GI or GU c/o. Taking meds as prescribed  Patient Care Team: Bradd Canary, MD as PCP - General (Family Medicine) Ola Spurr., MD (Obstetrics and Gynecology)   Past Medical History:  Diagnosis Date  . Acute right lower quadrant pain 01/25/2016  . ADD (attention deficit disorder) 02/02/2015  . Allergy    seasonal allergies  . Anxiety   . Depression with anxiety 03/15/2016  . Endometriosis   . Gallstones   . Hyperlipidemia, mixed 02/02/2015  . Migraine 12/16/2015  . Preventative health care 12/16/2015  . Pulmonary emboli (HCC)    b/l PE after hysterectomy, 5 weeks post op, clotting problem ruled out at [facility]    Past Surgical History:  Procedure Laterality Date  . ABDOMINAL HYSTERECTOMY     ovaries removed, in 2 separate procedures  . CESAREAN SECTION  2007, 2009  . CHOLECYSTECTOMY    . CYSTOSCOPY  10/2014  . EYE SURGERY     lasik b/l  . LAPAROTOMY    . WISDOM TOOTH EXTRACTION      Family History  Problem Relation Age of Onset  . Cancer Father        Carcinoid Syndrome, colon  . Colon cancer Father   . Diabetes Mother        2, diet controlled  . ADD / ADHD Son   . Heart disease Maternal Grandmother   . Stroke Paternal Grandmother   . Dementia Paternal Grandmother   . COPD Paternal Grandfather        black lun, Forensic scientist, tobacco  . Ovarian cancer Maternal Aunt     Social History   Social History  . Marital status: Single    Spouse  name: N/A  . Number of children: N/A  . Years of education: N/A   Occupational History  . Not on file.   Social History Main Topics  . Smoking status: Never Smoker  . Smokeless tobacco: Never Used  . Alcohol use Yes     Comment: Socially  . Drug use: No  . 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 Not on file   Social History Narrative  . No narrative on file    Outpatient Medications Prior to Visit  Medication Sig Dispense Refill  . fluconazole (DIFLUCAN) 150 MG tablet Take 1 tablet (150 mg total) by mouth once a week. 2 tablet 0  . lisdexamfetamine (VYVANSE) 10 MG capsule Take 1 capsule (10 mg total) by mouth daily. 30 capsule 0   No facility-administered medications prior to visit.     Allergies  Allergen Reactions  . Latex Itching    Review of Systems  Constitutional: Negative for fever and malaise/fatigue.  HENT: Negative for congestion.   Eyes: Negative for blurred vision.  Respiratory: Negative for shortness of breath.   Cardiovascular:  Negative for chest pain, palpitations and leg swelling.  Gastrointestinal: Negative for abdominal pain, blood in stool and nausea.  Genitourinary: Negative for dysuria and frequency.  Musculoskeletal: Negative for falls.  Skin: Negative for rash.  Neurological: Negative for dizziness, loss of consciousness and headaches.  Endo/Heme/Allergies: Negative for environmental allergies.  Psychiatric/Behavioral: Negative for depression. The patient is not nervous/anxious.        Objective:    Physical Exam  Constitutional: She is oriented to person, place, and time. She appears well-developed and well-nourished. No distress.  HENT:  Head: Normocephalic and atraumatic.  Nose: Nose normal.  Eyes: Right eye exhibits no discharge. Left eye exhibits no discharge.  Neck: Normal range of motion. Neck supple.    Cardiovascular: Normal rate and regular rhythm.   No murmur heard. Pulmonary/Chest: Effort normal and breath sounds normal.  Abdominal: Soft. Bowel sounds are normal. There is no tenderness.  Musculoskeletal: She exhibits no edema.  Neurological: She is alert and oriented to person, place, and time.  Skin: Skin is warm and dry.  Psychiatric: She has a normal mood and affect.  Nursing note and vitals reviewed.   There were no vitals taken for this visit. Wt Readings from Last 3 Encounters:  05/29/17 121 lb (54.9 kg)  01/08/17 132 lb 12.8 oz (60.2 kg)  04/25/16 121 lb 8 oz (55.1 kg)   BP Readings from Last 3 Encounters:  05/29/17 92/62  01/08/17 106/60  04/25/16 122/72     Immunization History  Administered Date(s) Administered  . Influenza,inj,Quad PF,6+ Mos 05/29/2017  . Influenza-Unspecified 06/30/2014    Health Maintenance  Topic Date Due  . HIV Screening  01/08/2018 (Originally 07/07/1989)  . TETANUS/TDAP  09/24/2018  . INFLUENZA VACCINE  Completed    Lab Results  Component Value Date   WBC 6.0 04/10/2017   HGB 14.7 04/10/2017   HCT 42.8 04/10/2017   PLT 263.0 04/10/2017   GLUCOSE 100 (H) 04/10/2017   CHOL 168 04/10/2017   TRIG 62.0 04/10/2017   HDL 49.10 04/10/2017   LDLCALC 107 (H) 04/10/2017   ALT 14 04/10/2017   AST 15 04/10/2017   NA 139 04/10/2017   K 3.9 04/10/2017   CL 105 04/10/2017   CREATININE 0.80 04/10/2017   BUN 14 04/10/2017   CO2 28 04/10/2017   TSH 1.28 04/10/2017    Lab Results  Component Value Date   TSH 1.28 04/10/2017   Lab Results  Component Value Date   WBC 6.0 04/10/2017   HGB 14.7 04/10/2017   HCT 42.8 04/10/2017   MCV 86.6 04/10/2017   PLT 263.0 04/10/2017   Lab Results  Component Value Date   NA 139 04/10/2017   K 3.9 04/10/2017   CO2 28 04/10/2017   GLUCOSE 100 (H) 04/10/2017   BUN 14 04/10/2017   CREATININE 0.80 04/10/2017   BILITOT 1.0 04/10/2017   ALKPHOS 49 04/10/2017   AST 15 04/10/2017   ALT 14  04/10/2017   PROT 6.6 04/10/2017   ALBUMIN 4.2 04/10/2017   CALCIUM 9.4 04/10/2017   ANIONGAP 12 07/30/2014   GFR 83.30 04/10/2017   Lab Results  Component Value Date   CHOL 168 04/10/2017   Lab Results  Component Value Date   HDL 49.10 04/10/2017   Lab Results  Component Value Date   LDLCALC 107 (H) 04/10/2017   Lab Results  Component Value Date   TRIG 62.0 04/10/2017   Lab Results  Component Value Date   CHOLHDL 3 04/10/2017   No  results found for: HGBA1C       Assessment & Plan:   Problem List Items Addressed This Visit    ADD (attention deficit disorder)    Good response to vyvanse and able to focus much better, no concerning side effects. Is given refill today and will notify us of any concerns.       Migraine    No flares. Encouraged increased hydration, 64 ounces of clear fluids daily. Minimize alcohol and caffeine. Eat small frequent meals with lean proteins and complex carbs. Avoid high and low blood sugars. Get adequate sleep, 7-8 hours a night. Needs exercise daily preferably in the morning.      Depression with anxiety    Notes her anxiety is much better with her ADD treated         I have discontinued Ms. Punch's lisdexamfetamine. I am also having her start on lisdexamfetamine, lisdexamfetamine, and lisdexamfetamine. Additionally, I am having her maintain her fluconazole.  Meds ordered this encounter  Medications  . lisdexamfetamine (VYVANSE) 10 MG capsule    Sig: Take 1 capsule (10 mg total) by mouth daily before breakfast. October 2018    Dispense:  30 capsule    Refill:  0  . lisdexamfetamine (VYVANSE) 10 MG capsule    Sig: Take 1 capsule (10 mg total) by mouth daily before breakfast. November 2018    Dispense:  30 capsule    Refill:  0  . lisdexamfetamine (VYVANSE) 10 MG capsule    Sig: Take 1 capsule (10 mg total) by mouth daily before breakfast. December 2018    Dispense:  30 capsule    Refill:  0    CMA served as Neurosurgeon during  this visit. History, Physical and Plan performed by medical provider. Documentation and orders reviewed and attested to.  Danise Edge, MD

## 2017-06-26 NOTE — Patient Instructions (Signed)
Ginger tea or caps  Nausea, Adult Nausea is the feeling of an upset stomach or having to vomit. Nausea on its own is not usually a serious concern, but it may be an early sign of a more serious medical problem. As nausea gets worse, it can lead to vomiting. If vomiting develops, or if you are not able to drink enough fluids, you are at risk of becoming dehydrated. Dehydration can make you tired and thirsty, cause you to have a dry mouth, and decrease how often you urinate. Older adults and people with other diseases or a weak immune system are at higher risk for dehydration. The main goals of treating your nausea are:  To limit repeated nausea episodes.  To prevent vomiting and dehydration.  Follow these instructions at home: Follow instructions from your health care provider about how to care for yourself at home. Eating and drinking Follow these recommendations as told by your health care provider:  Take an oral rehydration solution (ORS). This is a drink that is sold at pharmacies and retail stores.  Drink clear fluids in small amounts as you are able. Clear fluids include water, ice chips, diluted fruit juice, and low-calorie sports drinks.  Eat bland, easy-to-digest foods in small amounts as you are able. These foods include bananas, applesauce, rice, lean meats, toast, and crackers.  Avoid drinking fluids that contain a lot of sugar or caffeine, such as energy drinks, sports drinks, and soda.  Avoid alcohol.  Avoid spicy or fatty foods.  General instructions  Drink enough fluid to keep your urine clear or pale yellow.  Wash your hands often. If soap and water are not available, use hand sanitizer.  Make sure that all people in your household wash their hands well and often.  Rest at home while you recover.  Take over-the-counter and prescription medicines only as told by your health care provider.  Breathe slowly and deeply when you feel nauseous.  Watch your condition  for any changes.  Keep all follow-up visits as told by your health care provider. This is important. Contact a health care provider if:  You have a headache.  You have new symptoms.  Your nausea gets worse.  You have a fever.  You feel light-headed or dizzy.  You vomit.  You cannot keep fluids down. Get help right away if:  You have pain in your chest, neck, arm, or jaw.  You feel extremely weak or you faint.  You have vomit that is bright red or looks like coffee grounds.  You have bloody or black stools or stools that look like tar.  You have a severe headache, a stiff neck, or both.  You have severe pain, cramping, or bloating in your abdomen.  You have a rash.  You have difficulty breathing or are breathing very quickly.  Your heart is beating very quickly.  Your skin feels cold and clammy.  You feel confused.  You have pain when you urinate.  You have signs of dehydration, such as: ? Dark urine, very little, or no urine. ? Cracked lips. ? Dry mouth. ? Sunken eyes. ? Sleepiness. ? Weakness. These symptoms may represent a serious problem that is an emergency. Do not wait to see if the symptoms will go away. Get medical help right away. Call your local emergency services (911 in the [place] Do not drive yourself to the hospital. This information is not intended to replace advice given to you by your health care provider. Make sure you  discuss any questions you have with your health care provider. Document Released: 10/19/2004 Document Revised: 02/14/2016 Document Reviewed: 05/18/2015 Elsevier Interactive Patient Education  2017 [employer].

## 2017-07-01 NOTE — Assessment & Plan Note (Signed)
Notes her anxiety is much better with her ADD treated

## 2017-07-01 NOTE — Assessment & Plan Note (Signed)
No flares. Encouraged increased hydration, 64 ounces of clear fluids daily. Minimize alcohol and caffeine. Eat small frequent meals with lean proteins and complex carbs. Avoid high and low blood sugars. Get adequate sleep, 7-8 hours a night. Needs exercise daily preferably in the morning.

## 2017-07-01 NOTE — Assessment & Plan Note (Signed)
Good response to vyvanse and able to focus much better, no concerning side effects. Is given refill today and will notify us of any concerns.

## 2017-07-08 ENCOUNTER — Encounter: Payer: Self-pay | Admitting: Family Medicine

## 2017-07-09 ENCOUNTER — Other Ambulatory Visit: Payer: Self-pay | Admitting: Family Medicine

## 2017-07-09 MED ORDER — FLUOXETINE HCL 20 MG PO TABS: 20.0000 mg | ORAL_TABLET | Freq: Every day | ORAL | 3 refills | Status: DC

## 2017-07-23 ENCOUNTER — Encounter: Payer: Self-pay | Admitting: Family Medicine

## 2017-08-07 DIAGNOSIS — R232 Flushing: Secondary | ICD-10-CM | POA: Diagnosis not present

## 2017-08-07 DIAGNOSIS — R5383 Other fatigue: Secondary | ICD-10-CM | POA: Diagnosis not present

## 2017-08-07 DIAGNOSIS — G479 Sleep disorder, unspecified: Secondary | ICD-10-CM | POA: Diagnosis not present

## 2017-08-14 ENCOUNTER — Ambulatory Visit: Payer: Self-pay | Admitting: Family Medicine

## 2017-08-27 ENCOUNTER — Encounter: Payer: Self-pay | Admitting: Family Medicine

## 2017-08-28 ENCOUNTER — Other Ambulatory Visit: Payer: Self-pay | Admitting: Family Medicine

## 2017-08-28 MED ORDER — FLUOXETINE HCL 10 MG PO CAPS: 10.0000 mg | ORAL_CAPSULE | Freq: Every day | ORAL | 1 refills | Status: DC

## 2017-09-21 ENCOUNTER — Ambulatory Visit: Payer: Self-pay | Admitting: Family Medicine

## 2017-09-25 HISTORY — PX: UMBILICAL HERNIA REPAIR: SUR1181

## 2017-10-03 ENCOUNTER — Other Ambulatory Visit: Payer: Self-pay | Admitting: Family Medicine

## 2017-10-03 DIAGNOSIS — Z1231 Encounter for screening mammogram for malignant neoplasm of breast: Secondary | ICD-10-CM

## 2017-10-09 ENCOUNTER — Ambulatory Visit: Payer: Self-pay | Admitting: Family Medicine

## 2017-10-19 ENCOUNTER — Encounter: Payer: Self-pay | Admitting: Family Medicine

## 2017-10-19 ENCOUNTER — Ambulatory Visit: Payer: 59 | Admitting: Family Medicine

## 2017-10-19 VITALS — BP 108/62 | HR 65 | Temp 97.9°F | Ht 59.0 in | Wt 122.4 lb

## 2017-10-19 DIAGNOSIS — S46819A Strain of other muscles, fascia and tendons at shoulder and upper arm level, unspecified arm, initial encounter: Secondary | ICD-10-CM | POA: Diagnosis not present

## 2017-10-19 DIAGNOSIS — G5603 Carpal tunnel syndrome, bilateral upper limbs: Secondary | ICD-10-CM

## 2017-10-19 MED ORDER — CYCLOBENZAPRINE HCL 10 MG PO TABS: 10.0000 mg | ORAL_TABLET | Freq: Three times a day (TID) | ORAL | 0 refills | Status: DC | PRN

## 2017-10-19 MED ORDER — TRAMADOL HCL 50 MG PO TABS: 50.0000 mg | ORAL_TABLET | Freq: Three times a day (TID) | ORAL | 0 refills | Status: DC | PRN

## 2017-10-19 NOTE — Progress Notes (Signed)
Musculoskeletal Exam  Patient: Whitney Trujillo DOB: 1975/11/01  DOS: 10/19/2017  SUBJECTIVE:  Chief Complaint:   Chief Complaint  Patient presents with  . Back Pain    Whitney Trujillo is a 42 y.o.  female for evaluation and treatment of back pain.   Onset:  1 day ago. Was doing a client's hair.  Location: Upper back Character:  aching and sharp  Progression of issue:  is unchanged Associated symptoms: Numbness, tingling in hands Pain is constant, numbness and tingling is intermittent Treatment: to date has been OTC NSAIDS and heat0 Provacation: certain movements. 8-9/10 during movements Palliation: Heat a little   Neurovascular symptoms: denies weakness  ROS: Musculoskeletal/Extremities: +upper back pain Neuro: +numbness/tingling in hands  Past Medical History:  Diagnosis Date  . Acute right lower quadrant pain 01/25/2016  . ADD (attention deficit disorder) 02/02/2015  . Allergy    seasonal allergies  . Anxiety   . Depression with anxiety 03/15/2016  . Endometriosis   . Gallstones   . Hyperlipidemia, mixed 02/02/2015  . Migraine 12/16/2015  . Preventative health care 12/16/2015  . Pulmonary emboli (HCC)    b/l PE after hysterectomy, 5 weeks post op, [facility]   Family History  Problem Relation Age of Onset  . Cancer Father        Carcinoid Syndrome, colon  . Colon cancer Father   . Diabetes Mother        2, diet controlled  . ADD / ADHD Son   . Heart disease Maternal Grandmother   . Stroke Paternal Grandmother   . Dementia Paternal Grandmother   . COPD Paternal Grandfather        black lun, coal miner, tobacco  . Ovarian cancer Maternal Aunt    Allergies  Allergen Reactions  . Latex Itching   Past Surgical History:  Procedure Laterality Date  . ABDOMINAL HYSTERECTOMY     ovaries removed, in 2 separate procedures  . CESAREAN SECTION  2007, 2009  . CHOLECYSTECTOMY    . CYSTOSCOPY  10/2014  . EYE SURGERY     lasik b/l  .  LAPAROTOMY    . WISDOM TOOTH EXTRACTION     Objective: VITAL SIGNS: BP 108/62 (BP Location: Left Arm, Patient Position: Sitting, Cuff Size: Normal)   Pulse 65   Temp 97.9 F (36.6 C) (Oral)   Ht 4\' 11"  (1.499 m)   Wt 122 lb 6 oz (55.5 kg)   SpO2 97%   BMI 24.72 kg/m  Constitutional: Well formed, well developed. No acute distress. Cardiovascular: Brisk cap refill Thorax & Lungs: No accessory muscle use Musculoskeletal:  Tenderness to palpation: yes; over traps b/l and paraspinal msk from upper thoracic region to lumbar region Deformity: no Ecchymosis: no Tests positive: Tinel's b/l, Phalen's b/l Tests negative: Spurling's Neurologic: Decreased sensation over median nerve distribution in b/l hands; No focal deficits noted. DTR's equal and symmetry in UE's. No clonus. Psychiatric: Normal mood. Age appropriate judgment and insight. Alert & oriented x 3.    Assessment:  Bilateral carpal tunnel syndrome  Strain of trapezius muscle, unspecified laterality, initial encounter - Plan: traMADol (ULTRAM) 50 MG tablet, cyclobenzaprine (FLEXERIL) 10 MG tablet  Plan: Orders as above. Taught stretches/exercises for wrists, generic splints given w rec for cock up splint if this is not helpful. Above meds w warning precautions, heat, ice, NSAIDs, Tylenol, stretches/exercises. F/u prn. The patient voiced understanding and agreement to the plan.   Jilda Rocheicholas Paul ArcadiaWendling, DO 10/19/17  2:49 PM

## 2017-10-19 NOTE — Progress Notes (Signed)
Pre visit review using our clinic review tool, if applicable. No additional management support is needed unless otherwise documented below in the visit note. 

## 2017-10-19 NOTE — Patient Instructions (Addendum)
Heat (pad or rice pillow in microwave) over affected area, 10-15 minutes every 2-3 hours while awake.   Ice/cold pack over area for 10-15 min every 2-3 hours while awake.  Ibuprofen 400-600 mg (2-3 over the counter strength tabs) every 6 hours as needed for pain.  OK to take Tylenol 1000 mg (2 extra strength tabs) or 975 mg (3 regular strength tabs) every 6 hours as needed.  Stretch wrist, 30 seconds each side, repeat twice per side. Have your husband massage the area over your wrist.  Wear splints at night. A wrist cock up splint may be more beneficial if these generic type wrist splints don't help. If this is not helpful, we may need to consider injections.  Do not drink alcohol, do any illicit/street drugs, drive or do anything that requires alertness while on this medicine (tramadol).  Take Flexeril (cyclobenzaprine) 1-2 hours before planned bedtime. If it makes you drowsy, do not take during the day. You can try half a tab the following night.    Trapezius stretches/exercises Do exercises exactly as told by your health care provider and adjust them as directed. It is normal to feel mild stretching, pulling, tightness, or discomfort as you do these exercises, but you should stop right away if you feel sudden pain or your pain gets worse.  Stretching and range of motion exercises These exercises warm up your muscles and joints and improve the movement and flexibility of your shoulder. These exercises can also help to relieve pain, numbness, and tingling. If you are unable to do any of the following for any reason, do not further attempt to do it.   Exercise A: Flexion, standing    1. Stand and hold a broomstick, a cane, or a similar object. Place your hands a little more than shoulder-width apart on the object. Your left / right hand should be palm-up, and your other hand should be palm-down. 2. Push the stick to raise your left / right arm out to your side and then over your head.  Use your other hand to help move the stick. Stop when you feel a stretch in your shoulder, or when you reach the angle that is recommended by your health care provider. ? Avoid shrugging your shoulder while you raise your arm. Keep your shoulder blade tucked down toward your spine. 3. Hold for 30 seconds. 4. Slowly return to the starting position. Repeat 2 times. Complete this exercise 3 times per week.  Exercise B: Abduction, supine    1. Lie on your back and hold a broomstick, a cane, or a similar object. Place your hands a little more than shoulder-width apart on the object. Your left / right hand should be palm-up, and your other hand should be palm-down. 2. Push the stick to raise your left / right arm out to your side and then over your head. Use your other hand to help move the stick. Stop when you feel a stretch in your shoulder, or when you reach the angle that is recommended by your health care provider. ? Avoid shrugging your shoulder while you raise your arm. Keep your shoulder blade tucked down toward your spine. 3. Hold for 30 seconds. 4. Slowly return to the starting position. Repeat 2 times. Complete this exercise 3 times per week.  Exercise C: Flexion, active-assisted    1. Lie on your back. You may bend your knees for comfort. 2. Hold a broomstick, a cane, or a similar object. Place your hands about  shoulder-width apart on the object. Your palms should face toward your feet. 3. Raise the stick and move your arms over your head and behind your head, toward the floor. Use your healthy arm to help your left / right arm move farther. Stop when you feel a gentle stretch in your shoulder, or when you reach the angle where your health care provider tells you to stop. 4. Hold for 30 seconds. 5. Slowly return to the starting position. Repeat 2 times. Complete this exercise 3 times per week.  Exercise D: External rotation and abduction    1. Stand in a door frame with one of  your feet slightly in front of the other. This is called a staggered stance. 2. Choose one of the following positions as told by your health care provider: ? Place your hands and forearms on the door frame above your head. ? Place your hands and forearms on the door frame at the height of your head. ? Place your hands on the door frame at the height of your elbows. 3. Slowly move your weight onto your front foot until you feel a stretch across your chest and in the front of your shoulders. Keep your head and chest upright and keep your abdominal muscles tight. 4. Hold for 30 seconds. 5. To release the stretch, shift your weight to your back foot. Repeat 2 times. Complete this stretch 3 times per week.  Strengthening exercises These exercises build strength and endurance in your shoulder. Endurance is the ability to use your muscles for a long time, even after your muscles get tired. Exercise E: Scapular depression and adduction  1. Sit on a stable chair. Support your arms in front of you with pillows, armrests, or a tabletop. Keep your elbows in line with the sides of your body. 2. Gently move your shoulder blades down toward your middle back. Relax the muscles on the tops of your shoulders and in the back of your neck. 3. Hold for 3 seconds. 4. Slowly release the tension and relax your muscles completely before doing this exercise again. Repeat for a total of 10 repetitions. 5. After you have practiced this exercise, try doing the exercise without the arm support. Then, try the exercise while standing instead of sitting. Repeat 2 times. Complete this exercise 3 times per week.  Exercise F: Shoulder abduction, isometric    1. Stand or sit about 4-6 inches (10-15 cm) from a wall with your left / right side facing the wall. 2. Bend your left / right elbow and gently press your elbow against the wall. 3. Increase the pressure slowly until you are pressing as hard as you can without shrugging  your shoulder. 4. Hold for 3 seconds. 5. Slowly release the tension and relax your muscles completely. Repeat for a total of 10 repetitions. Repeat 2 times. Complete this exercise 3 times per week.  Exercise G: Shoulder flexion, isometric    1. Stand or sit about 4-6 inches (10-15 cm) away from a wall with your left / right side facing the wall. 2. Keep your left / right elbow straight and gently press the top of your fist against the wall. Increase the pressure slowly until you are pressing as hard as you can without shrugging your shoulder. 3. Hold for 10-15 seconds. 4. Slowly release the tension and relax your muscles completely. Repeat for a total of 10 repetitions. Repeat 2 times. Complete this exercise 3 times per week.  Exercise H: Internal rotation  1. Sit in a stable chair without armrests, or stand. Secure an exercise band at your left / right side, at elbow height. 2. Place a soft object, such as a folded towel or a small pillow, under your left / right upper arm so your elbow is a few inches (about 8 cm) away from your side. 3. Hold the end of the exercise band so the band stretches. 4. Keeping your elbow pressed against the soft object under your arm, move your forearm across your body toward your abdomen. Keep your body steady so the movement is only coming from your shoulder. 5. Hold for 3 seconds. 6. Slowly return to the starting position. Repeat for a total of 10 repetitions. Repeat 2 times. Complete this exercise 3 times per week.  Exercise I: External rotation    1. Sit in a stable chair without armrests, or stand. 2. Secure an exercise band at your left / right side, at elbow height. 3. Place a soft object, such as a folded towel or a small pillow, under your left / right upper arm so your elbow is a few inches (about 8 cm) away from your side. 4. Hold the end of the exercise band so the band stretches. 5. Keeping your elbow pressed against the soft object  under your arm, move your forearm out, away from your abdomen. Keep your body steady so the movement is only coming from your shoulder. 6. Hold for 3 seconds. 7. Slowly return to the starting position. Repeat for a total of 10 repetitions. Repeat 2 times. Complete this exercise 3 times per week. Exercise J: Shoulder extension  1. Sit in a stable chair without armrests, or stand. Secure an exercise band to a stable object in front of you so the band is at shoulder height. 2. Hold one end of the exercise band in each hand. Your palms should face each other. 3. Straighten your elbows and lift your hands up to shoulder height. 4. Step back, away from the secured end of the exercise band, until the band stretches. 5. Squeeze your shoulder blades together and pull your hands down to the sides of your thighs. Stop when your hands are straight down by your sides. Do not let your hands go behind your body. 6. Hold for 3 seconds. 7. Slowly return to the starting position. Repeat for a total of 10 repetitions. Repeat 2 times. Complete this exercise 3 times per week.  Exercise K: Shoulder extension, prone    1. Lie on your abdomen on a firm surface so your left / right arm hangs over the edge. 2. Hold a 5 lb weight in your hand so your palm faces in toward your body. Your arm should be straight. 3. Squeeze your shoulder blade down toward the middle of your back. 4. Slowly raise your arm behind you, up to the height of the surface that you are lying on. Keep your arm straight. 5. Hold for 3 seconds. 6. Slowly return to the starting position and relax your muscles. Repeat for a total of 10 repetitions. Repeat 2 times. Complete this exercise 3 times per week.   Exercise L: Horizontal abduction, prone  1. Lie on your abdomen on a firm surface so your left / right arm hangs over the edge. 2. Hold a 5 lb weight in your hand so your palm faces toward your feet. Your arm should be straight. 3. Squeeze your  shoulder blade down toward the middle of your back. 4. [place]  your elbow so your hand moves up, until your elbow is bent to an "L" shape (90 degrees). With your elbow bent, slowly move your forearm forward and up. Raise your hand up to the height of the surface that you are lying on. ? Your upper arm should not move, and your elbow should stay bent. ? At the top of the movement, your palm should face the floor. 5. Hold for 3 seconds. 6. Slowly return to the starting position and relax your muscles. Repeat for a total of 10 repetitions. Repeat 2 times. Complete this exercise 3 times per week.  Exercise M: Horizontal abduction, standing  1. Sit on a stable chair, or stand. 2. Secure an exercise band to a stable object in front of you so the band is at shoulder height. 3. Hold one end of the exercise band in each hand. 4. Straighten your elbows and lift your hands straight in front of you, up to shoulder height. Your palms should face down, toward the floor. 5. Step back, away from the secured end of the exercise band, until the band stretches. 6. Move your arms out to your sides, and keep your arms straight. 7. Hold for 3 seconds. 8. Slowly return to the starting position. Repeat for a total of 10 repetitions. Repeat 2 times. Complete this exercise 3 times per week.  Exercise N: Scapular retraction and elevation  1. Sit on a stable chair, or stand. 2. Secure an exercise band to a stable object in front of you so the band is at shoulder height. 3. Hold one end of the exercise band in each hand. Your palms should face each other. 4. Sit in a stable chair without armrests, or stand. 5. Step back, away from the secured end of the exercise band, until the band stretches. 6. Squeeze your shoulder blades together and lift your hands over your head. Keep your elbows straight. 7. Hold for 3 seconds. 8. Slowly return to the starting position. Repeat for a total of 10 repetitions. Repeat 2 times.  Complete this exercise 3 times per week.  This information is not intended to replace advice given to you by your health care provider. Make sure you discuss any questions you have with your health care provider. Document Released: 09/11/2005 Document Revised: 05/18/2016 Document Reviewed: 07/29/2015 Elsevier Interactive Patient Education  2017 [employer].

## 2017-10-30 ENCOUNTER — Ambulatory Visit: Payer: 59 | Admitting: Family Medicine

## 2017-11-01 ENCOUNTER — Encounter: Payer: Self-pay | Admitting: Family Medicine

## 2017-11-01 ENCOUNTER — Ambulatory Visit: Payer: 59 | Admitting: Family Medicine

## 2017-11-01 VITALS — BP 108/55 | HR 64 | Resp 16 | Ht 59.0 in | Wt 123.4 lb

## 2017-11-01 DIAGNOSIS — E559 Vitamin D deficiency, unspecified: Secondary | ICD-10-CM | POA: Diagnosis not present

## 2017-11-01 DIAGNOSIS — F988 Other specified behavioral and emotional disorders with onset usually occurring in childhood and adolescence: Secondary | ICD-10-CM

## 2017-11-01 DIAGNOSIS — E782 Mixed hyperlipidemia: Secondary | ICD-10-CM | POA: Diagnosis not present

## 2017-11-01 DIAGNOSIS — N809 Endometriosis, unspecified: Secondary | ICD-10-CM | POA: Diagnosis not present

## 2017-11-01 DIAGNOSIS — F418 Other specified anxiety disorders: Secondary | ICD-10-CM

## 2017-11-01 DIAGNOSIS — G43809 Other migraine, not intractable, without status migrainosus: Secondary | ICD-10-CM | POA: Diagnosis not present

## 2017-11-01 DIAGNOSIS — Z0000 Encounter for general adult medical examination without abnormal findings: Secondary | ICD-10-CM

## 2017-11-01 NOTE — Assessment & Plan Note (Signed)
Doing well on current meds.

## 2017-11-01 NOTE — Assessment & Plan Note (Signed)
Encouraged heart healthy diet, increase exercise, avoid trans fats, consider a krill oil cap daily 

## 2017-11-01 NOTE — Patient Instructions (Signed)

## 2017-11-01 NOTE — Assessment & Plan Note (Addendum)
Doing well with marriage counseling, has started CBD oil 15 mg capsule. Does not feel she needs prescription meds at this time.

## 2017-11-01 NOTE — Progress Notes (Signed)
Subjective:    Patient ID: Whitney Trujillo, female    DOB: 1976-04-15, 42 y.o.   MRN: [id number]  Chief Complaint  Patient presents with  . Medication Follow Up    folllow up, vyvanse and prozac--stopped taking, causing fatigue and depression, using cbd oil helping    HPI Patient is in today for follow up and she is feeling well. She is in marriage counseling now and she feels it is helping. She is no longer needing Fluoxetine and she has started CBD capsules from [employer] and she feels that is helping. Denies CP/palp/SOB/HA/congestion/fevers/GI or GU c/o. Taking meds as prescribed  Past Medical History:  Diagnosis Date  . Acute right lower quadrant pain 01/25/2016  . ADD (attention deficit disorder) 02/02/2015  . Allergy    seasonal allergies  . Anxiety   . Depression with anxiety 03/15/2016  . Endometriosis   . Gallstones   . Hyperlipidemia, mixed 02/02/2015  . Migraine 12/16/2015  . Preventative health care 12/16/2015  . Pulmonary emboli (HCC)    b/l PE after hysterectomy, 5 weeks post op, [facility]    Past Surgical History:  Procedure Laterality Date  . ABDOMINAL HYSTERECTOMY     ovaries removed, in 2 separate procedures  . CESAREAN SECTION  2007, 2009  . CHOLECYSTECTOMY    . CYSTOSCOPY  10/2014  . EYE SURGERY     lasik b/l  . LAPAROTOMY    . WISDOM TOOTH EXTRACTION      Family History  Problem Relation Age of Onset  . Cancer Father        Carcinoid Syndrome, colon  . Colon cancer Father   . Diabetes Mother        2, diet controlled  . ADD / ADHD Son   . Heart disease Maternal Grandmother   . Stroke Paternal Grandmother   . Dementia Paternal Grandmother   . COPD Paternal Grandfather        black lun, Forensic scientistcoal miner, tobacco  . Ovarian cancer Maternal Aunt     Social History   Socioeconomic History  . Marital status: Married    Spouse name: Not on file  . Number of children: Not on file  . Years of education: Not on file    . Highest education level: Not on file  Social Needs  . Financial resource strain: Not on file  . Food insecurity - worry: Not on file  . Food insecurity - inability: Not on file  . Transportation needs - medical: Not on file  . Transportation needs - non-medical: Not on file  Occupational History  . Not on file  Tobacco Use  . Smoking status: Never Smoker  . Smokeless tobacco: Never Used  Substance and Sexual Activity  . Alcohol use: Yes    Comment: Socially  . Drug use: No  . 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  Other Topics Concern  . Not on file  Social History Narrative  . Not on file    Outpatient Medications Prior to Visit  Medication Sig Dispense Refill  . cyclobenzaprine (FLEXERIL) 10 MG tablet Take 1 tablet (10 mg total) by mouth 3 (three) times daily as needed for muscle spasms. 21 tablet 0  . fluconazole (DIFLUCAN) 150 MG tablet Take 1 tablet (150 mg total) by mouth once a week. (Patient not taking: Reported on 10/19/2017)  2 tablet 0  . FLUoxetine (PROZAC) 10 MG capsule Take 1 capsule (10 mg total) by mouth daily. 30 capsule 1  . lisdexamfetamine (VYVANSE) 10 MG capsule Take 1 capsule (10 mg total) by mouth daily before breakfast. October 2018 30 capsule 0  . lisdexamfetamine (VYVANSE) 10 MG capsule Take 1 capsule (10 mg total) by mouth daily before breakfast. November 2018 30 capsule 0  . lisdexamfetamine (VYVANSE) 10 MG capsule Take 1 capsule (10 mg total) by mouth daily before breakfast. December 2018 30 capsule 0  . traMADol (ULTRAM) 50 MG tablet Take 1 tablet (50 mg total) by mouth every 8 (eight) hours as needed. 15 tablet 0   No facility-administered medications prior to visit.     Allergies  Allergen Reactions  . Latex Itching    Review of Systems  Constitutional: Negative for fever and malaise/fatigue.  HENT: Negative for congestion.   Eyes:  Negative for blurred vision.  Respiratory: Negative for shortness of breath.   Cardiovascular: Negative for chest pain, palpitations and leg swelling.  Gastrointestinal: Negative for abdominal pain, blood in stool and nausea.  Genitourinary: Negative for dysuria and frequency.  Musculoskeletal: Negative for falls.  Skin: Negative for rash.  Neurological: Negative for dizziness, loss of consciousness and headaches.  Endo/Heme/Allergies: Negative for environmental allergies.  Psychiatric/Behavioral: Negative for depression. The patient is not nervous/anxious.        Objective:    Physical Exam  Constitutional: She is oriented to person, place, and time. She appears well-developed and well-nourished. No distress.  HENT:  Head: Normocephalic and atraumatic.  Nose: Nose normal.  Eyes: Right eye exhibits no discharge. Left eye exhibits no discharge.  Neck: Normal range of motion. Neck supple.  Cardiovascular: Normal rate and regular rhythm.  No murmur heard. Pulmonary/Chest: Effort normal and breath sounds normal.  Abdominal: Soft. Bowel sounds are normal. There is no tenderness.  Musculoskeletal: She exhibits no edema.  Neurological: She is alert and oriented to person, place, and time.  Skin: Skin is warm and dry.  Psychiatric: She has a normal mood and affect.  Nursing note and vitals reviewed.   BP (!) 108/55 (BP Location: Left Arm, Patient Position: Sitting, Cuff Size: Normal)   Pulse 64   Resp 16   Ht 4\' 11"  (1.499 m)   Wt 123 lb 6.4 oz (56 kg)   SpO2 100%   BMI 24.92 kg/m  Wt Readings from Last 3 Encounters:  11/01/17 123 lb 6.4 oz (56 kg)  10/19/17 122 lb 6 oz (55.5 kg)  05/29/17 121 lb (54.9 kg)     Lab Results  Component Value Date   WBC 6.0 04/10/2017   HGB 14.7 04/10/2017   HCT 42.8 04/10/2017   PLT 263.0 04/10/2017   GLUCOSE 100 (H) 04/10/2017   CHOL 168 04/10/2017   TRIG 62.0 04/10/2017   HDL 49.10 04/10/2017   LDLCALC 107 (H) 04/10/2017   ALT 14  04/10/2017   AST 15 04/10/2017   NA 139 04/10/2017   K 3.9 04/10/2017   CL 105 04/10/2017   CREATININE 0.80 04/10/2017   BUN 14 04/10/2017   CO2 28 04/10/2017   TSH 1.28 04/10/2017    Lab Results  Component Value Date   TSH 1.28 04/10/2017   Lab Results  Component Value Date   WBC 6.0 04/10/2017   HGB 14.7 04/10/2017   HCT 42.8 04/10/2017   MCV 86.6 04/10/2017   PLT 263.0 04/10/2017   Lab Results  Component Value Date  NA 139 04/10/2017   K 3.9 04/10/2017   CO2 28 04/10/2017   GLUCOSE 100 (H) 04/10/2017   BUN 14 04/10/2017   CREATININE 0.80 04/10/2017   BILITOT 1.0 04/10/2017   ALKPHOS 49 04/10/2017   AST 15 04/10/2017   ALT 14 04/10/2017   PROT 6.6 04/10/2017   ALBUMIN 4.2 04/10/2017   CALCIUM 9.4 04/10/2017   ANIONGAP 12 07/30/2014   GFR 83.30 04/10/2017   Lab Results  Component Value Date   CHOL 168 04/10/2017   Lab Results  Component Value Date   HDL 49.10 04/10/2017   Lab Results  Component Value Date   LDLCALC 107 (H) 04/10/2017   Lab Results  Component Value Date   TRIG 62.0 04/10/2017   Lab Results  Component Value Date   CHOLHDL 3 04/10/2017   No results found for: HGBA1C     Assessment & Plan:   Problem List Items Addressed This Visit    Endometriosis    Hormone implants from [facility] in [place] helps all perimenopausal symptoms, does testosterone every 3 month and intermittent estrogen.       Relevant Orders   CBC   Hyperlipidemia, mixed    Encouraged heart healthy diet, increase exercise, avoid trans fats, consider a krill oil cap daily      Relevant Orders   Lipid panel   Vitamin D deficiency    Taking daily supplements, check level      Relevant Orders   Comprehensive metabolic panel   VITAMIN D 25 Hydroxy (Vit-D Deficiency, Fractures)   ADD (attention deficit disorder)    Doing well on current meds      Preventative health care - Primary   Relevant Orders   CBC   Comprehensive metabolic panel   TSH    Migraine     Headaches doing well at the current time      Depression with anxiety    Doing well with marriage counseling, has started CBD oil 15 mg capsule. Does not feel she needs prescription meds at this time.         I have discontinued Masey A. Sturgell's fluconazole, lisdexamfetamine, lisdexamfetamine, lisdexamfetamine, FLUoxetine, traMADol, and cyclobenzaprine.  No orders of the defined types were placed in this encounter.    Danise Edge, MD

## 2017-11-01 NOTE — Assessment & Plan Note (Addendum)
[facility] [place] helps all perimenopausal symptoms, does testosterone every 3 month and intermittent estrogen.

## 2017-11-01 NOTE — Assessment & Plan Note (Signed)
Taking daily supplements, check level

## 2017-11-03 ENCOUNTER — Ambulatory Visit (HOSPITAL_BASED_OUTPATIENT_CLINIC_OR_DEPARTMENT_OTHER)
Admission: RE | Admit: 2017-11-03 | Discharge: 2017-11-03 | Disposition: A | Discharge status: Home / Self Care | Payer: 59 | Source: Ambulatory Visit | Attending: Family Medicine | Admitting: Family Medicine

## 2017-11-03 DIAGNOSIS — Z1231 Encounter for screening mammogram for malignant neoplasm of breast: Secondary | ICD-10-CM

## 2017-11-04 NOTE — Assessment & Plan Note (Signed)
Headaches doing well at the current time

## 2017-11-05 DIAGNOSIS — R232 Flushing: Secondary | ICD-10-CM | POA: Diagnosis not present

## 2017-11-05 DIAGNOSIS — G479 Sleep disorder, unspecified: Secondary | ICD-10-CM | POA: Diagnosis not present

## 2017-11-05 DIAGNOSIS — R5383 Other fatigue: Secondary | ICD-10-CM | POA: Diagnosis not present

## 2017-11-07 ENCOUNTER — Telehealth: Payer: Self-pay | Admitting: *Deleted

## 2017-11-07 NOTE — Telephone Encounter (Signed)
[facility]; forwarded to provider/SLS 02/13

## 2017-11-08 DIAGNOSIS — R232 Flushing: Secondary | ICD-10-CM | POA: Diagnosis not present

## 2017-11-08 DIAGNOSIS — N898 Other specified noninflammatory disorders of vagina: Secondary | ICD-10-CM | POA: Diagnosis not present

## 2017-11-08 DIAGNOSIS — N951 Menopausal and female climacteric states: Secondary | ICD-10-CM | POA: Diagnosis not present

## 2017-11-08 DIAGNOSIS — R6882 Decreased libido: Secondary | ICD-10-CM | POA: Diagnosis not present

## 2017-11-13 ENCOUNTER — Other Ambulatory Visit: Payer: 59

## 2018-01-07 ENCOUNTER — Encounter: Payer: Commercial Managed Care - HMO | Admitting: Family Medicine

## 2018-01-22 ENCOUNTER — Encounter: Payer: Self-pay | Admitting: Family Medicine

## 2018-01-22 ENCOUNTER — Ambulatory Visit (INDEPENDENT_AMBULATORY_CARE_PROVIDER_SITE_OTHER): Payer: 59 | Admitting: Family Medicine

## 2018-01-22 DIAGNOSIS — Z0000 Encounter for general adult medical examination without abnormal findings: Secondary | ICD-10-CM | POA: Diagnosis not present

## 2018-01-22 DIAGNOSIS — E559 Vitamin D deficiency, unspecified: Secondary | ICD-10-CM | POA: Diagnosis not present

## 2018-01-22 DIAGNOSIS — E782 Mixed hyperlipidemia: Secondary | ICD-10-CM

## 2018-01-22 DIAGNOSIS — F418 Other specified anxiety disorders: Secondary | ICD-10-CM | POA: Diagnosis not present

## 2018-01-22 LAB — COMPREHENSIVE METABOLIC PANEL
ALBUMIN: 4.2 g/dL (ref 3.5–5.2)
ALK PHOS: 31 U/L — AB (ref 39–117)
ALT: 15 U/L (ref 0–35)
AST: 15 U/L (ref 0–37)
BUN: 10 mg/dL (ref 6–23)
CO2: 29 mEq/L (ref 19–32)
CREATININE: 0.71 mg/dL (ref 0.40–1.20)
Calcium: 9.3 mg/dL (ref 8.4–10.5)
Chloride: 105 mEq/L (ref 96–112)
GFR: 95.25 mL/min (ref >60.00)
Glucose, Bld: 90 mg/dL (ref 70–99)
Potassium: 4.6 mEq/L (ref 3.5–5.1)
Sodium: 142 mEq/L (ref 135–145)
TOTAL PROTEIN: 6.9 g/dL (ref 6.0–8.3)
Total Bilirubin: 1.3 mg/dL — ABNORMAL HIGH (ref 0.2–1.2)

## 2018-01-22 LAB — LIPID PANEL
CHOLESTEROL: 148 mg/dL (ref 0–200)
HDL: 55.3 mg/dL (ref >39.00)
LDL Cholesterol: 76 mg/dL (ref 0–99)
NonHDL: 92.76
Total CHOL/HDL Ratio: 3
Triglycerides: 86 mg/dL (ref 0.0–149.0)
VLDL: 17.2 mg/dL (ref 0.0–40.0)

## 2018-01-22 LAB — TSH: TSH: 1.51 u[IU]/mL (ref 0.35–4.50)

## 2018-01-22 LAB — CBC
HCT: 42.1 % (ref 36.0–46.0)
Hemoglobin: 14.6 g/dL (ref 12.0–15.0)
MCHC: 34.7 g/dL (ref 30.0–36.0)
MCV: 87.7 fl (ref 78.0–100.0)
Platelets: 235 10*3/uL (ref 150.0–400.0)
RBC: 4.81 Mil/uL (ref 3.87–5.11)
RDW: 12.4 % (ref 11.5–15.5)
WBC: 5.9 10*3/uL (ref 4.0–10.5)

## 2018-01-22 LAB — VITAMIN D 25 HYDROXY (VIT D DEFICIENCY, FRACTURES): VITD: 27.54 ng/mL — AB (ref 30.00–100.00)

## 2018-01-22 NOTE — Assessment & Plan Note (Addendum)
Not taking any vitamin D

## 2018-01-22 NOTE — Patient Instructions (Signed)
Preventive Care 18-39 Years, Female Preventive care refers to lifestyle choices and visits with your health care provider that can promote health and wellness. What does preventive care include?  A yearly physical exam. This is also called an annual well check.  Dental exams once or twice a year.  Routine eye exams. Ask your health care provider how often you should have your eyes checked.  Personal lifestyle choices, including: ? Daily care of your teeth and gums. ? Regular physical activity. ? Eating a healthy diet. ? Avoiding tobacco and drug use. ? Limiting alcohol use. ? Practicing safe sex. ? Taking vitamin and mineral supplements as recommended by your health care provider. What happens during an annual well check? The services and screenings done by your health care provider during your annual well check will depend on your age, overall health, lifestyle risk factors, and family history of disease. Counseling Your health care provider may ask you questions about your:  Alcohol use.  Tobacco use.  Drug use.  Emotional well-being.  Home and relationship well-being.  Sexual activity.  Eating habits.  Work and work Statistician.  Method of birth control.  Menstrual cycle.  Pregnancy history.  Screening You may have the following tests or measurements:  Height, weight, and BMI.  Diabetes screening. This is done by checking your blood sugar (glucose) after you have not eaten for a while (fasting).  Blood pressure.  Lipid and cholesterol levels. These may be checked every 5 years starting at age 66.  Skin check.  Hepatitis C blood test.  Hepatitis B blood test.  Sexually transmitted disease (STD) testing.  BRCA-related cancer screening. This may be done if you have a family history of breast, ovarian, tubal, or peritoneal cancers.  Pelvic exam and Pap test. This may be done every 3 years starting at age 40. Starting at age 59, this may be done every 5  years if you have a Pap test in combination with an HPV test.  Discuss your test results, treatment options, and if necessary, the need for more tests with your health care provider. Vaccines Your health care provider may recommend certain vaccines, such as:  Influenza vaccine. This is recommended every year.  Tetanus, diphtheria, and acellular pertussis (Tdap, Td) vaccine. You may need a Td booster every 10 years.  Varicella vaccine. You may need this if you have not been vaccinated.  HPV vaccine. If you are 69 or younger, you may need three doses over 6 months.  Measles, mumps, and rubella (MMR) vaccine. You may need at least one dose of MMR. You may also need a second dose.  Pneumococcal 13-valent conjugate (PCV13) vaccine. You may need this if you have certain conditions and were not previously vaccinated.  Pneumococcal polysaccharide (PPSV23) vaccine. You may need one or two doses if you smoke cigarettes or if you have certain conditions.  Meningococcal vaccine. One dose is recommended if you are age 27-21 years and a first-year college student living in a residence hall, or if you have one of several medical conditions. You may also need additional booster doses.  Hepatitis A vaccine. You may need this if you have certain conditions or if you travel or work in places where you may be exposed to hepatitis A.  Hepatitis B vaccine. You may need this if you have certain conditions or if you travel or work in places where you may be exposed to hepatitis B.  Haemophilus influenzae type b (Hib) vaccine. You may need this if  you have certain risk factors.  Talk to your health care provider about which screenings and vaccines you need and how often you need them. This information is not intended to replace advice given to you by your health care provider. Make sure you discuss any questions you have with your health care provider. Document Released: 11/07/2001 Document Revised: 05/31/2016  Document Reviewed: 07/13/2015 Elsevier Interactive Patient Education  [employer].

## 2018-01-22 NOTE — Progress Notes (Signed)
Subjective:  I acted as a Neurosurgeon for [employer] Fuller Song, RMA   Patient ID: Whitney Trujillo, female    DOB: 1976/05/23, 42 y.o.   MRN: [id number]  Chief Complaint  Patient presents with  . Annual Exam    HPI  Patient is in today for annual exam and she is doing well. She reports she has been taking 5 HTP and VBD oil and she is feeling that has helped her anxiety greatly. No recent flares or anxiety attacks. No recent febrile illness or hospitalizations. Denies CP/palp/SOB/HA/congestion/fevers/GI or GU c/o. Taking meds as prescribed. She is staying active and maintaining a heart healthy diet.   Patient Care Team: Bradd Canary, MD as PCP - General (Family Medicine) Ola Spurr., MD (Obstetrics and Gynecology)   Past Medical History:  Diagnosis Date  . Acute right lower quadrant pain 01/25/2016  . ADD (attention deficit disorder) 02/02/2015  . Allergy    seasonal allergies  . Anxiety   . Depression with anxiety 03/15/2016  . Endometriosis   . Gallstones   . Hyperlipidemia, mixed 02/02/2015  . Migraine 12/16/2015  . Preventative health care 12/16/2015  . Pulmonary emboli (HCC)    b/l PE after hysterectomy, 5 weeks post op, clotting problem ruled out at [facility]    Past Surgical History:  Procedure Laterality Date  . ABDOMINAL HYSTERECTOMY     ovaries removed, in 2 separate procedures  . CESAREAN SECTION  2007, 2009  . CHOLECYSTECTOMY    . CYSTOSCOPY  10/2014  . EYE SURGERY     lasik b/l  . LAPAROTOMY    . WISDOM TOOTH EXTRACTION      Family History  Problem Relation Age of Onset  . Cancer Father        Carcinoid Syndrome, colon  . Colon cancer Father   . Diabetes Mother        2, diet controlled  . ADD / ADHD Son   . Heart disease Maternal Grandmother   . Stroke Paternal Grandmother   . Dementia Paternal Grandmother   . COPD Paternal Grandfather        black lun, Forensic scientist, tobacco  . Ovarian cancer Maternal Aunt     Social History   Socioeconomic History   . Marital status: Married    Spouse name: Not on file  . Number of children: Not on file  . Years of education: Not on file  . Highest education level: Not on file  Occupational History  . Not on file  Social Needs  . Financial resource strain: Not on file  . Food insecurity:    Worry: Not on file    Inability: Not on file  . Transportation needs:    Medical: Not on file    Non-medical: Not on file  Tobacco Use  . Smoking status: Never Smoker  . Smokeless tobacco: Never Used  Substance and Sexual Activity  . Alcohol use: Yes    Comment: Socially  . Drug use: No  . 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  Lifestyle  . Physical activity:    Days per week: Not on file    Minutes per session: Not on file  . Stress: Not on file  Relationships  . Social connections:    Talks on phone: Not on file    Gets together: Not on file    Attends religious  service: Not on file    Active member of club or organization: Not on file    Attends meetings of clubs or organizations: Not on file    Relationship status: Not on file  . Intimate partner violence:    Fear of current or ex partner: Not on file    Emotionally abused: Not on file    Physically abused: Not on file    Forced sexual activity: Not on file  Other Topics Concern  . Not on file  Social History Narrative  . Not on file    No outpatient medications prior to visit.   No facility-administered medications prior to visit.     Allergies  Allergen Reactions  . Latex Itching    Review of Systems  Constitutional: Negative for chills, fever and malaise/fatigue.  HENT: Negative for congestion and hearing loss.   Eyes: Negative for discharge.  Respiratory: Negative for cough, sputum production and shortness of breath.   Cardiovascular: Negative for chest pain, palpitations and leg swelling.  Gastrointestinal: Negative  for abdominal pain, blood in stool, constipation, diarrhea, heartburn, nausea and vomiting.  Genitourinary: Negative for dysuria, frequency, hematuria and urgency.  Musculoskeletal: Negative for back pain, falls and myalgias.  Skin: Negative for rash.  Neurological: Negative for dizziness, sensory change, loss of consciousness, weakness and headaches.  Endo/Heme/Allergies: Negative for environmental allergies. Does not bruise/bleed easily.  Psychiatric/Behavioral: Negative for depression and suicidal ideas. The patient is not nervous/anxious and does not have insomnia.        Objective:    Physical Exam  Constitutional: She is oriented to person, place, and time. No distress.  HENT:  Head: Normocephalic and atraumatic.  Right Ear: External ear normal.  Left Ear: External ear normal.  Nose: Nose normal.  Mouth/Throat: Oropharynx is clear and moist. No oropharyngeal exudate.  Eyes: Pupils are equal, round, and reactive to light. Conjunctivae are normal. Right eye exhibits no discharge. Left eye exhibits no discharge. No scleral icterus.  Neck: Normal range of motion. Neck supple. No thyromegaly present.  Cardiovascular: Normal rate, regular rhythm, normal heart sounds and intact distal pulses.  No murmur heard. Pulmonary/Chest: Effort normal and breath sounds normal. No respiratory distress. She has no wheezes. She has no rales.  Abdominal: Soft. Bowel sounds are normal. She exhibits no distension and no mass. There is no tenderness.  Musculoskeletal: Normal range of motion. She exhibits no edema or tenderness.  Lymphadenopathy:    She has no cervical adenopathy.  Neurological: She is alert and oriented to person, place, and time. She has normal reflexes. She displays normal reflexes. No cranial nerve deficit. Coordination normal.  Skin: Skin is warm and dry. No rash noted. She is not diaphoretic.    BP 117/61 (BP Location: Left Arm, Patient Position: Sitting, Cuff Size: Normal)    Pulse 99   Temp 97.6 F (36.4 C) (Oral)   Resp 18   Ht  (1.499 m)   Wt 122 lb 9.6 oz (55.6 kg)   SpO2 100%   BMI 24.76 kg/m  Wt Readings from Last 3 Encounters:  01/22/18 122 lb 9.6 oz (55.6 kg)  11/01/17 123 lb 6.4 oz (56 kg)  10/19/17 122 lb 6 oz (55.5 kg)   BP Readings from Last 3 Encounters:  01/22/18 117/61  11/01/17 (!) 108/55  10/19/17 108/62     Immunization History  Administered Date(s) Administered  . Influenza,inj,Quad PF,6+ Mos 05/29/2017  . Influenza-Unspecified 06/30/2014    Health Maintenance  Topic Date Due  .  HIV Screening  07/07/1989  . INFLUENZA VACCINE  04/25/2018  . TETANUS/TDAP  09/24/2018    Lab Results  Component Value Date   WBC 5.9 01/22/2018   HGB 14.6 01/22/2018   HCT 42.1 01/22/2018   PLT 235.0 01/22/2018   GLUCOSE 90 01/22/2018   CHOL 148 01/22/2018   TRIG 86.0 01/22/2018   HDL 55.30 01/22/2018   LDLCALC 76 01/22/2018   ALT 15 01/22/2018   AST 15 01/22/2018   NA 142 01/22/2018   K 4.6 01/22/2018   CL 105 01/22/2018   CREATININE 0.71 01/22/2018   BUN 10 01/22/2018   CO2 29 01/22/2018   TSH 1.51 01/22/2018    Lab Results  Component Value Date   TSH 1.51 01/22/2018   Lab Results  Component Value Date   WBC 5.9 01/22/2018   HGB 14.6 01/22/2018   HCT 42.1 01/22/2018   MCV 87.7 01/22/2018   PLT 235.0 01/22/2018   Lab Results  Component Value Date   NA 142 01/22/2018   K 4.6 01/22/2018   CO2 29 01/22/2018   GLUCOSE 90 01/22/2018   BUN 10 01/22/2018   CREATININE 0.71 01/22/2018   BILITOT 1.3 (H) 01/22/2018   ALKPHOS 31 (L) 01/22/2018   AST 15 01/22/2018   ALT 15 01/22/2018   PROT 6.9 01/22/2018   ALBUMIN 4.2 01/22/2018   CALCIUM 9.3 01/22/2018   ANIONGAP 12 07/30/2014   GFR 95.25 01/22/2018   Lab Results  Component Value Date   CHOL 148 01/22/2018   Lab Results  Component Value Date   HDL 55.30 01/22/2018   Lab Results  Component Value Date   LDLCALC 76 01/22/2018   Lab Results  Component  Value Date   TRIG 86.0 01/22/2018   Lab Results  Component Value Date   CHOLHDL 3 01/22/2018   No results found for: HGBA1C       Assessment & Plan:   Problem List Items Addressed This Visit    Hyperlipidemia, mixed    Encouraged heart healthy diet, increase exercise, avoid trans fats, consider a krill oil cap daily      Relevant Orders   Lipid panel (Completed)   Vitamin D deficiency    Not taking any vitamin D       Relevant Orders   VITAMIN D 25 Hydroxy (Vit-D Deficiency, Fractures) (Completed)   Preventative health care    Patient encouraged to maintain heart healthy diet, regular exercise, adequate sleep. Consider daily probiotics. Take medications as prescribed.      Relevant Orders   CBC (Completed)   Comprehensive metabolic panel (Completed)   TSH (Completed)   Depression with anxiety    Is doing well on 5 HTP and CBD oil and denies any concerns.         Whitney Trujillo does not currently have medications on file.  No orders of the defined types were placed in this encounter.   CMA served as Neurosurgeon during this visit. History, Physical and Plan performed by medical provider. Documentation and orders reviewed and attested to.  Danise Edge, MD

## 2018-01-22 NOTE — Assessment & Plan Note (Signed)
Is doing well on 5 HTP and CBD oil and denies any concerns.

## 2018-01-22 NOTE — Assessment & Plan Note (Signed)
Encouraged heart healthy diet, increase exercise, avoid trans fats, consider a krill oil cap daily 

## 2018-01-22 NOTE — Assessment & Plan Note (Signed)
Patient encouraged to maintain heart healthy diet, regular exercise, adequate sleep. Consider daily probiotics. Take medications as prescribed 

## 2018-01-28 DIAGNOSIS — N898 Other specified noninflammatory disorders of vagina: Secondary | ICD-10-CM | POA: Diagnosis not present

## 2018-01-28 DIAGNOSIS — R6882 Decreased libido: Secondary | ICD-10-CM | POA: Diagnosis not present

## 2018-01-28 DIAGNOSIS — R232 Flushing: Secondary | ICD-10-CM | POA: Diagnosis not present

## 2018-02-04 DIAGNOSIS — R6882 Decreased libido: Secondary | ICD-10-CM | POA: Diagnosis not present

## 2018-02-04 DIAGNOSIS — N951 Menopausal and female climacteric states: Secondary | ICD-10-CM | POA: Diagnosis not present

## 2018-02-04 DIAGNOSIS — G479 Sleep disorder, unspecified: Secondary | ICD-10-CM | POA: Diagnosis not present

## 2018-03-01 ENCOUNTER — Encounter: Payer: Self-pay | Admitting: Family Medicine

## 2018-03-01 ENCOUNTER — Ambulatory Visit: Payer: 59 | Admitting: Family Medicine

## 2018-03-01 DIAGNOSIS — F418 Other specified anxiety disorders: Secondary | ICD-10-CM | POA: Diagnosis not present

## 2018-03-01 MED ORDER — BUSPIRONE HCL 5 MG PO TABS
5.0000 mg | ORAL_TABLET | Freq: Three times a day (TID) | ORAL | 2 refills | Status: DC
Start: 2018-03-01 — End: 2018-05-23

## 2018-03-01 MED ORDER — FLUOXETINE HCL 10 MG PO TABS: 10.0000 mg | ORAL_TABLET | Freq: Every day | ORAL | 3 refills | Status: DC

## 2018-03-01 NOTE — Patient Instructions (Addendum)
CoverMyMeds Good Rx Living With Anxiety After being diagnosed with an anxiety disorder, you may be relieved to know why you have felt or behaved a certain way. It is natural to also feel overwhelmed about the treatment ahead and what it will mean for your life. With care and support, you can manage this condition and recover from it. How to cope with anxiety Dealing with stress Stress is your body's reaction to life changes and events, both good and bad. Stress can last just a few hours or it can be ongoing. Stress can play a major role in anxiety, so it is important to learn both how to cope with stress and how to think about it differently. Talk with your health care provider or a counselor to learn more about stress reduction. He or she may suggest some stress reduction techniques, such as:  Music therapy. This can include creating or listening to music that you enjoy and that inspires you.  Mindfulness-based meditation. This involves being aware of your normal breaths, rather than trying to control your breathing. It can be done while sitting or walking.  Centering prayer. This is a kind of meditation that involves focusing on a word, phrase, or sacred image that is meaningful to you and that brings you peace.  Deep breathing. To do this, expand your stomach and inhale slowly through your nose. Hold your breath for 3-5 seconds. Then exhale slowly, allowing your stomach muscles to relax.  Self-talk. This is a skill where you identify thought patterns that lead to anxiety reactions and correct those thoughts.  Muscle relaxation. This involves tensing muscles then relaxing them.  Choose a stress reduction technique that fits your lifestyle and personality. Stress reduction techniques take time and practice. Set aside 5-15 minutes a day to do them. Therapists can offer training in these techniques. The training may be covered by some insurance plans. Other things you can do to manage stress  include:  Keeping a stress diary. This can help you learn what triggers your stress and ways to control your response.  Thinking about how you respond to certain situations. You may not be able to control everything, but you can control your reaction.  Making time for activities that help you relax, and not feeling guilty about spending your time in this way.  Therapy combined with coping and stress-reduction skills provides the best chance for successful treatment. Medicines Medicines can help ease symptoms. Medicines for anxiety include:  Anti-anxiety drugs.  Antidepressants.  Beta-blockers.  Medicines may be used as the main treatment for anxiety disorder, along with therapy, or if other treatments are not working. Medicines should be prescribed by a health care provider. Relationships Relationships can play a big part in helping you recover. Try to spend more time connecting with trusted friends and family members. Consider going to couples counseling, taking family education classes, or going to family therapy. Therapy can help you and others better understand the condition. How to recognize changes in your condition Everyone has a different response to treatment for anxiety. Recovery from anxiety happens when symptoms decrease and stop interfering with your daily activities at home or work. This may mean that you will start to:  Have better concentration and focus.  Sleep better.  Be less irritable.  Have more energy.  Have improved memory.  It is important to recognize when your condition is getting worse. Contact your health care provider if your symptoms interfere with home or work and you do not feel  like your condition is improving. Where to find help and support: You can get help and support from these sources:  Self-help groups.  Online and community organizations.  A trusted spiritual leader.  Couples counseling.  Family education classes.  Family  therapy.  Follow these instructions at home:  Eat a healthy diet that includes plenty of vegetables, fruits, whole grains, low-fat dairy products, and lean protein. Do not eat a lot of foods that are high in solid fats, added sugars, or salt.  Exercise. Most adults should do the following: ? Exercise for at least 150 minutes each week. The exercise should increase your heart rate and make you sweat (moderate-intensity exercise). ? Strengthening exercises at least twice a week.  Cut down on caffeine, tobacco, alcohol, and other potentially harmful substances.  Get the right amount and quality of sleep. Most adults need 7-9 hours of sleep each night.  Make choices that simplify your life.  Take over-the-counter and prescription medicines only as told by your health care provider.  Avoid caffeine, alcohol, and certain over-the-counter cold medicines. These may make you feel worse. Ask your pharmacist which medicines to avoid.  Keep all follow-up visits as told by your health care provider. This is important. Questions to ask your health care provider  Would I benefit from therapy?  How often should I follow up with a health care provider?  How long do I need to take medicine?  Are there any long-term side effects of my medicine?  Are there any alternatives to taking medicine? Contact a health care provider if:  You have a hard time staying focused or finishing daily tasks.  You spend many hours a day feeling worried about everyday life.  You become exhausted by worry.  You start to have headaches, feel tense, or have nausea.  You urinate more than normal.  You have diarrhea. Get help right away if:  You have a racing heart and shortness of breath.  You have thoughts of hurting yourself or others. If you ever feel like you may hurt yourself or others, or have thoughts about taking your own life, get help right away. You can go to your nearest emergency department or  call:  Your local emergency services (911 in the [place]  A suicide crisis helpline, such as the National Suicide Prevention Lifeline at 1-800-273-8255. This is open 24-hours a day.  Summary  Taking steps to deal with stress can help calm you.  Medicines cannot cure anxiety disorders, but they can help ease symptoms.  Family, friends, and partners can play a big part in helping you recover from an anxiety disorder. This information is not intended to replace advice given to you by your health care provider. Make sure you discuss any questions you have with your health care provider. Document Released: 09/05/2016 Document Revised: 09/05/2016 Document Reviewed: 09/05/2016 Elsevier Interactive Patient Education  2018 [employer]  

## 2018-03-02 NOTE — Assessment & Plan Note (Addendum)
Anxiety has worsened recently and she was trying to use CBD and 5 HTP but is not getting enough relief. We will try Buspar 5 mg tabs 1-2 tabs po tid and if inadequate response add 5 mg of Prozac she notes in past she was calmed by use of Fluoxetine 10 mg but did not like the side effects. Was counseled for 20 minutes of a 25 minute visit.

## 2018-03-02 NOTE — Progress Notes (Signed)
Subjective:    Patient ID: Frutoso ChaseMelissa A Carroll, female    DOB: 1976-01-31, 42 y.o.   MRN: [id number]  Chief Complaint  Patient presents with  . Anxiety    off and on, tried otc natural supplements-no improvment    HPI Patient is in today for reevaluation of anxiety which has worsened again recently. She was using CBD and 5 HTP but with increased stress with her kids and having to work more it is not working well enough. Notes anhedonia, palpitations, agitation and trouble concentrating at times. She felt calmer in past with Fluoxetine 10 but did not tolerate the side effects. Denies CP/palp/SOB/HA/congestion/fevers/GI or GU c/o. Taking meds as prescribed  Past Medical History:  Diagnosis Date  . Acute right lower quadrant pain 01/25/2016  . ADD (attention deficit disorder) 02/02/2015  . Allergy    seasonal allergies  . Anxiety   . Depression with anxiety 03/15/2016  . Endometriosis   . Gallstones   . Hyperlipidemia, mixed 02/02/2015  . Migraine 12/16/2015  . Preventative health care 12/16/2015  . Pulmonary emboli (HCC)    b/l PE after hysterectomy, 5 weeks post op, [facility]    Past Surgical History:  Procedure Laterality Date  . ABDOMINAL HYSTERECTOMY     ovaries removed, in 2 separate procedures  . CESAREAN SECTION  2007, 2009  . CHOLECYSTECTOMY    . CYSTOSCOPY  10/2014  . EYE SURGERY     lasik b/l  . LAPAROTOMY    . WISDOM TOOTH EXTRACTION      Family History  Problem Relation Age of Onset  . Cancer Father        Carcinoid Syndrome, colon  . Colon cancer Father   . Diabetes Mother        2, diet controlled  . ADD / ADHD Son   . Heart disease Maternal Grandmother   . Stroke Paternal Grandmother   . Dementia Paternal Grandmother   . COPD Paternal Grandfather        black lun, Forensic scientistcoal miner, tobacco  . Ovarian cancer Maternal Aunt     Social History   Socioeconomic History  . Marital status: Married    Spouse name: Not on file  . Number  of children: Not on file  . Years of education: Not on file  . Highest education level: Not on file  Occupational History  . Not on file  Social Needs  . Financial resource strain: Not on file  . Food insecurity:    Worry: Not on file    Inability: Not on file  . Transportation needs:    Medical: Not on file    Non-medical: Not on file  Tobacco Use  . Smoking status: Never Smoker  . Smokeless tobacco: Never Used  Substance and Sexual Activity  . Alcohol use: Yes    Comment: Socially  . Drug use: No  . 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  Lifestyle  . Physical activity:    Days per week: Not on file    Minutes per session: Not on file  . Stress: Not on file  Relationships  . Social connections:    Talks on phone: Not on file    Gets together: Not on file    Attends religious service: Not on file    Active member of club or organization: Not on file  Attends meetings of clubs or organizations: Not on file    Relationship status: Not on file  . Intimate partner violence:    Fear of current or ex partner: Not on file    Emotionally abused: Not on file    Physically abused: Not on file    Forced sexual activity: Not on file  Other Topics Concern  . Not on file  Social History Narrative  . Not on file    No outpatient medications prior to visit.   No facility-administered medications prior to visit.     Allergies  Allergen Reactions  . Latex Itching  . Gluten Meal Other (See Comments)    Cramping and diarrhea    Review of Systems  Constitutional: Negative for fever and malaise/fatigue.  HENT: Negative for congestion.   Eyes: Negative for blurred vision.  Respiratory: Negative for shortness of breath.   Cardiovascular: Positive for palpitations. Negative for chest pain and leg swelling.  Gastrointestinal: Negative for abdominal pain, blood in stool and  nausea.  Genitourinary: Negative for dysuria and frequency.  Musculoskeletal: Negative for falls.  Skin: Negative for rash.  Neurological: Negative for dizziness, loss of consciousness and headaches.  Endo/Heme/Allergies: Negative for environmental allergies.  Psychiatric/Behavioral: Negative for depression. The patient is nervous/anxious.        Objective:    Physical Exam  Constitutional: She is oriented to person, place, and time. No distress.  HENT:  Head: Normocephalic and atraumatic.  Eyes: Conjunctivae are normal.  Neck: Neck supple. No thyromegaly present.  Cardiovascular: Normal rate, regular rhythm and normal heart sounds.  No murmur heard. Pulmonary/Chest: Effort normal and breath sounds normal. She has no wheezes.  Abdominal: She exhibits no distension and no mass.  Musculoskeletal: She exhibits no edema.  Lymphadenopathy:    She has no cervical adenopathy.  Neurological: She is alert and oriented to person, place, and time.  Skin: Skin is warm and dry. No rash noted. She is not diaphoretic.  Psychiatric: Judgment normal.    BP 114/72 (BP Location: Right Arm, Patient Position: Sitting, Cuff Size: Normal)   Pulse (!) 49   Temp 97.6 F (36.4 C) (Oral)   Resp 16   Ht 4\' 11"  (1.499 m)   Wt 125 lb 9.6 oz (57 kg)   SpO2 99%   BMI 25.37 kg/m  Wt Readings from Last 3 Encounters:  03/01/18 125 lb 9.6 oz (57 kg)  01/22/18 122 lb 9.6 oz (55.6 kg)  11/01/17 123 lb 6.4 oz (56 kg)     Lab Results  Component Value Date   WBC 5.9 01/22/2018   HGB 14.6 01/22/2018   HCT 42.1 01/22/2018   PLT 235.0 01/22/2018   GLUCOSE 90 01/22/2018   CHOL 148 01/22/2018   TRIG 86.0 01/22/2018   HDL 55.30 01/22/2018   LDLCALC 76 01/22/2018   ALT 15 01/22/2018   AST 15 01/22/2018   NA 142 01/22/2018   K 4.6 01/22/2018   CL 105 01/22/2018   CREATININE 0.71 01/22/2018   BUN 10 01/22/2018   CO2 29 01/22/2018   TSH 1.51 01/22/2018    Lab Results  Component Value Date   TSH  1.51 01/22/2018   Lab Results  Component Value Date   WBC 5.9 01/22/2018   HGB 14.6 01/22/2018   HCT 42.1 01/22/2018   MCV 87.7 01/22/2018   PLT 235.0 01/22/2018   Lab Results  Component Value Date   NA 142 01/22/2018   K 4.6 01/22/2018   CO2 29  01/22/2018   GLUCOSE 90 01/22/2018   BUN 10 01/22/2018   CREATININE 0.71 01/22/2018   BILITOT 1.3 (H) 01/22/2018   ALKPHOS 31 (L) 01/22/2018   AST 15 01/22/2018   ALT 15 01/22/2018   PROT 6.9 01/22/2018   ALBUMIN 4.2 01/22/2018   CALCIUM 9.3 01/22/2018   ANIONGAP 12 07/30/2014   GFR 95.25 01/22/2018   Lab Results  Component Value Date   CHOL 148 01/22/2018   Lab Results  Component Value Date   HDL 55.30 01/22/2018   Lab Results  Component Value Date   LDLCALC 76 01/22/2018   Lab Results  Component Value Date   TRIG 86.0 01/22/2018   Lab Results  Component Value Date   CHOLHDL 3 01/22/2018   No results found for: HGBA1C     Assessment & Plan:   Problem List Items Addressed This Visit    Depression with anxiety    Anxiety has worsened recently and she was trying to use CBD and 5 HTP but is not getting enough relief. We will try Buspar 5 mg tabs 1-2 tabs po tid and if inadequate response add 5 mg of Prozac she notes in past she was calmed by use of Fluoxetine 10 mg but did not like the side effects. Was counseled for 20 minutes of a 25 minute visit.       Relevant Medications   busPIRone (BUSPAR) 5 MG tablet   FLUoxetine (PROZAC) 10 MG tablet      I am having Talajah A. Schmit start on busPIRone and FLUoxetine.  Meds ordered this encounter  Medications  . busPIRone (BUSPAR) 5 MG tablet    Sig: Take 1 tablet (5 mg total) by mouth 3 (three) times daily.    Dispense:  90 tablet    Refill:  2  . FLUoxetine (PROZAC) 10 MG tablet    Sig: Take 1 tablet (10 mg total) by mouth daily.    Dispense:  15 tablet    Refill:  3     Danise Edge, MD

## 2018-03-26 ENCOUNTER — Encounter: Payer: Self-pay | Admitting: Family Medicine

## 2018-03-28 ENCOUNTER — Other Ambulatory Visit: Payer: Self-pay | Admitting: Family Medicine

## 2018-03-28 MED ORDER — FLUOXETINE HCL 10 MG PO CAPS: 10.0000 mg | ORAL_CAPSULE | Freq: Every day | ORAL | 1 refills | Status: DC

## 2018-05-06 DIAGNOSIS — N951 Menopausal and female climacteric states: Secondary | ICD-10-CM | POA: Diagnosis not present

## 2018-05-08 DIAGNOSIS — R6882 Decreased libido: Secondary | ICD-10-CM | POA: Diagnosis not present

## 2018-05-08 DIAGNOSIS — R232 Flushing: Secondary | ICD-10-CM | POA: Diagnosis not present

## 2018-05-08 DIAGNOSIS — N951 Menopausal and female climacteric states: Secondary | ICD-10-CM | POA: Diagnosis not present

## 2018-05-23 ENCOUNTER — Encounter: Payer: Self-pay | Admitting: Family Medicine

## 2018-05-23 ENCOUNTER — Ambulatory Visit: Payer: 59 | Admitting: Family Medicine

## 2018-05-23 DIAGNOSIS — G43809 Other migraine, not intractable, without status migrainosus: Secondary | ICD-10-CM

## 2018-05-23 DIAGNOSIS — E559 Vitamin D deficiency, unspecified: Secondary | ICD-10-CM

## 2018-05-23 DIAGNOSIS — F418 Other specified anxiety disorders: Secondary | ICD-10-CM

## 2018-05-23 MED ORDER — FLUOXETINE HCL 10 MG PO CAPS: 10.0000 mg | ORAL_CAPSULE | Freq: Every day | ORAL | 1 refills | Status: DC

## 2018-05-23 NOTE — Assessment & Plan Note (Signed)
Encouraged increased hydration, 64 ounces of clear fluids daily. Minimize alcohol and caffeine. Eat small frequent meals with lean proteins and complex carbs. Avoid high and low blood sugars. Get adequate sleep, 7-8 hours a night. Needs exercise daily preferably in the morning. Doing well good response to meds when needed

## 2018-05-23 NOTE — Assessment & Plan Note (Signed)
Continue to supplement and monitor 

## 2018-05-23 NOTE — Progress Notes (Signed)
Subjective:    Patient ID: Whitney Trujillo, female    DOB: 05-06-1976, 42 y.o.   MRN: [id number]  No chief complaint on file.   HPI Patient is in today for follow up. She feels well today, no recent febrile illness or hospitalizations. She is feeling well on the fluoxetine and denies anhedonia. No new concerns or recent hospitalizations. Denies CP/palp/SOB/HA/congestion/fevers/GI or GU c/o. Taking meds as prescribed  Past Medical History:  Diagnosis Date  . Acute right lower quadrant pain 01/25/2016  . ADD (attention deficit disorder) 02/02/2015  . Allergy    seasonal allergies  . Anxiety   . Depression with anxiety 03/15/2016  . Endometriosis   . Gallstones   . Hyperlipidemia, mixed 02/02/2015  . Migraine 12/16/2015  . Preventative health care 12/16/2015  . Pulmonary emboli (HCC)    b/l PE after hysterectomy, 5 weeks post op, clotting problem ruled out at [facility]    Past Surgical History:  Procedure Laterality Date  . ABDOMINAL HYSTERECTOMY     ovaries removed, in 2 separate procedures  . CESAREAN SECTION  2007, 2009  . CHOLECYSTECTOMY    . CYSTOSCOPY  10/2014  . EYE SURGERY     lasik b/l  . LAPAROTOMY    . WISDOM TOOTH EXTRACTION      Family History  Problem Relation Age of Onset  . Cancer Father        Carcinoid Syndrome, colon  . Colon cancer Father   . Diabetes Mother        2, diet controlled  . ADD / ADHD Son   . Heart disease Maternal Grandmother   . Stroke Paternal Grandmother   . Dementia Paternal Grandmother   . COPD Paternal Grandfather        black lun, Forensic scientist, tobacco  . Ovarian cancer Maternal Aunt     Social History   Socioeconomic History  . Marital status: Married    Spouse name: Not on file  . Number of children: Not on file  . Years of education: Not on file  . Highest education level: Not on file  Occupational History  . Not on file  Social Needs  . Financial resource strain: Not on file  . Food insecurity:    Worry: Not on  file    Inability: Not on file  . Transportation needs:    Medical: Not on file    Non-medical: Not on file  Tobacco Use  . Smoking status: Never Smoker  . Smokeless tobacco: Never Used  Substance and Sexual Activity  . Alcohol use: Yes    Comment: Socially  . Drug use: No  . 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  Lifestyle  . Physical activity:    Days per week: Not on file    Minutes per session: Not on file  . Stress: Not on file  Relationships  . Social connections:    Talks on phone: Not on file    Gets together: Not on file    Attends religious service: Not on file    Active member of club or organization: Not on file    Attends meetings of clubs or organizations: Not on file    Relationship status: Not on file  . Intimate partner violence:    Fear of current or ex partner: Not on file    Emotionally abused: Not on file  Physically abused: Not on file    Forced sexual activity: Not on file  Other Topics Concern  . Not on file  Social History Narrative  . Not on file    Outpatient Medications Prior to Visit  Medication Sig Dispense Refill  . FLUoxetine (PROZAC) 10 MG capsule Take 1 capsule (10 mg total) by mouth daily. 30 capsule 1  . busPIRone (BUSPAR) 5 MG tablet Take 1 tablet (5 mg total) by mouth 3 (three) times daily. 90 tablet 2   No facility-administered medications prior to visit.     Allergies  Allergen Reactions  . Latex Itching  . Gluten Meal Other (See Comments)    Cramping and diarrhea    Review of Systems  Constitutional: Negative for fever and malaise/fatigue.  HENT: Negative for congestion.   Eyes: Negative for blurred vision.  Respiratory: Negative for shortness of breath.   Cardiovascular: Negative for chest pain, palpitations and leg swelling.  Gastrointestinal: Negative for abdominal pain, blood in stool and nausea.  Genitourinary:  Negative for dysuria and frequency.  Musculoskeletal: Negative for falls.  Skin: Negative for rash.  Neurological: Negative for dizziness, loss of consciousness and headaches.  Endo/Heme/Allergies: Negative for environmental allergies.  Psychiatric/Behavioral: Negative for depression. The patient is not nervous/anxious.        Objective:    Physical Exam  BP 90/64 (BP Location: Left Arm, Patient Position: Sitting, Cuff Size: Normal)   Pulse 67   Temp 98.1 F (36.7 C) (Oral)   Resp 18   Ht 4\' 11"  (1.499 m)   Wt 126 lb 6.4 oz (57.3 kg)   SpO2 98%   BMI 25.53 kg/m  Wt Readings from Last 3 Encounters:  05/23/18 126 lb 6.4 oz (57.3 kg)  03/01/18 125 lb 9.6 oz (57 kg)  01/22/18 122 lb 9.6 oz (55.6 kg)     Lab Results  Component Value Date   WBC 5.9 01/22/2018   HGB 14.6 01/22/2018   HCT 42.1 01/22/2018   PLT 235.0 01/22/2018   GLUCOSE 90 01/22/2018   CHOL 148 01/22/2018   TRIG 86.0 01/22/2018   HDL 55.30 01/22/2018   LDLCALC 76 01/22/2018   ALT 15 01/22/2018   AST 15 01/22/2018   NA 142 01/22/2018   K 4.6 01/22/2018   CL 105 01/22/2018   CREATININE 0.71 01/22/2018   BUN 10 01/22/2018   CO2 29 01/22/2018   TSH 1.51 01/22/2018    Lab Results  Component Value Date   TSH 1.51 01/22/2018   Lab Results  Component Value Date   WBC 5.9 01/22/2018   HGB 14.6 01/22/2018   HCT 42.1 01/22/2018   MCV 87.7 01/22/2018   PLT 235.0 01/22/2018   Lab Results  Component Value Date   NA 142 01/22/2018   K 4.6 01/22/2018   CO2 29 01/22/2018   GLUCOSE 90 01/22/2018   BUN 10 01/22/2018   CREATININE 0.71 01/22/2018   BILITOT 1.3 (H) 01/22/2018   ALKPHOS 31 (L) 01/22/2018   AST 15 01/22/2018   ALT 15 01/22/2018   PROT 6.9 01/22/2018   ALBUMIN 4.2 01/22/2018   CALCIUM 9.3 01/22/2018   ANIONGAP 12 07/30/2014   GFR 95.25 01/22/2018   Lab Results  Component Value Date   CHOL 148 01/22/2018   Lab Results  Component Value Date   HDL 55.30 01/22/2018   Lab Results    Component Value Date   LDLCALC 76 01/22/2018   Lab Results  Component Value Date   TRIG 86.0  01/22/2018   Lab Results  Component Value Date   CHOLHDL 3 01/22/2018   No results found for: HGBA1C     Assessment & Plan:   Problem List Items Addressed This Visit    Vitamin D deficiency    Continue to supplement and monitor      Migraine    Encouraged increased hydration, 64 ounces of clear fluids daily. Minimize alcohol and caffeine. Eat small frequent meals with lean proteins and complex carbs. Avoid high and low blood sugars. Get adequate sleep, 7-8 hours a night. Needs exercise daily preferably in the morning. Doing well good response to meds when needed      Relevant Medications   FLUoxetine (PROZAC) 10 MG capsule   Depression with anxiety    Good response to Fluoxetine, refill given today      Relevant Medications   FLUoxetine (PROZAC) 10 MG capsule      I have discontinued Vessie A. Grade's busPIRone. I am also having her maintain her FLUoxetine.  Meds ordered this encounter  Medications  . FLUoxetine (PROZAC) 10 MG capsule    Sig: Take 1 capsule (10 mg total) by mouth daily.    Dispense:  90 capsule    Refill:  1     Danise Edge, MD

## 2018-05-23 NOTE — Assessment & Plan Note (Signed)
Good response to Fluoxetine, refill given today

## 2018-05-23 NOTE — Patient Instructions (Signed)
Vitamin D Deficiency °Vitamin D deficiency is when your body does not have enough vitamin D. Vitamin D is important to your body for many reasons: °· It helps the body to absorb two important minerals, called calcium and phosphorus. °· It plays a role in bone health. °· It may help to prevent some diseases, such as diabetes and multiple sclerosis. °· It plays a role in muscle function, including heart function. ° °You can get vitamin D by: °· Eating foods that naturally contain vitamin D. °· Eating or drinking milk or other dairy products that have vitamin D added to them. °· Taking a vitamin D supplement or a multivitamin supplement that contains vitamin D. °· Being in the sun. Your body naturally makes vitamin D when your skin is exposed to sunlight. Your body changes the sunlight into a form of the vitamin that the body can use. ° °If vitamin D deficiency is severe, it can cause a condition in which your bones become soft. In adults, this condition is called osteomalacia. In children, this condition is called rickets. °What are the causes? °Vitamin D deficiency may be caused by: °· Not eating enough foods that contain vitamin D. °· Not getting enough sun exposure. °· Having certain digestive system diseases that make it difficult for your body to absorb vitamin D. These diseases include Crohn disease, chronic pancreatitis, and cystic fibrosis. °· Having a surgery in which a part of the stomach or a part of the small intestine is removed. °· Being obese. °· Having chronic kidney disease or liver disease. ° °What increases the risk? °This condition is more likely to develop in: °· Older people. °· People who do not spend much time outdoors. °· People who live in a long-term care facility. °· People who have had broken bones. °· People with weak or thin bones (osteoporosis). °· People who have a disease or condition that changes how the body absorbs vitamin D. °· People who have dark skin. °· People who take certain  medicines, such as steroid medicines or certain seizure medicines. °· People who are overweight or obese. ° °What are the signs or symptoms? °In mild cases of vitamin D deficiency, there may not be any symptoms. If the condition is severe, symptoms may include: °· Bone pain. °· Muscle pain. °· Falling often. °· Broken bones caused by a minor injury. ° °How is this diagnosed? °This condition is usually diagnosed with a blood test. °How is this treated? °Treatment for this condition may depend on what caused the condition. Treatment options include: °· Taking vitamin D supplements. °· Taking a calcium supplement. Your health care provider will suggest what dose is best for you. ° °Follow these instructions at home: °· Take medicines and supplements only as told by your health care provider. °· Eat foods that contain vitamin D. Choices include: °? Fortified dairy products, cereals, or juices. Fortified means that vitamin D has been added to the food. Check the label on the package to be sure. °? Fatty fish, such as salmon or trout. °? Eggs. °? Oysters. °· Do not use a tanning bed. °· Maintain a healthy weight. Lose weight, if needed. °· Keep all follow-up visits as told by your health care provider. This is important. °Contact a health care provider if: °· Your symptoms do not go away. °· You feel like throwing up (nausea) or you throw up (vomit). °· You have fewer bowel movements than usual or it is difficult for you to have a   bowel movement (constipation). °This information is not intended to replace advice given to you by your health care provider. Make sure you discuss any questions you have with your health care provider. °Document Released: 12/04/2011 Document Revised: 02/23/2016 Document Reviewed: 01/27/2015 °Elsevier Interactive Patient Education © 2018 [employer] ° °

## 2018-06-30 DIAGNOSIS — Z23 Encounter for immunization: Secondary | ICD-10-CM | POA: Diagnosis not present

## 2018-08-01 DIAGNOSIS — N951 Menopausal and female climacteric states: Secondary | ICD-10-CM | POA: Diagnosis not present

## 2018-08-07 DIAGNOSIS — N951 Menopausal and female climacteric states: Secondary | ICD-10-CM | POA: Diagnosis not present

## 2018-08-07 DIAGNOSIS — R232 Flushing: Secondary | ICD-10-CM | POA: Diagnosis not present

## 2018-08-07 DIAGNOSIS — R6882 Decreased libido: Secondary | ICD-10-CM | POA: Diagnosis not present

## 2018-08-17 DIAGNOSIS — N390 Urinary tract infection, site not specified: Secondary | ICD-10-CM | POA: Diagnosis not present

## 2018-08-17 DIAGNOSIS — R300 Dysuria: Secondary | ICD-10-CM | POA: Diagnosis not present

## 2018-10-04 ENCOUNTER — Ambulatory Visit: Payer: 59 | Admitting: Family Medicine

## 2018-10-04 ENCOUNTER — Encounter: Payer: Self-pay | Admitting: Family Medicine

## 2018-10-04 VITALS — BP 110/72 | HR 57 | Temp 97.8°F | Ht 59.0 in | Wt 133.5 lb

## 2018-10-04 DIAGNOSIS — R1900 Intra-abdominal and pelvic swelling, mass and lump, unspecified site: Secondary | ICD-10-CM | POA: Diagnosis not present

## 2018-10-04 DIAGNOSIS — Z23 Encounter for immunization: Secondary | ICD-10-CM

## 2018-10-04 NOTE — Progress Notes (Signed)
Pre visit review using our clinic review tool, if applicable. No additional management support is needed unless otherwise documented below in the visit note. 

## 2018-10-04 NOTE — Addendum Note (Signed)
Addended by: Scharlene Gloss B on: 10/04/2018 01:20 PM   Modules accepted: Orders

## 2018-10-04 NOTE — Progress Notes (Signed)
Chief Complaint  Patient presents with  . Hernia    Subjective: Patient is a 43 y.o. female here for bump.  Noticed initially 2-3 years ago. Would come and go, worse over Xmas. Now constant, sometimes hurts, no skin changes, vomiting, fevers, BM's have been constipated but no blood. She will sometimes experiencing gurgling over area. She does have hx of numerous surgeries on abd.   ROS: GI: As noted in HPI  Past Medical History:  Diagnosis Date  . Acute right lower quadrant pain 01/25/2016  . ADD (attention deficit disorder) 02/02/2015  . Allergy    seasonal allergies  . Anxiety   . Depression with anxiety 03/15/2016  . Endometriosis   . Gallstones   . Hyperlipidemia, mixed 02/02/2015  . Migraine 12/16/2015  . Preventative health care 12/16/2015  . Pulmonary emboli (HCC)    b/l PE after hysterectomy, 5 weeks post op, clotting problem ruled out at [facility]    Objective: BP 110/72 (BP Location: Left Arm, Patient Position: Sitting, Cuff Size: Normal)   Pulse (!) 57   Temp 97.8 F (36.6 C) (Oral)   Ht 4\' 11"  (1.499 m)   Wt 133 lb 8 oz (60.6 kg)   SpO2 98%   BMI 26.96 kg/m  General: Awake, appears stated age Lungs: No accessory muscle use GI: There is a 2 cm x 6 cm bulge on R side of abd, flesh colored, mild ttp, no fluctuance, mild increase in girth with valsalva. BS+.  Psych: Age appropriate judgment and insight, normal affect and mood  Assessment and Plan: Abdominal wall bulge - Plan: US Abdomen Limited  Orders as above. Clinically it resembles a ventral hernia. Will consider CT scan if US unremarkable vs referral to GI.  F/u prn. The patient voiced understanding and agreement to the plan.  Jilda Roche Frost, DO 10/04/18  1:15 PM

## 2018-10-04 NOTE — Patient Instructions (Addendum)
We will be in touch regarding your ultrasound results.   Things to look out for: Fevers, bleeding, darkening of skin over area, vomiting, increasing pain.    Let us know if you need anything.

## 2018-10-07 ENCOUNTER — Other Ambulatory Visit (HOSPITAL_BASED_OUTPATIENT_CLINIC_OR_DEPARTMENT_OTHER): Payer: Self-pay | Admitting: Family Medicine

## 2018-10-07 ENCOUNTER — Ambulatory Visit: Payer: 59 | Admitting: Family Medicine

## 2018-10-07 DIAGNOSIS — Z1231 Encounter for screening mammogram for malignant neoplasm of breast: Secondary | ICD-10-CM

## 2018-10-09 ENCOUNTER — Ambulatory Visit (HOSPITAL_BASED_OUTPATIENT_CLINIC_OR_DEPARTMENT_OTHER)
Admission: RE | Admit: 2018-10-09 | Discharge: 2018-10-09 | Disposition: A | Discharge status: Home / Self Care | Payer: 59 | Source: Ambulatory Visit | Attending: Family Medicine | Admitting: Family Medicine

## 2018-10-09 ENCOUNTER — Encounter: Payer: Self-pay | Admitting: Family Medicine

## 2018-10-09 ENCOUNTER — Encounter (HOSPITAL_BASED_OUTPATIENT_CLINIC_OR_DEPARTMENT_OTHER): Payer: Self-pay

## 2018-10-09 DIAGNOSIS — R1900 Intra-abdominal and pelvic swelling, mass and lump, unspecified site: Secondary | ICD-10-CM | POA: Insufficient documentation

## 2018-10-09 DIAGNOSIS — K4040 Unilateral inguinal hernia, with gangrene, not specified as recurrent: Secondary | ICD-10-CM | POA: Diagnosis not present

## 2018-10-09 DIAGNOSIS — K439 Ventral hernia without obstruction or gangrene: Secondary | ICD-10-CM

## 2018-10-22 DIAGNOSIS — K429 Umbilical hernia without obstruction or gangrene: Secondary | ICD-10-CM | POA: Diagnosis not present

## 2018-10-22 DIAGNOSIS — K436 Other and unspecified ventral hernia with obstruction, without gangrene: Secondary | ICD-10-CM | POA: Diagnosis not present

## 2018-10-23 ENCOUNTER — Ambulatory Visit: Payer: Self-pay | Admitting: Surgery

## 2018-10-23 NOTE — H&P (Signed)
History of Present Illness Whitney Trujillo. Jassen Sarver MD; 10/23/2018 3:23 PM) The patient is a 43 year old female who presents with an umbilical hernia. Referred by Dr. Carmelia Roller for epigastric hernia.  This is a healthy 43 year old female s/p multiple previous GYN procedures who presents with a 20 year history of a slowly enlarging bulge in the epigastrium. This has become quite large and is occasionally painful. She underwent an ultrasound that showed a hernia containing only fat and fluid. She also has a bulge at her umbilicus where she has had multiple previous surgeries.  CLINICAL DATA: Abdominal bulge in the mid upper abdomen  EXAM: ULTRASOUND ABDOMEN LIMITED  COMPARISON: CT 01/25/2016  FINDINGS: There appears to be a midline upper abdominal ventral wall hernia noted in the area of palpable bulge. The defect in the abdominal wall measures 11 x 7 mm. This appears to contain fat and no bowel. Small amount of fluid also noted in the hernia.  IMPRESSION: Midline ventral hernia containing fat and a small amount of fluid.   Electronically Signed By: Charlett Nose M.D. On: 10/09/2018 12:55    Past Surgical History Kristen Cardinal, CMA; 10/22/2018 3:31 PM) Cesarean Section - Multiple Gallbladder Surgery - Laparoscopic Hysterectomy (not due to cancer) - Complete Oral Surgery Tonsillectomy  Diagnostic Studies History Kristen Cardinal, CMA; 10/22/2018 3:31 PM) Colonoscopy 1-5 years ago Mammogram within last year Pap Smear 1-5 years ago  Allergies Kristen Cardinal, CMA; 10/22/2018 3:32 PM) No Known Allergies [10/22/2018]: No Known Drug Allergies [10/22/2018]: Allergies Reconciled  Medication History Kristen Cardinal, CMA; 10/22/2018 3:33 PM) FLUoxetine HCl (10MG  Capsule, Oral) Active. Probiata (Oral) Active. Medications Reconciled  Social History Kristen Cardinal, CMA; 10/22/2018 3:31 PM) Alcohol use Occasional alcohol use. Caffeine use Carbonated beverages, Coffee. No  drug use Tobacco use Never smoker.  Family History Kristen Cardinal, [place]; 10/22/2018 3:31 PM) Arthritis Mother. Colon Cancer Father. Depression Mother. Ischemic Bowel Disease Mother. Seizure disorder Mother.  Pregnancy / Birth History Kristen Cardinal, CMA; 10/22/2018 3:31 PM) Age at menarche 11 years. Age of menopause <43 Contraceptive History Oral contraceptives. Gravida 3 Irregular periods Maternal age 4-30 Para 2  Other Problems Kristen Cardinal, CMA; 10/22/2018 3:31 PM) Anxiety Disorder Cholelithiasis Oophorectomy Bilateral. Pulmonary Embolism / Blood Clot in Legs     Review of Systems (Sabrina Canty CMA; 10/22/2018 3:31 PM) General Present- Fatigue. Not Present- Appetite Loss, Chills, Fever, Night Sweats, Weight Gain and Weight Loss. Skin Not Present- Change in Wart/Mole, Dryness, Hives, Jaundice, New Lesions, Non-Healing Wounds, Rash and Ulcer. HEENT Not Present- Earache, Hearing Loss, Hoarseness, Nose Bleed, Oral Ulcers, Ringing in the Ears, Seasonal Allergies, Sinus Pain, Sore Throat, Visual Disturbances, Wears glasses/contact lenses and Yellow Eyes. Respiratory Not Present- Bloody sputum, Chronic Cough, Difficulty Breathing, Snoring and Wheezing. Breast Not Present- Breast Mass, Breast Pain, Nipple Discharge and Skin Changes. Cardiovascular Present- Shortness of Breath. Not Present- Chest Pain, Difficulty Breathing Lying Down, Leg Cramps, Palpitations, Rapid Heart Rate and Swelling of Extremities. Gastrointestinal Present- Bloating, Change in Bowel Habits, Constipation, Excessive gas, Gets full quickly at meals, Indigestion and Nausea. Not Present- Abdominal Pain, Bloody Stool, Chronic diarrhea, Difficulty Swallowing, Hemorrhoids, Rectal Pain and Vomiting. Female Genitourinary Not Present- Frequency, Nocturia, Painful Urination, Pelvic Pain and Urgency. Musculoskeletal Not Present- Back Pain, Joint Pain, Joint Stiffness, Muscle Pain, Muscle Weakness and  Swelling of Extremities. Neurological Not Present- Decreased Memory, Fainting, Headaches, Numbness, Seizures, Tingling, Tremor, Trouble walking and Weakness. Psychiatric Present- Anxiety. Not Present- Bipolar, Change in Sleep Pattern, Depression, Fearful and Frequent crying. Endocrine  Not Present- Cold Intolerance, Excessive Hunger, Hair Changes, Heat Intolerance, Hot flashes and New Diabetes. Hematology Not Present- Blood Thinners, Easy Bruising, Excessive bleeding, Gland problems, HIV and Persistent Infections.  Vitals (Sabrina Canty CMA; 10/22/2018 3:34 PM) 10/22/2018 3:33 PM Weight: 132.5 lb Height: 59in Body Surface Area: 1.55 m Body Mass Index: 26.76 kg/m  Temp.: 97.22F(Temporal)  Pulse: 84 (Regular)  P.OX: 98% (Room air) BP: 138/82 (Sitting, Left Arm, Standard)      Physical Exam [place] [place](Corinthian Mizrahi K. Jaydalyn Demattia MD; 10/23/2018 3:24 PM)  The physical exam findings are as follows: Note:WDWN in NAD Eyes: Pupils equal, round; sclera anicteric HENT: Oral mucosa moist; good dentition Neck: No masses palpated, no thyromegaly Lungs: CTA bilaterally; normal respiratory effort CV: Regular rate and rhythm; no murmurs; extremities well-perfused with no edema Abd: +bowel sounds, soft, non-tender, no palpable organomegaly; wide flat 3 x 4 cm palpable mass in epigastrium, mildly tender to palpation Umbilicus - multiple scars - small protruding, reducible hernia at upper edge of umbilicus Skin: Warm, dry; no sign of jaundice Psychiatric - alert and oriented x 4; calm mood and affect    Assessment & Plan [facility]li [place](Lluvia Gwynne K. Keyleen Cerrato MD; 10/23/2018 3:25 PM)  INCARCERATED EPIGASTRIC HERNIA (K43.6)   UMBILICAL HERNIA WITHOUT OBSTRUCTION OR GANGRENE (K42.9)  Current Plans Schedule for Surgery - Open repair of epigastric hernia and umbilical hernia with mesh. The surgical procedure has been discussed with the patient. Potential risks, benefits, alternative treatments, and expected outcomes have been  explained. All of the patient's questions at this time have been answered. The likelihood of reaching the patient's treatment goal is good. The patient understand the proposed surgical procedure and wishes to proceed.   Since the 2 defects are located so far apart and both so small, we will repair them separately through small incisions. It is likely that the epigastric hernia will not require mesh.  Whitney ArmsMatthew K. Corliss Skainssuei, MD, [facility]  General/ Trauma Surgery Beeper [phone]  10/23/2018 3:26 PM

## 2018-11-04 ENCOUNTER — Ambulatory Visit (HOSPITAL_BASED_OUTPATIENT_CLINIC_OR_DEPARTMENT_OTHER): Payer: 59

## 2018-11-05 DIAGNOSIS — K429 Umbilical hernia without obstruction or gangrene: Secondary | ICD-10-CM | POA: Diagnosis not present

## 2018-11-05 DIAGNOSIS — K439 Ventral hernia without obstruction or gangrene: Secondary | ICD-10-CM | POA: Diagnosis not present

## 2018-12-04 ENCOUNTER — Other Ambulatory Visit: Payer: Self-pay

## 2018-12-04 ENCOUNTER — Ambulatory Visit (HOSPITAL_BASED_OUTPATIENT_CLINIC_OR_DEPARTMENT_OTHER)
Admission: RE | Admit: 2018-12-04 | Discharge: 2018-12-04 | Disposition: A | Discharge status: Home / Self Care | Payer: 59 | Source: Ambulatory Visit | Attending: Family Medicine | Admitting: Family Medicine

## 2018-12-04 DIAGNOSIS — Z1231 Encounter for screening mammogram for malignant neoplasm of breast: Secondary | ICD-10-CM | POA: Insufficient documentation

## 2018-12-06 ENCOUNTER — Other Ambulatory Visit: Payer: Self-pay | Admitting: Family Medicine

## 2018-12-06 DIAGNOSIS — R928 Other abnormal and inconclusive findings on diagnostic imaging of breast: Secondary | ICD-10-CM

## 2018-12-11 ENCOUNTER — Ambulatory Visit
Admission: RE | Admit: 2018-12-11 | Discharge: 2018-12-11 | Disposition: A | Discharge status: Home / Self Care | Payer: 59 | Source: Ambulatory Visit | Attending: Family Medicine | Admitting: Family Medicine

## 2018-12-11 ENCOUNTER — Other Ambulatory Visit: Payer: Self-pay

## 2018-12-11 ENCOUNTER — Ambulatory Visit: Payer: 59

## 2018-12-11 DIAGNOSIS — R928 Other abnormal and inconclusive findings on diagnostic imaging of breast: Secondary | ICD-10-CM

## 2018-12-25 ENCOUNTER — Encounter: Payer: Self-pay | Admitting: Family Medicine

## 2018-12-31 ENCOUNTER — Other Ambulatory Visit: Payer: Self-pay

## 2018-12-31 ENCOUNTER — Ambulatory Visit (INDEPENDENT_AMBULATORY_CARE_PROVIDER_SITE_OTHER): Payer: 59 | Admitting: Family Medicine

## 2018-12-31 DIAGNOSIS — F418 Other specified anxiety disorders: Secondary | ICD-10-CM | POA: Diagnosis not present

## 2018-12-31 MED ORDER — FLUOXETINE HCL 20 MG PO TABS: 20.0000 mg | ORAL_TABLET | Freq: Every day | ORAL | 1 refills | Status: DC

## 2018-12-31 NOTE — Assessment & Plan Note (Signed)
Increase Fluoxetine from 10 to 20 mg and reassess with visit in 3 months unless she has any concerns and she will contact us sooner.

## 2018-12-31 NOTE — Progress Notes (Signed)
Virtual Visit via Video Note  I connected with Whitney Trujillo on 12/31/18 at 12:20 PM EDT by a video enabled telemedicine application and verified that I am speaking with the correct person using two identifiers.   I discussed the limitations of evaluation and management by telemedicine and the availability of in person appointments. The patient expressed understanding and agreed to proceed.    Subjective:    Patient ID: Whitney Trujillo, female    DOB: 25-Apr-1976, 43 y.o.   MRN: [id number]  No chief complaint on file.   HPI Patient is in today for evaluation of worsening anxiety and depression. 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 She is now homeschooling and notes increased irritability. Notes some anhedonia but denies any suicdal ideation or other acute concerns. Denies CP/palp/SOB/HA/congestion/fevers/GI or GU c/o. Taking meds as prescribed  Past Medical History:  Diagnosis Date  . Acute right lower quadrant pain 01/25/2016  . ADD (attention deficit disorder) 02/02/2015  . Allergy    seasonal allergies  . Anxiety   . Depression with anxiety 03/15/2016  . Endometriosis   . Gallstones   . Hyperlipidemia, mixed 02/02/2015  . Migraine 12/16/2015  . Preventative health care 12/16/2015  . Pulmonary emboli (HCC)    b/l PE after hysterectomy, 5 weeks post op, clotting problem ruled out [facility]    Past Surgical History:  Procedure Laterality Date  . ABDOMINAL HYSTERECTOMY     ovaries removed, in 2 separate procedures  . CESAREAN SECTION  2007, 2009  . CHOLECYSTECTOMY    . CYSTOSCOPY  10/2014  . EYE SURGERY     lasik b/l  . LAPAROTOMY     Endometriosis  . WISDOM TOOTH EXTRACTION      Family History  Problem Relation Age of Onset  . Cancer Father        Carcinoid Syndrome, colon  . Colon cancer Father   . Diabetes Mother        2, diet controlled  . ADD / ADHD Son   . Heart disease Maternal Grandmother   . Stroke Paternal  Grandmother   . Dementia Paternal Grandmother   . COPD Paternal Grandfather        black lun, Forensic scientist, tobacco  . Ovarian cancer Maternal Aunt     Social History   Socioeconomic History  . Marital status: Married    Spouse name: Not on file  . Number of children: Not on file  . Years of education: Not on file  . Highest education level: Not on file  Occupational History  . Not on file  Social Needs  . Financial resource strain: Not on file  . Food insecurity:    Worry: Not on file    Inability: Not on file  . Transportation needs:    Medical: Not on file    Non-medical: Not on file  Tobacco Use  . Smoking status: Never Smoker  . Smokeless tobacco: Never Used  Substance and Sexual Activity  . Alcohol use: Yes    Comment: Socially  . Drug use: No  . 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  Lifestyle  . Physical activity:    Days per week: Not on file    Minutes per session: Not on file  . Stress: Not on file  Relationships  . Social connections:  Talks on phone: Not on file    Gets together: Not on file    Attends religious service: Not on file    Active member of club or organization: Not on file    Attends meetings of clubs or organizations: Not on file    Relationship status: Not on file  . Intimate partner violence:    Fear of current or ex partner: Not on file    Emotionally abused: Not on file    Physically abused: Not on file    Forced sexual activity: Not on file  Other Topics Concern  . Not on file  Social History Narrative  . Not on file    Outpatient Medications Prior to Visit  Medication Sig Dispense Refill  . FLUoxetine (PROZAC) 10 MG capsule TAKE ONE CAPSULE BY MOUTH DAILY 30 capsule 0   No facility-administered medications prior to visit.     Allergies  Allergen Reactions  . Latex Itching  . Gluten Meal Other (See Comments)     Cramping and diarrhea    Review of Systems  Constitutional: Negative for fever and malaise/fatigue.  HENT: Negative for congestion.   Eyes: Negative for blurred vision.  Respiratory: Negative for shortness of breath.   Cardiovascular: Negative for chest pain, palpitations and leg swelling.  Gastrointestinal: Negative for abdominal pain, blood in stool and nausea.  Genitourinary: Negative for dysuria and frequency.  Musculoskeletal: Negative for falls.  Skin: Negative for rash.  Neurological: Negative for dizziness, loss of consciousness and headaches.  Endo/Heme/Allergies: Negative for environmental allergies.  Psychiatric/Behavioral: Positive for depression. Negative for substance abuse and suicidal ideas. The patient is nervous/anxious.        Objective:    Physical Exam Vitals signs reviewed.  Constitutional:      Appearance: Normal appearance. She is not ill-appearing.  HENT:     Head: Normocephalic and atraumatic.     Nose: Nose normal.  Pulmonary:     Effort: Pulmonary effort is normal.  Neurological:     Mental Status: She is alert and oriented to person, place, and time.  Psychiatric:        Mood and Affect: Mood normal.        Behavior: Behavior normal.        Thought Content: Thought content normal.     There were no vitals taken for this visit. Wt Readings from Last 3 Encounters:  10/04/18 133 lb 8 oz (60.6 kg)  05/23/18 126 lb 6.4 oz (57.3 kg)  03/01/18 125 lb 9.6 oz (57 kg)    Diabetic Foot Exam - Simple   No data filed     Lab Results  Component Value Date   WBC 5.9 01/22/2018   HGB 14.6 01/22/2018   HCT 42.1 01/22/2018   PLT 235.0 01/22/2018   GLUCOSE 90 01/22/2018   CHOL 148 01/22/2018   TRIG 86.0 01/22/2018   HDL 55.30 01/22/2018   LDLCALC 76 01/22/2018   ALT 15 01/22/2018   AST 15 01/22/2018   NA 142 01/22/2018   K 4.6 01/22/2018   CL 105 01/22/2018   CREATININE 0.71 01/22/2018   BUN 10 01/22/2018   CO2 29 01/22/2018   TSH 1.51  01/22/2018    Lab Results  Component Value Date   TSH 1.51 01/22/2018   Lab Results  Component Value Date   WBC 5.9 01/22/2018   HGB 14.6 01/22/2018   HCT 42.1 01/22/2018   MCV 87.7 01/22/2018   PLT 235.0 01/22/2018   Lab Results  Component Value Date   NA 142 01/22/2018   K 4.6 01/22/2018   CO2 29 01/22/2018   GLUCOSE 90 01/22/2018   BUN 10 01/22/2018   CREATININE 0.71 01/22/2018   BILITOT 1.3 (H) 01/22/2018   ALKPHOS 31 (L) 01/22/2018   AST 15 01/22/2018   ALT 15 01/22/2018   PROT 6.9 01/22/2018   ALBUMIN 4.2 01/22/2018   CALCIUM 9.3 01/22/2018   ANIONGAP 12 07/30/2014   GFR 95.25 01/22/2018   Lab Results  Component Value Date   CHOL 148 01/22/2018   Lab Results  Component Value Date   HDL 55.30 01/22/2018   Lab Results  Component Value Date   LDLCALC 76 01/22/2018   Lab Results  Component Value Date   TRIG 86.0 01/22/2018   Lab Results  Component Value Date   CHOLHDL 3 01/22/2018   No results found for: HGBA1C     Assessment & Plan:   Problem List Items Addressed This Visit    Depression with anxiety    Increase Fluoxetine from 10 to 20 mg and reassess with visit in 3 months unless she has any concerns and she will contact us sooner.       Relevant Medications   FLUoxetine (PROZAC) 20 MG tablet      I have discontinued Lanora A. Silfies "[name]"'s FLUoxetine. I am also having her start on FLUoxetine.  Meds ordered this encounter  Medications  . FLUoxetine (PROZAC) 20 MG tablet    Sig: Take 1 tablet (20 mg total) by mouth daily.    Dispense:  90 tablet    Refill:  1     Danise EdgeStacey Blyth, MD   I discussed the assessment and treatment plan with the patient. The patient was provided an opportunity to ask questions and all were answered. The patient agreed with the plan and demonstrated an understanding of the instructions.   The patient was advised to call back or seek an in-person evaluation if the symptoms worsen or if the condition  fails to improve as anticipated.  I provided 11 minutes of non-face-to-face time during this encounter.   Danise EdgeStacey Blyth, MD

## 2019-01-01 ENCOUNTER — Other Ambulatory Visit: Payer: Self-pay | Admitting: Family Medicine

## 2019-01-01 ENCOUNTER — Encounter: Payer: Self-pay | Admitting: Family Medicine

## 2019-01-01 MED ORDER — FLUOXETINE HCL 20 MG PO CAPS: 20.0000 mg | ORAL_CAPSULE | Freq: Every day | ORAL | 1 refills | Status: DC

## 2019-01-02 ENCOUNTER — Ambulatory Visit: Payer: 59 | Admitting: Family Medicine

## 2019-01-24 ENCOUNTER — Encounter: Payer: 59 | Admitting: Family Medicine

## 2019-05-12 ENCOUNTER — Telehealth: Payer: Self-pay | Admitting: Family Medicine

## 2019-05-12 NOTE — Telephone Encounter (Signed)
Patient CPE was rescheduled into December. Please continue to send refills for Prozac as the patient had to r/s CPE due to COVID and VV schedules.

## 2019-05-13 ENCOUNTER — Encounter: Payer: 59 | Admitting: Family Medicine

## 2019-05-15 MED ORDER — FLUOXETINE HCL 20 MG PO CAPS: 20.0000 mg | ORAL_CAPSULE | Freq: Every day | ORAL | 1 refills | Status: DC

## 2019-05-15 NOTE — Telephone Encounter (Signed)
No answer/ no vm  rx has been sent in.

## 2019-05-15 NOTE — Addendum Note (Signed)
Addended by: Kem Boroughs D on: 05/15/2019 10:52 AM   Modules accepted: Orders

## 2019-09-01 ENCOUNTER — Telehealth: Payer: Self-pay

## 2019-09-01 ENCOUNTER — Encounter: Payer: 59 | Admitting: Family Medicine

## 2019-09-01 NOTE — Telephone Encounter (Signed)
LM for pt to call back to RS in office CPE with Dr. Charlett Blake - please transfer to office

## 2019-09-01 NOTE — Telephone Encounter (Signed)
Copied from [place] [id number]. Topic: General - Other >> Sep 01, 2019  8:08 AM Carolyn Stare wrote: Pt does not want a virtual visit so she would like a call back to reschedule

## 2019-09-01 NOTE — Telephone Encounter (Signed)
Can you call her back when you get a chance she is a cpe that wants in office visit.   She was on the schedule this morning and did not pickup.

## 2019-09-09 ENCOUNTER — Telehealth: Payer: Self-pay

## 2019-09-09 NOTE — Telephone Encounter (Signed)
Please advise 

## 2019-09-09 NOTE — Telephone Encounter (Signed)
LM for pt regarding RS of physical with Dr. Charlett Blake. I have made multiple attempts in the past week or so. I notified pt I would schedule and send appt thru MyChart and for her to respond in Mychart if changes are needed.

## 2019-09-09 NOTE — Telephone Encounter (Signed)
Copied from [place] [id number]. Topic: General - Other >> Sep 08, 2019  5:17 PM Whitney Trujillo, [place] C wrote: Reason for CRM: pt called in to reschedule her CPE with PCP.   Please assist.

## 2019-11-03 ENCOUNTER — Other Ambulatory Visit: Payer: Self-pay

## 2019-11-04 ENCOUNTER — Ambulatory Visit (INDEPENDENT_AMBULATORY_CARE_PROVIDER_SITE_OTHER): Payer: 59 | Admitting: Family Medicine

## 2019-11-04 ENCOUNTER — Encounter: Payer: Self-pay | Admitting: Family Medicine

## 2019-11-04 ENCOUNTER — Other Ambulatory Visit: Payer: Self-pay

## 2019-11-04 DIAGNOSIS — E559 Vitamin D deficiency, unspecified: Secondary | ICD-10-CM

## 2019-11-04 DIAGNOSIS — F418 Other specified anxiety disorders: Secondary | ICD-10-CM

## 2019-11-04 DIAGNOSIS — E782 Mixed hyperlipidemia: Secondary | ICD-10-CM

## 2019-11-04 DIAGNOSIS — Z0000 Encounter for general adult medical examination without abnormal findings: Secondary | ICD-10-CM | POA: Diagnosis not present

## 2019-11-04 DIAGNOSIS — G43809 Other migraine, not intractable, without status migrainosus: Secondary | ICD-10-CM | POA: Diagnosis not present

## 2019-11-04 LAB — CBC
HCT: 42.8 % (ref 36.0–46.0)
Hemoglobin: 14.3 g/dL (ref 12.0–15.0)
MCHC: 33.4 g/dL (ref 30.0–36.0)
MCV: 87.1 fl (ref 78.0–100.0)
Platelets: 281 10*3/uL (ref 150.0–400.0)
RBC: 4.92 Mil/uL (ref 3.87–5.11)
RDW: 13.2 % (ref 11.5–15.5)
WBC: 6.1 10*3/uL (ref 4.0–10.5)

## 2019-11-04 LAB — COMPREHENSIVE METABOLIC PANEL
ALT: 23 U/L (ref 0–35)
AST: 20 U/L (ref 0–37)
Albumin: 4.5 g/dL (ref 3.5–5.2)
Alkaline Phosphatase: 48 U/L (ref 39–117)
BUN: 16 mg/dL (ref 6–23)
CO2: 31 mEq/L (ref 19–32)
Calcium: 9.4 mg/dL (ref 8.4–10.5)
Chloride: 100 mEq/L (ref 96–112)
Creatinine, Ser: 0.82 mg/dL (ref 0.40–1.20)
GFR: 75.28 mL/min (ref >60.00)
Glucose, Bld: 84 mg/dL (ref 70–99)
Potassium: 4.1 mEq/L (ref 3.5–5.1)
Sodium: 136 mEq/L (ref 135–145)
Total Bilirubin: 1.1 mg/dL (ref 0.2–1.2)
Total Protein: 6.9 g/dL (ref 6.0–8.3)

## 2019-11-04 LAB — LIPID PANEL
Cholesterol: 258 mg/dL — ABNORMAL HIGH (ref 0–200)
HDL: 49.7 mg/dL (ref >39.00)
LDL Cholesterol: 173 mg/dL — ABNORMAL HIGH (ref 0–99)
NonHDL: 208.6
Total CHOL/HDL Ratio: 5
Triglycerides: 179 mg/dL — ABNORMAL HIGH (ref 0.0–149.0)
VLDL: 35.8 mg/dL (ref 0.0–40.0)

## 2019-11-04 LAB — TSH: TSH: 1.56 u[IU]/mL (ref 0.35–4.50)

## 2019-11-04 LAB — VITAMIN D 25 HYDROXY (VIT D DEFICIENCY, FRACTURES): VITD: 28.19 ng/mL — ABNORMAL LOW (ref 30.00–100.00)

## 2019-11-04 MED ORDER — RIZATRIPTAN BENZOATE 10 MG PO TABS: 10.0000 mg | ORAL_TABLET | ORAL | 5 refills | Status: DC | PRN

## 2019-11-04 NOTE — Assessment & Plan Note (Addendum)
Patient encouraged to maintain heart healthy diet, regular exercise, adequate sleep. Consider daily probiotics. Take medications as prescribed. Labs ordered and reviewed. She had a colonoscopy in 2014 at [facility] and denies any GI complaints, no colonoscopy due at this time. MGM is scheduled for next month and she has had a TAH SBO so no pap is warranted.

## 2019-11-04 NOTE — Assessment & Plan Note (Signed)
Has noted increased frequency of Migraines over past few months. Has used Maxalt in past with good results refills given. Encouraged increased hydration, 64 ounces of clear fluids daily. Minimize alcohol and caffeine. Eat small frequent meals with lean proteins and complex carbs. Avoid high and low blood sugars. Get adequate sleep, 7-8 hours a night. Needs exercise daily preferably in the morning.

## 2019-11-04 NOTE — Progress Notes (Signed)
Subjective:    Patient ID: Whitney Trujillo, female    DOB: 1976/08/13, 44 y.o.   MRN: [id number]  Chief Complaint  Patient presents with  . Annual Exam  . Migraine    needs medication for them    HPI Patient is in today for annual preventative exam. She is doing well. 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 Despite the increased stress she is doing well and the Fluoxetine has been helpful. No recent febrile illness or hospitalizations. She is noting an uptick in the number of migraines she is getting, noting roughly 2 a month. She does get pain and tension in her neck and shoulders working as a Theme park manager but works with a Geophysicist/field seismologist and is considering chiropractic also. .Denies CP/palp/SOB/congestion/fevers/GI or GU c/o. Taking meds as prescribed  Past Medical History:  Diagnosis Date  . Acute right lower quadrant pain 01/25/2016  . ADD (attention deficit disorder) 02/02/2015  . Allergy    seasonal allergies  . Anxiety   . Depression with anxiety 03/15/2016  . Endometriosis   . Gallstones   . Hyperlipidemia, mixed 02/02/2015  . Migraine 12/16/2015  . Preventative health care 12/16/2015  . Pulmonary emboli (HCC)    b/l PE after hysterectomy, 5 weeks post op, clotting problem ruled out at [facility]    Past Surgical History:  Procedure Laterality Date  . ABDOMINAL HYSTERECTOMY     ovaries removed, in 2 separate procedures  . CESAREAN SECTION  2007, 2009  . CHOLECYSTECTOMY    . CYSTOSCOPY  10/2014  . EYE SURGERY     lasik b/l  . LAPAROTOMY     Endometriosis  . WISDOM TOOTH EXTRACTION      Family History  Problem Relation Age of Onset  . Cancer Father        Carcinoid Syndrome, colon  . Colon cancer Father   . Diabetes Mother        2, diet controlled  . ADD / ADHD Son   . Heart disease Maternal Grandmother   . Stroke Paternal Grandmother   . Dementia Paternal Grandmother   . COPD Paternal Grandfather    black lun, Ecologist, tobacco  . Ovarian cancer Maternal Aunt     Social History   Socioeconomic History  . Marital status: Married    Spouse name: Not on file  . Number of children: Not on file  . Years of education: Not on file  . Highest education level: Not on file  Occupational History  . Not on file  Tobacco Use  . Smoking status: Never Smoker  . Smokeless tobacco: Never Used  Substance and Sexual Activity  . Alcohol use: Yes    Comment: Socially  . Drug use: No  . 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  Other Topics Concern  . Not on file  Social History Narrative  . Not on file   Social Determinants of Health   Financial Resource Strain:   . Difficulty of Paying Living Expenses: Not on file  Food Insecurity:   . Worried About Charity fundraiser in the Last Year: Not on file  . Ran Out of Food in the Last Year: Not on file  Transportation Needs:   . Lack of Transportation (Medical): Not on file  . Lack of Transportation (Non-Medical):  Not on file  Physical Activity:   . Days of Exercise per Week: Not on file  . Minutes of Exercise per Session: Not on file  Stress:   . Feeling of Stress : Not on file  Social Connections:   . Frequency of Communication with Friends and Family: Not on file  . Frequency of Social Gatherings with Friends and Family: Not on file  . Attends Religious Services: Not on file  . Active Member of Clubs or Organizations: Not on file  . Attends Banker Meetings: Not on file  . Marital Status: Not on file  Intimate Partner Violence:   . Fear of Current or Ex-Partner: Not on file  . Emotionally Abused: Not on file  . Physically Abused: Not on file  . Sexually Abused: Not on file    Outpatient Medications Prior to Visit  Medication Sig Dispense Refill  . FLUoxetine (PROZAC) 20 MG capsule Take 1 capsule (20 mg total) by  mouth daily. 90 capsule 1   No facility-administered medications prior to visit.    Allergies  Allergen Reactions  . Latex Itching  . Gluten Meal Other (See Comments)    Cramping and diarrhea    Review of Systems  Constitutional: Negative for chills, fever and malaise/fatigue.  HENT: Negative for congestion and hearing loss.   Eyes: Negative for discharge.  Respiratory: Negative for cough, sputum production and shortness of breath.   Cardiovascular: Negative for chest pain, palpitations and leg swelling.  Gastrointestinal: Negative for abdominal pain, blood in stool, constipation, diarrhea, heartburn, nausea and vomiting.  Genitourinary: Negative for dysuria, frequency, hematuria and urgency.  Musculoskeletal: Negative for back pain, falls and myalgias.  Skin: Negative for rash.  Neurological: Positive for headaches. Negative for dizziness, sensory change, loss of consciousness and weakness.  Endo/Heme/Allergies: Negative for environmental allergies. Does not bruise/bleed easily.  Psychiatric/Behavioral: Negative for depression and suicidal ideas. The patient is not nervous/anxious and does not have insomnia.        Objective:    Physical Exam Constitutional:      General: She is not in acute distress.    Appearance: She is well-developed.  HENT:     Head: Normocephalic and atraumatic.  Eyes:     Conjunctiva/sclera: Conjunctivae normal.  Neck:     Thyroid: No thyromegaly.  Cardiovascular:     Rate and Rhythm: Normal rate and regular rhythm.     Heart sounds: Normal heart sounds. No murmur.  Pulmonary:     Effort: Pulmonary effort is normal. No respiratory distress.     Breath sounds: Normal breath sounds.  Abdominal:     General: Bowel sounds are normal. There is no distension.     Palpations: Abdomen is soft. There is no mass.     Tenderness: There is no abdominal tenderness.  Musculoskeletal:     Cervical back: Neck supple.  Lymphadenopathy:     Cervical: No  cervical adenopathy.  Skin:    General: Skin is warm and dry.  Neurological:     Mental Status: She is alert and oriented to person, place, and time.  Psychiatric:        Behavior: Behavior normal.     BP 108/68 (BP Location: Left Arm, Patient Position: Sitting, Cuff Size: Normal)   Pulse 66   Temp (!) 97.4 F (36.3 C) (Temporal)   Ht 4\' 11"  (1.499 m)   Wt 142 lb (64.4 kg)   SpO2 99%   BMI 28.68 kg/m  Wt Readings from  Last 3 Encounters:  11/04/19 142 lb (64.4 kg)  10/04/18 133 lb 8 oz (60.6 kg)  05/23/18 126 lb 6.4 oz (57.3 kg)    Diabetic Foot Exam - Simple   No data filed     Lab Results  Component Value Date   WBC 6.1 11/04/2019   HGB 14.3 11/04/2019   HCT 42.8 11/04/2019   PLT 281.0 11/04/2019   GLUCOSE 84 11/04/2019   CHOL 258 (H) 11/04/2019   TRIG 179.0 (H) 11/04/2019   HDL 49.70 11/04/2019   LDLCALC 173 (H) 11/04/2019   ALT 23 11/04/2019   AST 20 11/04/2019   NA 136 11/04/2019   K 4.1 11/04/2019   CL 100 11/04/2019   CREATININE 0.82 11/04/2019   BUN 16 11/04/2019   CO2 31 11/04/2019   TSH 1.56 11/04/2019    Lab Results  Component Value Date   TSH 1.56 11/04/2019   Lab Results  Component Value Date   WBC 6.1 11/04/2019   HGB 14.3 11/04/2019   HCT 42.8 11/04/2019   MCV 87.1 11/04/2019   PLT 281.0 11/04/2019   Lab Results  Component Value Date   NA 136 11/04/2019   K 4.1 11/04/2019   CO2 31 11/04/2019   GLUCOSE 84 11/04/2019   BUN 16 11/04/2019   CREATININE 0.82 11/04/2019   BILITOT 1.1 11/04/2019   ALKPHOS 48 11/04/2019   AST 20 11/04/2019   ALT 23 11/04/2019   PROT 6.9 11/04/2019   ALBUMIN 4.5 11/04/2019   CALCIUM 9.4 11/04/2019   ANIONGAP 12 07/30/2014   GFR 75.28 11/04/2019   Lab Results  Component Value Date   CHOL 258 (H) 11/04/2019   Lab Results  Component Value Date   HDL 49.70 11/04/2019   Lab Results  Component Value Date   LDLCALC 173 (H) 11/04/2019   Lab Results  Component Value Date   TRIG 179.0 (H)  11/04/2019   Lab Results  Component Value Date   CHOLHDL 5 11/04/2019   No results found for: HGBA1C     Assessment & Plan:   Problem List Items Addressed This Visit    Hyperlipidemia, mixed    Encouraged heart healthy diet, increase exercise, avoid trans fats, consider a krill oil cap daily      Relevant Medications   rizatriptan (MAXALT) 10 MG tablet   Other Relevant Orders   Lipid panel (Completed)   Vitamin D deficiency    Supplement and monitor       Relevant Medications   rizatriptan (MAXALT) 10 MG tablet   Other Relevant Orders   VITAMIN D 25 Hydroxy (Vit-D Deficiency, Fractures) (Completed)   Preventative health care    Patient encouraged to maintain heart healthy diet, regular exercise, adequate sleep. Consider daily probiotics. Take medications as prescribed. Labs ordered and reviewed. She had a colonoscopy in 2014 at [facility] and denies any GI complaints, no colonoscopy due at this time. MGM is scheduled for next month and she has had a TAH SBO so no pap is warranted.       Relevant Medications   rizatriptan (MAXALT) 10 MG tablet   Other Relevant Orders   CBC (Completed)   Comprehensive metabolic panel (Completed)   TSH (Completed)   Migraine    Has noted increased frequency of Migraines over past few months. Has used Maxalt in past with good results refills given. Encouraged increased hydration, 64 ounces of clear fluids daily. Minimize alcohol and caffeine. Eat small frequent meals with lean proteins and complex carbs.  Avoid high and low blood sugars. Get adequate sleep, 7-8 hours a night. Needs exercise daily preferably in the morning.      Relevant Medications   rizatriptan (MAXALT) 10 MG tablet   Depression with anxiety    Doing well on Fluoxetine         I have changed Jacqualynn A. Nestler "[name]"'s rizatriptan. I am also having her maintain her FLUoxetine.  Meds ordered this encounter  Medications  . rizatriptan (MAXALT) 10 MG tablet    Sig:  Take 1 tablet (10 mg total) by mouth as needed for migraine. May repeat in 2 hours if needed Failed Imitrex with side effects    Dispense:  10 tablet    Refill:  5     Danise Edge, MD

## 2019-11-04 NOTE — Assessment & Plan Note (Signed)
Encouraged heart healthy diet, increase exercise, avoid trans fats, consider a krill oil cap daily 

## 2019-11-04 NOTE — Assessment & Plan Note (Signed)
Doing well on Fluoxetine 

## 2019-11-04 NOTE — Patient Instructions (Signed)
Omron Blood Pressure cuff, upper arm, want BP 100-140/60-90 Pulse oximeter, want oxygen in 90s  Weekly vitals  Take Multivitamin with minerals, selenium Vitamin D 1000-2000 IU daily Probiotic with lactobacillus and bifidophilus Asprin EC 81 mg daily  Melatonin 2-5 mg at bedtime  [email or web address]   Preventive Care 51-46 Years Old, Female Preventive care refers to visits with your health care provider and lifestyle choices that can promote health and wellness. This includes:  A yearly physical exam. This may also be called an annual well check.  Regular dental visits and eye exams.  Immunizations.  Screening for certain conditions.  Healthy lifestyle choices, such as eating a healthy diet, getting regular exercise, not using drugs or products that contain nicotine and tobacco, and limiting alcohol use. What can I expect for my preventive care visit? Physical exam Your health care provider will check your:  Height and weight. This may be used to calculate body mass index (BMI), which tells if you are at a healthy weight.  Heart rate and blood pressure.  Skin for abnormal spots. Counseling Your health care provider may ask you questions about your:  Alcohol, tobacco, and drug use.  Emotional well-being.  Home and relationship well-being.  Sexual activity.  Eating habits.  Work and work Statistician.  Method of birth control.  Menstrual cycle.  Pregnancy history. What immunizations do I need?  Influenza (flu) vaccine  This is recommended every year. Tetanus, diphtheria, and pertussis (Tdap) vaccine  You may need a Td booster every 10 years. Varicella (chickenpox) vaccine  You may need this if you have not been vaccinated. Human papillomavirus (HPV) vaccine  If recommended by your health care provider, you may need three doses over 6 months. Measles, mumps, and rubella (MMR) vaccine  You may need at least one dose  of MMR. You may also need a second dose. Meningococcal conjugate (MenACWY) vaccine  One dose is recommended if you are age 74-21 years and a first-year college student living in a residence hall, or if you have one of several medical conditions. You may also need additional booster doses. Pneumococcal conjugate (PCV13) vaccine  You may need this if you have certain conditions and were not previously vaccinated. Pneumococcal polysaccharide (PPSV23) vaccine  You may need one or two doses if you smoke cigarettes or if you have certain conditions. Hepatitis A vaccine  You may need this if you have certain conditions or if you travel or work in places where you may be exposed to hepatitis A. Hepatitis B vaccine  You may need this if you have certain conditions or if you travel or work in places where you may be exposed to hepatitis B. Haemophilus influenzae type b (Hib) vaccine  You may need this if you have certain conditions. You may receive vaccines as individual doses or as more than one vaccine together in one shot (combination vaccines). Talk with your health care provider about the risks and benefits of combination vaccines. What tests do I need?  Blood tests  Lipid and cholesterol levels. These may be checked every 5 years starting at age 69.  Hepatitis C test.  Hepatitis B test. Screening  Diabetes screening. This is done by checking your blood sugar (glucose) after you have not eaten for a while (fasting).  Sexually transmitted disease (STD) testing.  BRCA-related cancer screening. This may be done if you have a family history of breast, ovarian, tubal, or peritoneal cancers.  Pelvic exam and Pap test. This may be done  every 3 years starting at age 44. Starting at age 80, this may be done every 5 years if you have a Pap test in combination with an HPV test. Talk with your health care provider about your test results, treatment options, and if necessary, the need for more  tests. Follow these instructions at home: Eating and drinking   Eat a diet that includes fresh fruits and vegetables, whole grains, lean protein, and low-fat dairy.  Take vitamin and mineral supplements as recommended by your health care provider.  Do not drink alcohol if: ? Your health care provider tells you not to drink. ? You are pregnant, may be pregnant, or are planning to become pregnant.  If you drink alcohol: ? Limit how much you have to 0-1 drink a day. ? Be aware of how much alcohol is in your drink. In the [place] one drink equals one 12 oz bottle of beer (355 mL), one 5 oz glass of wine (148 mL), or one 1 oz glass of hard liquor (44 mL). Lifestyle  Take daily care of your teeth and gums.  Stay active. Exercise for at least 30 minutes on 5 or more days each week.  Do not use any products that contain nicotine or tobacco, such as cigarettes, e-cigarettes, and chewing tobacco. If you need help quitting, ask your health care provider.  If you are sexually active, practice safe sex. Use a condom or other form of birth control (contraception) in order to prevent pregnancy and STIs (sexually transmitted infections). If you plan to become pregnant, see your health care provider for a preconception visit. What's next?  Visit your health care provider once a year for a well check visit.  Ask your health care provider how often you should have your eyes and teeth checked.  Stay up to date on all vaccines. This information is not intended to replace advice given to you by your health care provider. Make sure you discuss any questions you have with your health care provider. Document Revised: 05/23/2018 Document Reviewed: 05/23/2018 Elsevier Patient Education  2020 [employer].

## 2019-11-04 NOTE — Assessment & Plan Note (Signed)
Supplement and monitor 

## 2019-12-14 ENCOUNTER — Other Ambulatory Visit: Payer: Self-pay | Admitting: Family Medicine

## 2019-12-15 ENCOUNTER — Other Ambulatory Visit (HOSPITAL_BASED_OUTPATIENT_CLINIC_OR_DEPARTMENT_OTHER): Payer: Self-pay | Admitting: Family Medicine

## 2019-12-15 ENCOUNTER — Ambulatory Visit (HOSPITAL_BASED_OUTPATIENT_CLINIC_OR_DEPARTMENT_OTHER)
Admission: RE | Admit: 2019-12-15 | Discharge: 2019-12-15 | Disposition: A | Discharge status: Home / Self Care | Payer: 59 | Source: Ambulatory Visit | Attending: Family Medicine | Admitting: Family Medicine

## 2019-12-15 ENCOUNTER — Other Ambulatory Visit: Payer: Self-pay

## 2019-12-15 DIAGNOSIS — Z1231 Encounter for screening mammogram for malignant neoplasm of breast: Secondary | ICD-10-CM

## 2020-01-30 ENCOUNTER — Encounter: Payer: Self-pay | Admitting: Family Medicine

## 2020-02-05 NOTE — Telephone Encounter (Signed)
Spoke with Annabelle Harman at [facility] Annabelle Harman to contact patient to schedule appointment.

## 2020-02-06 NOTE — Telephone Encounter (Signed)
LM requesting call back to discuss symptoms and determine need for appointment.

## 2020-02-09 ENCOUNTER — Ambulatory Visit: Payer: 59 | Admitting: Nurse Practitioner

## 2020-02-09 ENCOUNTER — Other Ambulatory Visit: Payer: Self-pay

## 2020-02-09 VITALS — BP 112/74 | HR 80 | Temp 97.3°F | Ht 59.0 in | Wt 138.5 lb

## 2020-02-09 DIAGNOSIS — U071 COVID-19: Secondary | ICD-10-CM | POA: Insufficient documentation

## 2020-02-09 MED ORDER — BENZONATATE 200 MG PO CAPS: 200.0000 mg | ORAL_CAPSULE | Freq: Two times a day (BID) | ORAL | 0 refills | Status: DC | PRN

## 2020-02-09 MED ORDER — AZITHROMYCIN 250 MG PO TABS: ORAL_TABLET | ORAL | 0 refills | Status: DC

## 2020-02-09 MED ORDER — PREDNISONE 20 MG PO TABS: 20.0000 mg | ORAL_TABLET | Freq: Every day | ORAL | 0 refills | Status: AC

## 2020-02-09 NOTE — Progress Notes (Signed)
@Patient  ID: , female    DOB: [birth year], 44 y.o.   MRN: [id number]  Chief Complaint  Patient presents with  . COVID Positive    Tested positive 5/6 at [facility], Still having cough, body aches and fevers which she had last night,     Referring provider: [id number], MD   44 year old female with history of PE, anxiety, and depression. Diagnosed with Covid on May 6th 2021.   HPI  Patient presents today for post covid care clinic visit. Patient states that her symptoms started on May 5th. She states that she is still having a fever of 100 degrees F at times, cough, chest congestion, and body aches. She states that her cough is productive of yellow sputum. She does not feel that she is improving and is concerned that she may be developing pneumonia. Denies n/v/d, hemoptysis, PND, leg swelling, chest pain or edema.       Allergies  Allergen Reactions  . Latex Itching  . Gluten Meal Other (See Comments)    Cramping and diarrhea    Immunization History  Administered Date(s) Administered  . Influenza,inj,Quad PF,6+ Mos 05/29/2017  . Influenza-Unspecified 06/30/2014, 06/25/2018  . Tdap 10/04/2018    Past Medical History:  Diagnosis Date  . Acute right lower quadrant pain 01/25/2016  . ADD (attention deficit disorder) 02/02/2015  . Allergy    seasonal allergies  . Anxiety   . Depression with anxiety 03/15/2016  . Endometriosis   . Gallstones   . Hyperlipidemia, mixed 02/02/2015  . Migraine 12/16/2015  . Preventative health care 12/16/2015  . Pulmonary emboli (HCC)    b/l PE after hysterectomy, 5 weeks post op, clotting problem ruled out at [facility]    Tobacco History: Social History   Tobacco Use  Smoking Status Never Smoker  Smokeless Tobacco Never Used   Counseling given: Not Answered   Outpatient Encounter Medications as of 02/09/2020  Medication Sig  . FLUoxetine (PROZAC) 20 MG capsule TAKE ONE CAPSULE BY MOUTH DAILY  . rizatriptan  (MAXALT) 10 MG tablet Take 1 tablet (10 mg total) by mouth as needed for migraine. May repeat in 2 hours if needed Failed Imitrex with side effects  . azithromycin (ZITHROMAX) 250 MG tablet Take 2 tablets (500 mg) on day 1, then take 1 tablet (250 mg) on days 2-5  . benzonatate (TESSALON) 200 MG capsule Take 1 capsule (200 mg total) by mouth 2 (two) times daily as needed for cough.  . Omega-3 1000 MG CAPS Take by mouth.  . predniSONE (DELTASONE) 20 MG tablet Take 1 tablet (20 mg total) by mouth daily with breakfast for 5 days.   No facility-administered encounter medications on file as of 02/09/2020.     Review of Systems  Review of Systems  Constitutional: Positive for fatigue and fever.  HENT: Negative.   Respiratory: Positive for cough and shortness of breath.   Cardiovascular: Negative.  Negative for chest pain, palpitations and leg swelling.  Gastrointestinal: Negative.   Allergic/Immunologic: Negative.   Neurological: Negative.   Psychiatric/Behavioral: Negative.        Physical Exam  BP 112/74 (BP Location: Left Arm, Patient Position: Sitting, Cuff Size: Small)   Pulse 80   Temp (!) 97.3 F (36.3 C)   Ht 4\' 11"  (1.499 m)   Wt 138 lb 8 oz (62.8 kg)   SpO2 98%   BMI 27.97 kg/m   Wt Readings from Last 5 Encounters:  02/09/20 138  lb 8 oz (62.8 kg)  11/04/19 142 lb (64.4 kg)  10/04/18 133 lb 8 oz (60.6 kg)  05/23/18 126 lb 6.4 oz (57.3 kg)  03/01/18 125 lb 9.6 oz (57 kg)     Physical Exam Vitals and nursing note reviewed.  Constitutional:      General: She is not in acute distress.    Appearance: She is well-developed.  Cardiovascular:     Rate and Rhythm: Normal rate and regular rhythm.  Pulmonary:     Effort: Pulmonary effort is normal.     Breath sounds: Rhonchi (left upper lobe) present.  Neurological:     Mental Status: She is alert and oriented to person, place, and time.        Assessment & Plan:   COVID-19 virus detected Cough Chest  congestion Fever:  Assessment: explained to patient that these symptoms are common during Covid. She needs some more time to recover. We can not get an x ray today due to recent fever and symptoms. Considering symptoms with productive cough and rhonchi noted on chest exam - will order azithromycin.   Plan:  Will order azithromycin  Will order prednisone  Will order tessalon perles for cough  May take mucinex DM twice daily  May take zyrtec daily  Encouraged deep breathing exercises  Stay active  Follow up:  Follow up in 2 weeks or sooner if needed      Fenton Foy, NP 02/09/2020

## 2020-02-09 NOTE — Patient Instructions (Signed)
Covid Cough Chest congestion Fever:  Will order azithromycin  Will order prednisone  Will order tessalon perles for cough  May take mucinex DM twice daily  May take zyrtec daily  Encouraged deep breathing exercises  Stay active  Follow up:  Follow up in 2 weeks or sooner if needed

## 2020-02-09 NOTE — Telephone Encounter (Signed)
Attempted to contact patient to discuss symptoms.

## 2020-02-09 NOTE — Assessment & Plan Note (Signed)
Cough Chest congestion Fever:  Assessment: explained to patient that these symptoms are common during Covid. She needs some more time to recover. We can not get an x ray today due to recent fever and symptoms. Considering symptoms with productive cough and rhonchi noted on chest exam - will order azithromycin.   Plan:  Will order azithromycin  Will order prednisone  Will order tessalon perles for cough  May take mucinex DM twice daily  May take zyrtec daily  Encouraged deep breathing exercises  Stay active  Follow up:  Follow up in 2 weeks or sooner if needed

## 2020-02-24 ENCOUNTER — Ambulatory Visit: Payer: 59 | Admitting: Nurse Practitioner

## 2020-02-24 ENCOUNTER — Other Ambulatory Visit: Payer: Self-pay

## 2020-02-24 VITALS — BP 108/80 | Temp 97.5°F | Ht 59.0 in | Wt 139.2 lb

## 2020-02-24 DIAGNOSIS — Z8616 Personal history of COVID-19: Secondary | ICD-10-CM | POA: Diagnosis not present

## 2020-02-24 HISTORY — DX: Personal history of COVID-19: Z86.16

## 2020-02-24 NOTE — Patient Instructions (Signed)
History of Covid:  Continue mucinex DM twice daily  Continue Flonase daily  May take zyrtec daily  Encouraged deep breathing exercises  Stay active  Follow up:  Follow up as needed

## 2020-02-24 NOTE — Progress Notes (Signed)
@Patient  ID: Whitney Trujillo, female    DOB: Oct 01, 1975, 44 y.o.   MRN: [id number]  Chief Complaint  Patient presents with  . Follow-up    Feeling better still having a bit of congestion.     Referring provider: Mosie Lukes, MD   44 year old female with history of PE, anxiety, and depression. Diagnosed with Covid on May 6th 2021.   HPI  Patient presents today for post Covid care clinic visit follow-up.  Patient states that she is much improved.  She does still complain of nasal congestion.  She states that she is no longer having any chest tightness or chest congestion.  She denies any significant shortness of breath.  She denies any loss of taste or smell.  She completed azithromycin and prednisone as prescribed at last visit.  She continues on Mucinex.  She is using Flonase daily.  Denies f/c/s, n/v/d, hemoptysis, PND, chest pain or edema.       Allergies  Allergen Reactions  . Latex Itching  . Gluten Meal Other (See Comments)    Cramping and diarrhea    Immunization History  Administered Date(s) Administered  . Influenza,inj,Quad PF,6+ Mos 05/29/2017  . Influenza-Unspecified 06/30/2014, 06/25/2018  . Tdap 10/04/2018    Past Medical History:  Diagnosis Date  . Acute right lower quadrant pain 01/25/2016  . ADD (attention deficit disorder) 02/02/2015  . Allergy    seasonal allergies  . Anxiety   . Depression with anxiety 03/15/2016  . Endometriosis   . Gallstones   . Hyperlipidemia, mixed 02/02/2015  . Migraine 12/16/2015  . Preventative health care 12/16/2015  . Pulmonary emboli (HCC)    b/l PE after hysterectomy, 5 weeks post op, clotting problem ruled out at [facility] History: Social History   Tobacco Use  Smoking Status Never Smoker  Smokeless Tobacco Never Used   Counseling given: Not Answered   Outpatient Encounter Medications as of 02/24/2020  Medication Sig  . FLUoxetine (PROZAC) 20 MG capsule TAKE ONE CAPSULE BY MOUTH DAILY  .  Omega-3 1000 MG CAPS Take by mouth.  . rizatriptan (MAXALT) 10 MG tablet Take 1 tablet (10 mg total) by mouth as needed for migraine. May repeat in 2 hours if needed Failed Imitrex with side effects  . benzonatate (TESSALON) 200 MG capsule Take 1 capsule (200 mg total) by mouth 2 (two) times daily as needed for cough. (Patient not taking: Reported on 02/24/2020)  . [DISCONTINUED] azithromycin (ZITHROMAX) 250 MG tablet Take 2 tablets (500 mg) on day 1, then take 1 tablet (250 mg) on days 2-5   No facility-administered encounter medications on file as of 02/24/2020.     Review of Systems  Review of Systems  Constitutional: Negative.  Negative for chills, fatigue and fever.  HENT: Positive for congestion.   Respiratory: Negative for cough and shortness of breath.   Cardiovascular: Negative.  Negative for chest pain, palpitations and leg swelling.  Gastrointestinal: Negative.   Allergic/Immunologic: Negative.   Neurological: Negative.   Psychiatric/Behavioral: Negative.        Physical Exam  BP 108/80 (BP Location: Left Arm, Patient Position: Sitting, Cuff Size: Small)   Temp (!) 97.5 F (36.4 C)   Ht 4\' 11"  (1.499 m)   Wt 139 lb 4 oz (63.2 kg)   BMI 28.13 kg/m   Wt Readings from Last 5 Encounters:  02/24/20 139 lb 4 oz (63.2 kg)  02/09/20 138 lb 8 oz (62.8 kg)  11/04/19  142 lb (64.4 kg)  10/04/18 133 lb 8 oz (60.6 kg)  05/23/18 126 lb 6.4 oz (57.3 kg)     Physical Exam Vitals and nursing note reviewed.  Constitutional:      General: She is not in acute distress.    Appearance: She is well-developed.  Cardiovascular:     Rate and Rhythm: Normal rate and regular rhythm.  Pulmonary:     Effort: Pulmonary effort is normal. No respiratory distress.     Breath sounds: Normal breath sounds. No wheezing or rhonchi.  Musculoskeletal:     Right lower leg: No edema.     Left lower leg: No edema.  Neurological:     Mental Status: She is alert and oriented to person, place, and  time.  Psychiatric:        Mood and Affect: Mood normal.        Behavior: Behavior normal.        Assessment & Plan:   History of COVID-19 Assessment: Patient is much improved. Still has some lingering sinus congestion. Lungs sound clear. Denies any significant cough or shortness of breath. Will defer chest x ray at this time.    Plan:  Continue mucinex DM twice daily  Continue Flonase daily  May take zyrtec daily  Encouraged deep breathing exercises  Stay active  Follow up:  Follow up as needed      Ivonne Andrew, NP 02/24/2020

## 2020-02-24 NOTE — Assessment & Plan Note (Signed)
Assessment: Patient is much improved. Still has some lingering sinus congestion. Lungs sound clear. Denies any significant cough or shortness of breath. Will defer chest x ray at this time.    Plan:  Continue mucinex DM twice daily  Continue Flonase daily  May take zyrtec daily  Encouraged deep breathing exercises  Stay active  Follow up:  Follow up as needed

## 2020-03-07 ENCOUNTER — Emergency Department (HOSPITAL_BASED_OUTPATIENT_CLINIC_OR_DEPARTMENT_OTHER): Payer: 59

## 2020-03-07 ENCOUNTER — Emergency Department (HOSPITAL_BASED_OUTPATIENT_CLINIC_OR_DEPARTMENT_OTHER)
Admission: EM | Admit: 2020-03-07 | Discharge: 2020-03-08 | Disposition: A | Discharge status: Home / Self Care | Payer: 59 | Attending: Emergency Medicine | Admitting: Emergency Medicine

## 2020-03-07 ENCOUNTER — Other Ambulatory Visit: Payer: Self-pay

## 2020-03-07 ENCOUNTER — Encounter (HOSPITAL_BASED_OUTPATIENT_CLINIC_OR_DEPARTMENT_OTHER): Payer: Self-pay

## 2020-03-07 DIAGNOSIS — Z7982 Long term (current) use of aspirin: Secondary | ICD-10-CM | POA: Insufficient documentation

## 2020-03-07 DIAGNOSIS — R519 Headache, unspecified: Secondary | ICD-10-CM | POA: Diagnosis present

## 2020-03-07 DIAGNOSIS — Z91040 Latex allergy status: Secondary | ICD-10-CM | POA: Diagnosis not present

## 2020-03-07 LAB — CBC WITH DIFFERENTIAL/PLATELET
Abs Immature Granulocytes: 0.01 10*3/uL (ref 0.00–0.07)
Basophils Absolute: 0 10*3/uL (ref 0.0–0.1)
Basophils Relative: 0 %
Eosinophils Absolute: 0 10*3/uL (ref 0.0–0.5)
Eosinophils Relative: 1 %
HCT: 45.2 % (ref 36.0–46.0)
Hemoglobin: 15.2 g/dL — ABNORMAL HIGH (ref 12.0–15.0)
Immature Granulocytes: 0 %
Lymphocytes Relative: 31 %
Lymphs Abs: 2.3 10*3/uL (ref 0.7–4.0)
MCH: 29.3 pg (ref 26.0–34.0)
MCHC: 33.6 g/dL (ref 30.0–36.0)
MCV: 87.3 fL (ref 80.0–100.0)
Monocytes Absolute: 0.6 10*3/uL (ref 0.1–1.0)
Monocytes Relative: 7 %
Neutro Abs: 4.5 10*3/uL (ref 1.7–7.7)
Neutrophils Relative %: 61 %
Platelets: 271 10*3/uL (ref 150–400)
RBC: 5.18 MIL/uL — ABNORMAL HIGH (ref 3.87–5.11)
RDW: 13.2 % (ref 11.5–15.5)
WBC: 7.4 10*3/uL (ref 4.0–10.5)
nRBC: 0 % (ref 0.0–0.2)

## 2020-03-07 LAB — BASIC METABOLIC PANEL
Anion gap: 12 (ref 5–15)
BUN: 10 mg/dL (ref 6–20)
CO2: 25 mmol/L (ref 22–32)
Calcium: 9.1 mg/dL (ref 8.9–10.3)
Chloride: 102 mmol/L (ref 98–111)
Creatinine, Ser: 0.8 mg/dL (ref 0.44–1.00)
GFR calc Af Amer: >60 mL/min (ref >60)
GFR calc non Af Amer: >60 mL/min (ref >60)
Glucose, Bld: 109 mg/dL — ABNORMAL HIGH (ref 70–99)
Potassium: 3.4 mmol/L — ABNORMAL LOW (ref 3.5–5.1)
Sodium: 139 mmol/L (ref 135–145)

## 2020-03-07 MED ORDER — FENTANYL CITRATE (PF) 100 MCG/2ML IJ SOLN
50.0000 ug | Freq: Once | INTRAMUSCULAR | Status: AC
  Administered 2020-03-07: 50 ug via INTRAVENOUS
  Filled 2020-03-07: qty 2

## 2020-03-07 MED ORDER — IOHEXOL 350 MG/ML SOLN
100.0000 mL | Freq: Once | INTRAVENOUS | Status: AC | PRN
  Administered 2020-03-07: 100 mL via INTRAVENOUS

## 2020-03-07 MED ORDER — DIPHENHYDRAMINE HCL 50 MG/ML IJ SOLN
25.0000 mg | Freq: Once | INTRAMUSCULAR | Status: AC
  Administered 2020-03-07: 25 mg via INTRAVENOUS
  Filled 2020-03-07: qty 1

## 2020-03-07 MED ORDER — METOCLOPRAMIDE HCL 5 MG/ML IJ SOLN
10.0000 mg | Freq: Once | INTRAMUSCULAR | Status: AC
  Administered 2020-03-07: 10 mg via INTRAVENOUS
  Filled 2020-03-07: qty 2

## 2020-03-07 NOTE — Discharge Instructions (Addendum)
Please read and follow all provided instructions.  Your diagnoses today include:  1. Acute nonintractable headache, unspecified headache type     Tests performed today include: CT of the blood vessels of your head and neck which was normal and did not show any serious cause of your headache Vital signs. See below for your results today.  Lumbar puncture was offered which you declined.  There is a small chance of missing a serious problem such as a subarachnoid hemorrhage.  Medications:  In the Emergency Department you received: Reglan - antinausea/headache medication Benadryl - antihistamine to counteract potential side effects of reglan Fentanyl - pain medication  Take any prescribed medications only as directed.  Additional information:  Follow any educational materials contained in this packet.  You are having a headache. No specific cause was found today for your headache. It may have been a migraine or other cause of headache. Stress, anxiety, fatigue, and depression are common triggers for headaches.   Your headache today does not appear to be life-threatening or require hospitalization, but often the exact cause of headaches is not determined in the emergency department. Therefore, follow-up with your doctor is very important to find out what may have caused your headache and whether or not you need any further diagnostic testing or treatment.   Sometimes headaches can appear benign (not harmful), but then more serious symptoms can develop which should prompt an immediate re-evaluation by your doctor or the emergency department.  BE VERY CAREFUL not to take multiple medicines containing Tylenol (also called acetaminophen). Doing so can lead to an overdose which can damage your liver and cause liver failure and possibly death.   Follow-up instructions: Please follow-up with your primary care provider in the next 2 days for further evaluation of your symptoms.   Return  instructions:  Please return to the Emergency Department if you experience worsening symptoms. Return if the medications do not resolve your headache, if it recurs, or if you have multiple episodes of vomiting or cannot keep down fluids. Return if you have a change from the usual headache. RETURN IMMEDIATELY IF you: Develop a sudden, severe headache Develop confusion or become poorly responsive or faint Develop a fever above 100.57F or problem breathing Have a change in speech, vision, swallowing, or understanding Develop new weakness, numbness, tingling, incoordination in your arms or legs Have a seizure Please return if you have any other emergent concerns.  Additional Information:  Your vital signs today were: BP (!) 156/87 (BP Location: Right Arm)   Pulse 79   Temp 98 F (36.7 C) (Oral)   Resp 18   Ht 4\' 11"  (1.499 m)   Wt 63.2 kg   SpO2 100%   BMI 28.12 kg/m  If your blood pressure (BP) was elevated above 135/85 this visit, please have this repeated by your doctor within one month. --------------

## 2020-03-07 NOTE — ED Notes (Signed)
Pharmacy and medications updated with patient 

## 2020-03-07 NOTE — ED Triage Notes (Signed)
Pt states that she has been having headaches after having sex, reports that she has had a headache with neck pain today, and elevated BP. Pt reports that she had Covid about 5 weeks ago and reports history of blood clots. Pt took 325 ASA and 1000 mg tylenol PTA and her migraine medication.

## 2020-03-07 NOTE — ED Provider Notes (Signed)
[facility] EMERGENCY DEPARTMENT Provider Note   CSN: [id number] Arrival date & time: 03/07/20  1748     History Chief Complaint  Patient presents with  . Headache    Whitney Trujillo is a 44 y.o. female.  Patient with history of migraine headaches, history of pulmonary embolism after hysterectomy not currently on anticoagulation --presents to the emergency department today with acute onset of headache this morning.  Symptoms started while having intercourse.  Patient describes a severe generalized headache and pain into her neck.  Pain does not worsen with movement of her head.  She has not had any vision changes, vision loss, vomiting, or confusion.  She denies numbness, weakness, or tingling into her arms or legs.  She works as a Theme park manager and reports a tight sensation in her right upper arm which has her concerned as well because of her history of blood clots.  She denies any swelling of the arm or color changes of the hand or forearm.  Patient has taken Tylenol, aspirin, and prescribed migraine medication without any improvement in her symptoms.  She states that the pain is 9 out of 10.  No chest pain or shortness of breath.  No fevers.        Past Medical History:  Diagnosis Date  . Acute right lower quadrant pain 01/25/2016  . ADD (attention deficit disorder) 02/02/2015  . Allergy    seasonal allergies  . Anxiety   . Depression with anxiety 03/15/2016  . Endometriosis   . Gallstones   . Hyperlipidemia, mixed 02/02/2015  . Migraine 12/16/2015  . Preventative health care 12/16/2015  . Pulmonary emboli (HCC)    b/l PE after hysterectomy, 5 weeks post op, clotting problem ruled out at [facility]    Patient Active Problem List   Diagnosis Date Noted  . History of COVID-19 02/24/2020  . COVID-19 virus detected 02/09/2020  . Bilateral carpal tunnel syndrome 10/19/2017  . Depression with anxiety 03/15/2016  . Preventative health care 12/16/2015  . Migraine 12/16/2015   . Hyperlipidemia, mixed 02/02/2015  . Vitamin D deficiency 02/02/2015  . ADD (attention deficit disorder) 02/02/2015  . Allergy   . Endometriosis 07/07/2014  . Acute pulmonary embolism  5 wks post hysterectomy 07/07/2014    Past Surgical History:  Procedure Laterality Date  . ABDOMINAL HYSTERECTOMY     ovaries removed, in 2 separate procedures  . CESAREAN SECTION  2007, 2009  . CHOLECYSTECTOMY    . CYSTOSCOPY  10/2014  . EYE SURGERY     lasik b/l  . LAPAROTOMY     Endometriosis  . WISDOM TOOTH EXTRACTION       OB History   No obstetric history on file.     Family History  Problem Relation Age of Onset  . Cancer Father        Carcinoid Syndrome, colon  . Colon cancer Father   . Diabetes Mother        2, diet controlled  . ADD / ADHD Son   . Heart disease Maternal Grandmother   . Stroke Paternal Grandmother   . Dementia Paternal Grandmother   . COPD Paternal Grandfather        black lun, Ecologist, tobacco  . Ovarian cancer Maternal Aunt     Social History   Tobacco Use  . Smoking status: Never Smoker  . Smokeless tobacco: Never Used  Substance Use Topics  . Alcohol use: Yes    Comment: Socially  . Drug use:  No    Home Medications Prior to Admission medications   Medication Sig Start Date End Date Taking? Authorizing Provider  benzonatate (TESSALON) 200 MG capsule Take 1 capsule (200 mg total) by mouth 2 (two) times daily as needed for cough. Patient not taking: Reported on 02/24/2020 02/09/20   Ivonne Andrew, NP  FLUoxetine (PROZAC) 20 MG capsule TAKE ONE CAPSULE BY MOUTH DAILY 12/14/19   Bradd Canary, MD  Omega-3 1000 MG CAPS Take by mouth.    [provider]  rizatriptan (MAXALT) 10 MG tablet Take 1 tablet (10 mg total) by mouth as needed for migraine. May repeat in 2 hours if needed Failed Imitrex with side effects 11/04/19   Bradd Canary, MD    Allergies    Latex and Gluten meal  Review of Systems   Review of Systems    Constitutional: Negative for fever.  HENT: Negative for congestion, dental problem, rhinorrhea and sinus pressure.   Eyes: Negative for photophobia, discharge, redness and visual disturbance.  Respiratory: Negative for shortness of breath.   Cardiovascular: Negative for chest pain.  Gastrointestinal: Negative for nausea and vomiting.  Musculoskeletal: Positive for myalgias and neck pain. Negative for gait problem, joint swelling and neck stiffness.  Skin: Negative for rash.  Neurological: Positive for headaches. Negative for syncope, speech difficulty, weakness, light-headedness and numbness.  Psychiatric/Behavioral: Negative for confusion.    Physical Exam Updated Vital Signs BP (!) 156/87 (BP Location: Right Arm)   Pulse 79   Temp 98 F (36.7 C) (Oral)   Resp 18   Ht 4\' 11"  (1.499 m)   Wt 63.2 kg   SpO2 100%   BMI 28.12 kg/m   Physical Exam Vitals and nursing note reviewed.  Constitutional:      Appearance: She is well-developed.  HENT:     Head: Normocephalic and atraumatic.     Right Ear: Tympanic membrane, ear canal and external ear normal.     Left Ear: Tympanic membrane, ear canal and external ear normal.     Nose: Nose normal.     Mouth/Throat:     Pharynx: Uvula midline.  Eyes:     General: Lids are normal.     Extraocular Movements:     Right eye: No nystagmus.     Left eye: No nystagmus.     Conjunctiva/sclera: Conjunctivae normal.     Pupils: Pupils are equal, round, and reactive to light.  Neck:     Comments: No meningismus noted. Cardiovascular:     Rate and Rhythm: Normal rate and regular rhythm.  Pulmonary:     Effort: Pulmonary effort is normal.     Breath sounds: Normal breath sounds.  Abdominal:     Palpations: Abdomen is soft.     Tenderness: There is no abdominal tenderness.  Musculoskeletal:     Cervical back: Normal range of motion and neck supple. No tenderness or bony tenderness.  Skin:    General: Skin is warm and dry.   Neurological:     Mental Status: She is alert and oriented to person, place, and time.     GCS: GCS eye subscore is 4. GCS verbal subscore is 5. GCS motor subscore is 6.     Cranial Nerves: No cranial nerve deficit.     Sensory: No sensory deficit.     Coordination: Coordination normal.     Gait: Gait normal.     Deep Tendon Reflexes: Reflexes are normal and symmetric.     ED  Results / Procedures / Treatments   Labs (all labs ordered are listed, but only abnormal results are displayed) Labs Reviewed  CBC WITH DIFFERENTIAL/PLATELET - Abnormal; Notable for the following components:      Result Value   RBC 5.18 (*)    Hemoglobin 15.2 (*)    All other components within normal limits  BASIC METABOLIC PANEL - Abnormal; Notable for the following components:   Potassium 3.4 (*)    Glucose, Bld 109 (*)    All other components within normal limits    EKG None  Radiology CT Angio Head W or Wo Contrast  Result Date: 03/07/2020 CLINICAL DATA:  Post coital headache EXAM: CT ANGIOGRAPHY HEAD AND NECK TECHNIQUE: Multidetector CT imaging of the head and neck was performed using the standard protocol during bolus administration of intravenous contrast. Multiplanar CT image reconstructions and MIPs were obtained to evaluate the vascular anatomy. Carotid stenosis measurements (when applicable) are obtained utilizing NASCET criteria, using the distal internal carotid diameter as the denominator. CONTRAST:  100mL OMNIPAQUE IOHEXOL 350 MG/ML SOLN COMPARISON:  None. FINDINGS: CT HEAD FINDINGS Brain: There is no mass, hemorrhage or extra-axial collection. The size and configuration of the ventricles and extra-axial CSF spaces are normal. There is no acute or chronic infarction. The brain parenchyma is normal. Skull: The visualized skull base, calvarium and extracranial soft tissues are normal. Sinuses/Orbits: No fluid levels or advanced mucosal thickening of the visualized paranasal sinuses. No mastoid or  middle ear effusion. The orbits are normal. CTA NECK FINDINGS SKELETON: There is no bony spinal canal stenosis. No lytic or blastic lesion. OTHER NECK: Normal pharynx, larynx and major salivary glands. No cervical lymphadenopathy. Unremarkable thyroid gland. UPPER CHEST: No pneumothorax or pleural effusion. No nodules or masses. AORTIC ARCH: There is no calcific atherosclerosis of the aortic arch. There is no aneurysm, dissection or hemodynamically significant stenosis of the visualized portion of the aorta. Conventional 3 vessel aortic branching pattern. The visualized proximal subclavian arteries are widely patent. RIGHT CAROTID SYSTEM: Normal without aneurysm, dissection or stenosis. LEFT CAROTID SYSTEM: Normal without aneurysm, dissection or stenosis. VERTEBRAL ARTERIES: Left dominant configuration. Both origins are clearly patent. There is no dissection, occlusion or flow-limiting stenosis to the skull base (V1-V3 segments). CTA HEAD FINDINGS POSTERIOR CIRCULATION: --Vertebral arteries: Normal V4 segments. --Inferior cerebellar arteries: Normal. --Basilar artery: Normal. --Superior cerebellar arteries: Normal. --Posterior cerebral arteries (PCA): Normal. ANTERIOR CIRCULATION: --Intracranial internal carotid arteries: Normal. --Anterior cerebral arteries (ACA): Normal. Both A1 segments are present. Patent anterior communicating artery (a-comm). --Middle cerebral arteries (MCA): Normal. VENOUS SINUSES: As permitted by contrast timing, patent. ANATOMIC VARIANTS: None Review of the MIP images confirms the above findings. IMPRESSION: Normal CTA of the head and neck. Electronically Signed   By: Deatra RobinsonKevin  Herman M.D.   On: 03/07/2020 23:00   CT Angio Neck W and/or Wo Contrast  Result Date: 03/07/2020 CLINICAL DATA:  Post coital headache EXAM: CT ANGIOGRAPHY HEAD AND NECK TECHNIQUE: Multidetector CT imaging of the head and neck was performed using the standard protocol during bolus administration of intravenous  contrast. Multiplanar CT image reconstructions and MIPs were obtained to evaluate the vascular anatomy. Carotid stenosis measurements (when applicable) are obtained utilizing NASCET criteria, using the distal internal carotid diameter as the denominator. CONTRAST:  100mL OMNIPAQUE IOHEXOL 350 MG/ML SOLN COMPARISON:  None. FINDINGS: CT HEAD FINDINGS Brain: There is no mass, hemorrhage or extra-axial collection. The size and configuration of the ventricles and extra-axial CSF spaces are normal. There is no acute or  chronic infarction. The brain parenchyma is normal. Skull: The visualized skull base, calvarium and extracranial soft tissues are normal. Sinuses/Orbits: No fluid levels or advanced mucosal thickening of the visualized paranasal sinuses. No mastoid or middle ear effusion. The orbits are normal. CTA NECK FINDINGS SKELETON: There is no bony spinal canal stenosis. No lytic or blastic lesion. OTHER NECK: Normal pharynx, larynx and major salivary glands. No cervical lymphadenopathy. Unremarkable thyroid gland. UPPER CHEST: No pneumothorax or pleural effusion. No nodules or masses. AORTIC ARCH: There is no calcific atherosclerosis of the aortic arch. There is no aneurysm, dissection or hemodynamically significant stenosis of the visualized portion of the aorta. Conventional 3 vessel aortic branching pattern. The visualized proximal subclavian arteries are widely patent. RIGHT CAROTID SYSTEM: Normal without aneurysm, dissection or stenosis. LEFT CAROTID SYSTEM: Normal without aneurysm, dissection or stenosis. VERTEBRAL ARTERIES: Left dominant configuration. Both origins are clearly patent. There is no dissection, occlusion or flow-limiting stenosis to the skull base (V1-V3 segments). CTA HEAD FINDINGS POSTERIOR CIRCULATION: --Vertebral arteries: Normal V4 segments. --Inferior cerebellar arteries: Normal. --Basilar artery: Normal. --Superior cerebellar arteries: Normal. --Posterior cerebral arteries (PCA): Normal.  ANTERIOR CIRCULATION: --Intracranial internal carotid arteries: Normal. --Anterior cerebral arteries (ACA): Normal. Both A1 segments are present. Patent anterior communicating artery (a-comm). --Middle cerebral arteries (MCA): Normal. VENOUS SINUSES: As permitted by contrast timing, patent. ANATOMIC VARIANTS: None Review of the MIP images confirms the above findings. IMPRESSION: Normal CTA of the head and neck. Electronically Signed   By: Deatra Robinson M.D.   On: 03/07/2020 23:00    Procedures Procedures (including critical care time)  Medications Ordered in ED Medications  metoCLOPramide (REGLAN) injection 10 mg (10 mg Intravenous Given 03/07/20 2202)  diphenhydrAMINE (BENADRYL) injection 25 mg (25 mg Intravenous Given 03/07/20 2159)  iohexol (OMNIPAQUE) 350 MG/ML injection 100 mL (100 mLs Intravenous Contrast Given 03/07/20 2239)  fentaNYL (SUBLIMAZE) injection 50 mcg (50 mcg Intravenous Given 03/07/20 2300)    ED Course  I have reviewed the triage vital signs and the nursing notes.  Pertinent labs & imaging results that were available during my care of the patient were reviewed by me and considered in my medical decision making (see chart for details).  Patient seen and examined.  Work-up is reassuring with normal mentation.  Given abrupt onset of headache in setting of intercourse/exertion, subarachnoid hemorrhage is on the differential.  Patient will be evaluated with CTA of the head and neck.  Labs ordered.  She will be given migraine cocktail.  Discussed with Dr. Jacqulyn Bath.   Vital signs reviewed and are as follows: BP (!) 156/87 (BP Location: Right Arm)   Pulse 79   Temp 98 F (36.7 C) (Oral)   Resp 18   Ht 4\' 11"  (1.499 m)   Wt 63.2 kg   SpO2 100%   BMI 28.12 kg/m   10:51 PM CTA performed, awaiting results.   Pt rechecked pain improved to 7/10. fentanyl ordered. Dr. to see.   11:06 PM CTA normal. Pt seen by Dr. Jacqulyn Bath. HA improving. Will need rechecked. We do not feel  that LP is required at this point in time.   Dr. Jacqulyn Bath aware of patient. Sign out at shift change for recheck of symptoms.      MDM Rules/Calculators/A&P                          Patient without high-risk features of headache including: no similar headache in past, altered mental status,  accompanying seizure, age > 43, history of immunocompromise, neck or shoulder pain, fever, use of anticoagulation, family history of spontaneous SAH, concomitant drug use, toxic exposure.   Patient has a normal complete neurological exam, normal vital signs, normal level of consciousness, no signs of meningismus, is well-appearing/non-toxic appearing, no signs of trauma.   Given abrupt onset of symptoms during exertion, CTA of the head and neck were performed.  Imaging was normal.  No dangerous or life-threatening conditions suspected or identified by history, physical exam, and by work-up. No indications for hospitalization identified.   Final Clinical Impression(s) / ED Diagnoses Final diagnoses:  Acute nonintractable headache, unspecified headache type    Rx / DC Orders ED Discharge Orders    None       Renne Crigler, PA-C 03/07/20 2308    Maia Plan, MD 03/09/20 1947

## 2020-03-08 ENCOUNTER — Encounter: Payer: Self-pay | Admitting: Family Medicine

## 2020-03-08 NOTE — ED Notes (Signed)
Coke given per pt request and MD order.

## 2020-03-08 NOTE — ED Notes (Signed)
Coke given for po challenge.  Tolerating well so far.

## 2020-03-08 NOTE — ED Provider Notes (Signed)
Care assumed from Dr. Jacqulyn Bath and [facility].  Patient with sudden onset headache during intercourse that has since improved.  Similar to previous migraine but more severe. Has had postcoital headaches in the past but never this severe.  Nonfocal neurological exam.  CTA is reassuring without aneurysm.  Patient did decline lumbar puncture. Awaiting recheck after symptom control.  On recheck, patient is feeling better.  Her headache has improved and she wishes to go home.  She is tolerating p.o.  Lumbar puncture again was discussed with her and she declined.  She understands that subarachnoid hemorrhage is not completely ruled out but there is no evidence of aneurysm and non-aneurysmal subarachnoid hemorrhage is still possible about 1% of the time. She understands this and continues to decline lumbar puncture wishes to go home.  She will be given follow-up information for neurology. She appears to have capacity to refuse lumbar puncture. She understands to return if symptoms worsen. Neurology referral given.    Glynn Octave, MD 03/08/20 [phone]

## 2020-05-04 ENCOUNTER — Encounter: Payer: Self-pay | Admitting: Neurology

## 2020-05-04 ENCOUNTER — Ambulatory Visit: Payer: 59 | Admitting: Neurology

## 2020-05-04 VITALS — BP 114/78 | HR 65 | Ht 59.0 in | Wt 140.0 lb

## 2020-05-04 DIAGNOSIS — G43009 Migraine without aura, not intractable, without status migrainosus: Secondary | ICD-10-CM

## 2020-05-04 DIAGNOSIS — G4484 Primary exertional headache: Secondary | ICD-10-CM | POA: Insufficient documentation

## 2020-05-04 DIAGNOSIS — M5481 Occipital neuralgia: Secondary | ICD-10-CM | POA: Insufficient documentation

## 2020-05-04 DIAGNOSIS — G43709 Chronic migraine without aura, not intractable, without status migrainosus: Secondary | ICD-10-CM | POA: Insufficient documentation

## 2020-05-04 HISTORY — DX: Occipital neuralgia: M54.81

## 2020-05-04 MED ORDER — GABAPENTIN 300 MG PO CAPS: 300.0000 mg | ORAL_CAPSULE | Freq: Three times a day (TID) | ORAL | 1 refills | Status: DC | PRN

## 2020-05-04 NOTE — Progress Notes (Signed)
[facility]    Provider:  Dr Lucia Gaskins Requesting Provider: Emergency room Primary Care Provider:  Bradd Canary, MD  CC: Headaches  HPI:  Whitney Trujillo is a 44 y.o. female here as requested by emergency room for headaches.  Past medical history migraines, hyperlipidemia, depression, anxiety, ADD.  I reviewed emergency room notes, patient had sudden onset headache during intercourse March 08, 2020 when she was seen in the emergency room it had improved, similar to previous migraine but more severe, has had post ictal headaches in the past but never this severe, neurologic exam is nonfocal, CTA was reassuring without aneurysm, she declined lumbar puncture, her headache improved and she wished to go home, there was no evidence of aneurysm or subarachnoid hemorrhage but she was warned that about 1% of the time its not seen on CAT scan she still declined lumbar puncture.  She described it as a severe generalized headache and pain into her neck, not worsening on movement, no vision changes, vomiting or confusion, no numbness weakness or tingling into her arms or legs, she works as a Interior and spatial designer, she has a history of blood clots, Tylenol and migraine medication did not help and the pain was 9 out of 10.  She has had headache at orgasm 4 times, this last one was very severe 03/07/2020, 9/10 pain, CTA was negative and she declined LP, since then she saw an orthopaedist because her neck and back always hurt she had an MRI cervical spine and was started on Gabapentin which has helped her headaches. She was sent to Rheumatology for joint pain to evaluate for RF or other but hasn;t gone yet (she has psoriasis), She has a history of migraines especially with her cycles, pusating/pounding, light and sound sensitivity, nausea, vomiting, has had them many years stable 2x a month, maxalt heps with the migraines may have to take it twice, taking gabapentin 1-2x a day and has helped with the  migraines, no exertional headaches since June. 2 migraines a month and rarely coital headache. She has had a few sharp pains in the morning when she is standing and getting ready and she leans over a little bit, positional. When the headache happens during sex it starts in the back of the head and radiates and shoots up the head of the back of the eye.No other focal neurologic deficits, associated symptoms, inciting events or modifiable factors.    Reviewed notes, labs and imaging from outside physicians, which showed:  Personally revewed images and agree with the following  Brain: There is no mass, hemorrhage or extra-axial collection. The size and configuration of the ventricles and extra-axial CSF spaces are normal. There is no acute or chronic infarction. The brain parenchyma is normal.  Skull: The visualized skull base, calvarium and extracranial soft tissues are normal.  Sinuses/Orbits: No fluid levels or advanced mucosal thickening of the visualized paranasal sinuses. No mastoid or middle ear effusion. The orbits are normal.  CTA NECK FINDINGS  SKELETON: There is no bony spinal canal stenosis. No lytic or blastic lesion.  OTHER NECK: Normal pharynx, larynx and major salivary glands. No cervical lymphadenopathy. Unremarkable thyroid gland.  UPPER CHEST: No pneumothorax or pleural effusion. No nodules or masses.  AORTIC ARCH:  There is no calcific atherosclerosis of the aortic arch. There is no aneurysm, dissection or hemodynamically significant stenosis of the visualized portion of the aorta. Conventional 3 vessel aortic branching pattern. The visualized proximal subclavian arteries are widely patent.  RIGHT CAROTID SYSTEM: Normal  without aneurysm, dissection or stenosis.  LEFT CAROTID SYSTEM: Normal without aneurysm, dissection or stenosis.  VERTEBRAL ARTERIES: Left dominant configuration. Both origins are clearly patent. There is no dissection,  occlusion or flow-limiting stenosis to the skull base (V1-V3 segments).  CTA HEAD FINDINGS  POSTERIOR CIRCULATION:  --Vertebral arteries: Normal V4 segments.  --Inferior cerebellar arteries: Normal.  --Basilar artery: Normal.  --Superior cerebellar arteries: Normal.  --Posterior cerebral arteries (PCA): Normal.  ANTERIOR CIRCULATION:  --Intracranial internal carotid arteries: Normal.  --Anterior cerebral arteries (ACA): Normal. Both A1 segments are present. Patent anterior communicating artery (a-comm).  --Middle cerebral arteries (MCA): Normal.  VENOUS SINUSES: As permitted by contrast timing, patent.  ANATOMIC VARIANTS: None  Review of the MIP images confirms the above findings.  IMPRESSION: Normal CTA of the head and neck.  Reviewed MRI of the cervical spine report April 12, 2020 which showed severe left C2-C3 neural foraminal narrowing, impinging upon the exiting left C3 nerve roots, due to severe left uncovertebral osteophytosis and severe left facet arthrosis, moderate right C7-T1 neuroforaminal narrowing due to severe right facet arthrosis, mild left C5-C6 neural foraminal narrowing, partially visualized moderate broad central T3-T4 disc osteophyte complex with mild/moderate deformity of central cord without spinal canal stenosis, straightening of the normal cervical lordosis.   Review of Systems: Patient complains of symptoms per HPI as well as the following symptoms: Headache, neck pain, shoulder pain, paresthesias. Pertinent negatives and positives per HPI. All others negative.   Social History   Socioeconomic History  . Marital status: Married    Spouse name: Not on file  . Number of children: Not on file  . Years of education: Not on file  . Highest education level: Not on file  Occupational History  . Not on file  Tobacco Use  . Smoking status: Never Smoker  . Smokeless tobacco: Never Used  Vaping Use  . Vaping Use: Never used   Substance and Sexual Activity  . Alcohol use: Yes    Comment: Socially  . Drug use: No  . 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 Not on file  Social History Narrative   Lives at home with husband and 2 children   Right handed   Caffeine: 2-3 cups/day   Social Determinants of Health   Financial Resource Strain:   . Difficulty of Paying Living Expenses:   Food Insecurity:   . Worried About Programme researcher, broadcasting/film/video in the Last Year:   . Barista in the Last Year:   Transportation Needs:   . Freight forwarder (Medical):   Marland Kitchen Lack of Transportation (Non-Medical):   Physical Activity:   . Days of Exercise per Week:   . Minutes of Exercise per Session:   Stress:   . Feeling of Stress :   Social Connections:   . Frequency of Communication with Friends and Family:   . Frequency of Social Gatherings with Friends and Family:   . Attends Religious Services:   . Active Member of Clubs or Organizations:   . Attends Banker Meetings:   Marland Kitchen Marital Status:   Intimate Partner Violence:   . Fear of Current or Ex-Partner:   . Emotionally Abused:   Marland Kitchen Physically Abused:   . Sexually Abused:     Family History  Problem Relation Age of Onset  .  Cancer Father        Carcinoid Syndrome, colon  . Colon cancer Father   . Headache Father   . Diabetes Mother        2, diet controlled  . ADD / ADHD Son   . Heart disease Maternal Grandmother   . Stroke Paternal Grandmother   . Dementia Paternal Grandmother   . COPD Paternal Grandfather        black lun, Forensic scientist, tobacco  . Ovarian cancer Maternal Aunt     Past Medical History:  Diagnosis Date  . Acute right lower quadrant pain 01/25/2016  . ADD (attention deficit disorder) 02/02/2015  . Allergy    seasonal allergies  . Anxiety   . Depression with  anxiety 03/15/2016  . Endometriosis   . Gallstones   . Hyperlipidemia, mixed 02/02/2015  . Migraine 12/16/2015  . Preventative health care 12/16/2015  . Pulmonary emboli (HCC)    b/l PE after hysterectomy, 5 weeks post op, clotting problem ruled out at [facility]    Patient Active Problem List   Diagnosis Date Noted  . Bilateral occipital neuralgia 05/04/2020  . Exertional headache 05/04/2020  . Migraine without aura and without status migrainosus, not intractable 05/04/2020  . History of COVID-19 02/24/2020  . COVID-19 virus detected 02/09/2020  . Bilateral carpal tunnel syndrome 10/19/2017  . Depression with anxiety 03/15/2016  . Preventative health care 12/16/2015  . Migraine 12/16/2015  . Hyperlipidemia, mixed 02/02/2015  . Vitamin D deficiency 02/02/2015  . ADD (attention deficit disorder) 02/02/2015  . Allergy   . Endometriosis 07/07/2014  . Acute pulmonary embolism  5 wks post hysterectomy 07/07/2014    Past Surgical History:  Procedure Laterality Date  . ABDOMINAL HYSTERECTOMY     ovaries removed, in 2 separate procedures  . CESAREAN SECTION  2007, 2009  . CHOLECYSTECTOMY    . CYSTOSCOPY  10/2014  . EYE SURGERY     lasik b/l  . LAPAROTOMY     Endometriosis  . WISDOM TOOTH EXTRACTION      Current Outpatient Medications  Medication Sig Dispense Refill  . B Complex Vitamins (B COMPLEX PO) Take by mouth.    Marland Kitchen FLUoxetine (PROZAC) 20 MG capsule TAKE ONE CAPSULE BY MOUTH DAILY 90 capsule 1  . gabapentin (NEURONTIN) 300 MG capsule Take 1 capsule (300 mg total) by mouth 3 (three) times daily as needed. 270 capsule 1  . Multiple Vitamins-Minerals (ZINC PO) Take by mouth.    . Omega-3 1000 MG CAPS Take by mouth.    . rizatriptan (MAXALT) 10 MG tablet Take 1 tablet (10 mg total) by mouth as needed for migraine. May repeat in 2 hours if needed Failed Imitrex with side effects 10 tablet 5  . UNABLE TO FIND Med Name: hormone inserts surgically implanted in hip    . VITAMIN D PO  Take by mouth. Liquid     No current facility-administered medications for this visit.    Allergies as of 05/04/2020 - Review Complete 05/04/2020  Allergen Reaction Noted  . Latex Itching 03/13/2013  . Gluten meal Other (See Comments) 03/13/2013    Vitals: BP 114/78 (BP Location: Left Arm, Patient Position: Sitting)   Pulse 65   Ht  (1.499 m)   Wt 140 lb (63.5 kg)   BMI 28.28 kg/m  Last Weight:  Wt Readings from Last 1 Encounters:  05/04/20 140 lb (63.5 kg)   Last Height:   Ht Readings from Last 1 Encounters:  05/04/20 4'  11" (1.499 m)     Physical exam: Exam: Gen: NAD, conversant, well nourised, well groomed                     CV: RRR, no MRG. No Carotid Bruits. No peripheral edema, warm, nontender Eyes: Conjunctivae clear without exudates or hemorrhage  Neuro: Detailed Neurologic Exam  Speech:    Speech is normal; fluent and spontaneous with normal comprehension.  Cognition:    The patient is oriented to person, place, and time;     recent and remote memory intact;     language fluent;     normal attention, concentration,     fund of knowledge Cranial Nerves:    The pupils are equal, round, and reactive to light. The fundi are normal and spontaneous venous pulsations are present. Visual fields are full to finger confrontation. Extraocular movements are intact. Trigeminal sensation is intact and the muscles of mastication are normal. The face is symmetric. The palate elevates in the midline. Hearing intact. Voice is normal. Shoulder shrug is normal. The tongue has normal motion without fasciculations.   Coordination:    Normal finger to nose and heel to shin. Normal rapid alternating movements.   Gait:    Heel-toe and tandem gait are normal.   Motor Observation:    No asymmetry, no atrophy, and no involuntary movements noted. Tone:    Normal muscle tone.    Posture:    Posture is normal. normal erect    Strength:    Strength is V/V in the upper  and lower limbs.      Sensation: intact to LT     Reflex Exam:  DTR's:    Deep tendon reflexes in the upper and lower extremities are normal bilaterally.   Toes:    The toes are downgoing bilaterally.   Clonus:    Clonus is absent.    Assessment/Plan:  44 y.o. female here as requested by emergency room for headaches.  Past medical history migraines, hyperlipidemia, depression, anxiety, ADD.  Episodic migraines: Continue to take Maxalt as needed for episodic migraines.  She feels the gabapentin helps with the migraines as well. Exertional headaches: Appears to be occipital neuralgia, several times during coitus she has had shooting pain coming up in the back of her head to the top, severe, burning, brief, she has C2-C3 degenerative changes with severe foraminal stenosis so this is likely contributing.  CTA of the head and neck as well as CT of the head were normal without aneurysm.  MRI of the cervical spine showed normal cord but multilevel degenerative changes worse at C2-C3 explaining the occipital neuralgic pain.  I advised her to try taking gabapentin 30 to 60 minutes prior to sex.  No orders of the defined types were placed in this encounter.  Meds ordered this encounter  Medications  . gabapentin (NEURONTIN) 300 MG capsule    Sig: Take 1 capsule (300 mg total) by mouth 3 (three) times daily as needed.    Dispense:  270 capsule    Refill:  1    Cc:  Bradd Canary, MD  Naomie Dean, MD  [facility] [address] [place], [place] [address]  Phone [phone] Fax [phone]  I spent 60  minutes of face-to-face and non-face-to-face time with patient on the  1. Bilateral occipital neuralgia: C2/C3 degenerative changes of cervical spine   2. Exertional headache: has happened several time during sex   3. Migraine without aura  and without status migrainosus, not intractable: episodic 2x a month    diagnosis.  This included previsit chart  review, lab review, study review, order entry, electronic health record documentation, patient education on the different diagnostic and therapeutic options, counseling and coordination of care, risks and benefits of management, compliance, or risk factor reduction

## 2020-05-04 NOTE — Patient Instructions (Addendum)
Occipital Neuralgia  Occipital neuralgia is a type of headache that causes brief episodes of very bad pain in the back of your head. Pain from occipital neuralgia may spread (radiate) to other parts of your head. These headaches may be caused by irritation of the nerves that leave your spinal cord high up in your neck, just below the base of your skull (occipital nerves). Your occipital nerves transmit sensations from the back of your head, the top of your head, and the areas behind your ears. What are the causes? This condition can occur without any known cause (primary headache syndrome). In other cases, this condition is caused by pressure on or irritation of one of the two occipital nerves. Pressure and irritation may be due to:  Muscle spasm in the neck.  Neck injury.  Wear and tear of the vertebrae in the neck (osteoarthritis).  Disease of the disks that separate the vertebrae.  Swollen blood vessels that put pressure on the occipital nerves.  Infections.  Tumors.  Diabetes. What are the signs or symptoms? This condition causes brief burning, stabbing, electric, shocking, or shooting pain which can radiate to the top of the head. It can happen on one side or both sides of the head. It can also cause:  Pain behind the eye.  Pain triggered by neck movement or hair brushing.  Scalp tenderness.  Aching in the back of the head between episodes of very bad pain.  Pain gets worse with exposure to bright lights. How is this diagnosed? There is no test that diagnoses this condition. Your health care provider may diagnose this condition based on a physical exam and your symptoms. Other tests may be done, such as:  Imaging studies of the brain and neck (cervical spine), such as an MRI or CT scan. These look for causes of pinched nerves.  Applying pressure to the nerves in the neck to try to re-create the pain.  Injection of numbing medicine into the occipital nerve areas to see if  pain goes away (diagnostic nerve block). How is this treated? Treatment for this condition may begin with simple measures, such as:  Rest.  Massage.  Applying heat or cold on the area.  Over-the-counter pain relievers. If these measures do not work, you may need other treatments, including:  Medicines, such as: ? Prescription-strength anti-inflammatory medicines. ? Muscle relaxants. ? Anti-seizure medicines, which can relieve pain. ? Antidepressants, which can relieve pain. ? Injected medicines, such as medicines that numb the area (local anesthetic) and steroids.  Pulsed radiofrequency ablation. This is when wires are implanted to deliver electrical impulses that block pain signals from the occipital nerve.  Surgery to relieve nerve pressure.  Physical therapy. Follow these instructions at home: Pain management      Avoid any activities that cause pain.  Rest when you have an attack of pain.  Try gentle massage to relieve pain.  Try a different pillow or sleeping position.  If directed, apply heat to the affected area as told by your health care provider. Use the heat source that your health care provider recommends, such as a moist heat pack or a heating pad. ? Place a towel between your skin and the heat source. ? Leave the heat on for 20-30 minutes. ? Remove the heat if your skin turns bright red. This is especially important if you are unable to feel pain, heat, or cold. You may have a greater risk of getting burned.  If directed, apply ice to the   back of the head and neck area as told by your health care provider. ? Put ice in a plastic bag. ? Place a towel between your skin and the bag. ? Leave the ice on for 20 minutes, 2-3 times per day. General instructions  Take over-the-counter and prescription medicines only as told by your health care provider.  Avoid things that make your symptoms worse, such as bright lights.  Try to stay active. Get regular  exercise that does not cause pain. Ask your health care provider to suggest safe exercises for you.  Work with a physical therapist to learn stretching exercises you can do at home.  Practice good posture.  Keep all follow-up visits as told by your health care provider. This is important. Contact a health care provider if:  Your medicine is not working.  You have new or worsening symptoms. Get help right away if:  You have very bad head pain that does not go away.  You have a sudden change in vision, balance, or speech. Summary  Occipital neuralgia is a type of headache that causes brief episodes of very bad pain in the back of your head.  Pain from occipital neuralgia may spread (radiate) to other parts of your head.  Treatment for this condition includes rest, massage, and medicines. This information is not intended to replace advice given to you by your health care provider. Make sure you discuss any questions you have with your health care provider. Document Revised: 08/28/2017 Document Reviewed: 11/16/2016 Elsevier Patient Education  2020 [employer]vier Inc[employer]  

## 2020-06-28 ENCOUNTER — Other Ambulatory Visit: Payer: Self-pay | Admitting: Family Medicine

## 2020-07-02 ENCOUNTER — Encounter: Payer: Self-pay | Admitting: Family Medicine

## 2020-07-02 ENCOUNTER — Other Ambulatory Visit: Payer: Self-pay

## 2020-07-02 ENCOUNTER — Telehealth (INDEPENDENT_AMBULATORY_CARE_PROVIDER_SITE_OTHER): Payer: 59 | Admitting: Family Medicine

## 2020-07-02 VITALS — Ht 59.0 in | Wt 139.0 lb

## 2020-07-02 DIAGNOSIS — E559 Vitamin D deficiency, unspecified: Secondary | ICD-10-CM

## 2020-07-02 DIAGNOSIS — M1990 Unspecified osteoarthritis, unspecified site: Secondary | ICD-10-CM

## 2020-07-02 DIAGNOSIS — R739 Hyperglycemia, unspecified: Secondary | ICD-10-CM | POA: Diagnosis not present

## 2020-07-02 DIAGNOSIS — E782 Mixed hyperlipidemia: Secondary | ICD-10-CM | POA: Diagnosis not present

## 2020-07-02 DIAGNOSIS — G43809 Other migraine, not intractable, without status migrainosus: Secondary | ICD-10-CM | POA: Diagnosis not present

## 2020-07-02 DIAGNOSIS — F418 Other specified anxiety disorders: Secondary | ICD-10-CM

## 2020-07-05 DIAGNOSIS — M1990 Unspecified osteoarthritis, unspecified site: Secondary | ICD-10-CM | POA: Insufficient documentation

## 2020-07-05 NOTE — Assessment & Plan Note (Signed)
Encouraged increased hydration, 64 ounces of clear fluids daily. Minimize alcohol and caffeine. Eat small frequent meals with lean proteins and complex carbs. Avoid high and low blood sugars. Get adequate sleep, 7-8 hours a night. Needs exercise daily preferably in the morning. Is working on her neck due to arthritic changes and hoping it will help her headaches.

## 2020-07-05 NOTE — Assessment & Plan Note (Signed)
Encouraged heart healthy diet, increase exercise, avoid trans fats, consider a krill oil cap daily 

## 2020-07-05 NOTE — Assessment & Plan Note (Signed)
Is using liquid Vitamin D drops. 3 drops daily

## 2020-07-05 NOTE — Assessment & Plan Note (Signed)
She is doing well on Fluoxetine. Does not feel she needs any further adjustments to medications.

## 2020-07-05 NOTE — Assessment & Plan Note (Signed)
She is working with rheumatology and chiropractor to manage her pain. Likely psoriatic arthritis. Is getting some relief from Meloxicam she was given

## 2020-07-05 NOTE — Progress Notes (Signed)
Virtual Visit via Video Note  I connected with Whitney Trujillo on 07/02/20 at 10:00 AM EDT by a video enabled telemedicine application and verified that I am speaking with the correct person using two identifiers.  Location: Patient: home, patient and provider in visit Provider: home   I discussed the limitations of evaluation and management by telemedicine and the availability of in person appointments. The patient expressed understanding and agreed to proceed. Tawni Carnes, CMA was able to get the patient set up on a video visit      Subjective:    Patient ID: Whitney Trujillo, female    DOB: 1975/11/27, 44 y.o.   MRN: [id number]  Chief Complaint  Patient presents with  . followup-medication recheck    HPI Patient is in today for follow up on chronic medical concerns. No recent febrile illness or hospitalizations. She feels her Fluoxetine is working well. Her mood is good and she denies any concerning side effects. She notes persistent trouble with headache, neck pain, joint pains and trouble with rashes at times. Denies CP/palp/SOB/HA/congestion/fevers/GI or GU c/o. Taking meds as prescribed  Past Medical History:  Diagnosis Date  . Acute right lower quadrant pain 01/25/2016  . ADD (attention deficit disorder) 02/02/2015  . Allergy    seasonal allergies  . Anxiety   . Depression with anxiety 03/15/2016  . Endometriosis   . Gallstones   . Hyperlipidemia, mixed 02/02/2015  . Migraine 12/16/2015  . Preventative health care 12/16/2015  . Pulmonary emboli (HCC)    b/l PE after hysterectomy, 5 weeks post op, clotting problem ruled out at [facility]    Past Surgical History:  Procedure Laterality Date  . ABDOMINAL HYSTERECTOMY     ovaries removed, in 2 separate procedures  . CESAREAN SECTION  2007, 2009  . CHOLECYSTECTOMY    . CYSTOSCOPY  10/2014  . EYE SURGERY     lasik b/l  . LAPAROTOMY     Endometriosis  . WISDOM TOOTH EXTRACTION      Family History  Problem Relation Age  of Onset  . Cancer Father        Carcinoid Syndrome, colon  . Colon cancer Father   . Headache Father   . Diabetes Mother        2, diet controlled  . ADD / ADHD Son   . Heart disease Maternal Grandmother   . Stroke Paternal Grandmother   . Dementia Paternal Grandmother   . COPD Paternal Grandfather        black lun, Forensic scientist, tobacco  . Ovarian cancer Maternal Aunt     Social History   Socioeconomic History  . Marital status: Married    Spouse name: Not on file  . Number of children: Not on file  . Years of education: Not on file  . Highest education level: Not on file  Occupational History  . Not on file  Tobacco Use  . Smoking status: Never Smoker  . Smokeless tobacco: Never Used  Vaping Use  . Vaping Use: Never used  Substance and Sexual Activity  . Alcohol use: Yes    Comment: Socially  . Drug use: No  . 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 Not on file  Social History Narrative   Lives at home with husband and 2 children  Right handed   Caffeine: 2-3 cups/day   Social Determinants of Health   Financial Resource Strain:   . Difficulty of Paying Living Expenses: Not on file  Food Insecurity:   . Worried About Programme researcher, broadcasting/film/video in the Last Year: Not on file  . Ran Out of Food in the Last Year: Not on file  Transportation Needs:   . Lack of Transportation (Medical): Not on file  . Lack of Transportation (Non-Medical): Not on file  Physical Activity:   . Days of Exercise per Week: Not on file  . Minutes of Exercise per Session: Not on file  Stress:   . Feeling of Stress : Not on file  Social Connections:   . Frequency of Communication with Friends and Family: Not on file  . Frequency of Social Gatherings with Friends and Family: Not on file  . Attends Religious Services: Not on file  .  Active Member of Clubs or Organizations: Not on file  . Attends Banker Meetings: Not on file  . Marital Status: Not on file  Intimate Partner Violence:   . Fear of Current or Ex-Partner: Not on file  . Emotionally Abused: Not on file  . Physically Abused: Not on file  . Sexually Abused: Not on file    Outpatient Medications Prior to Visit  Medication Sig Dispense Refill  . B Complex Vitamins (B COMPLEX PO) Take by mouth.    Marland Kitchen FLUoxetine (PROZAC) 20 MG capsule TAKE ONE CAPSULE BY MOUTH DAILY 30 capsule 0  . gabapentin (NEURONTIN) 300 MG capsule Take 1 capsule (300 mg total) by mouth 3 (three) times daily as needed. 270 capsule 1  . meloxicam (MOBIC) 15 MG tablet Take 15 mg by mouth daily.    . Multiple Vitamins-Minerals (ZINC PO) Take by mouth.    . Omega-3 1000 MG CAPS Take by mouth.    . rizatriptan (MAXALT) 10 MG tablet Take 1 tablet (10 mg total) by mouth as needed for migraine. May repeat in 2 hours if needed Failed Imitrex with side effects 10 tablet 5  . UNABLE TO FIND Med Name: hormone inserts surgically implanted in hip    . VITAMIN D PO Take by mouth. Liquid     No facility-administered medications prior to visit.    Allergies  Allergen Reactions  . Latex Itching  . Gluten Meal Other (See Comments)    Cramping and diarrhea    Review of Systems  Constitutional: Negative for fever and malaise/fatigue.  HENT: Negative for congestion.   Eyes: Negative for blurred vision.  Respiratory: Negative for shortness of breath.   Cardiovascular: Negative for chest pain, palpitations and leg swelling.  Gastrointestinal: Negative for abdominal pain, blood in stool and nausea.  Genitourinary: Negative for dysuria and frequency.  Musculoskeletal: Positive for joint pain, myalgias and neck pain. Negative for falls.  Skin: Positive for rash.  Neurological: Positive for headaches. Negative for dizziness and loss of consciousness.  Endo/Heme/Allergies: Negative for  environmental allergies.  Psychiatric/Behavioral: Negative for depression. The patient is not nervous/anxious.        Objective:    Physical Exam Constitutional:      Appearance: Normal appearance. She is not ill-appearing.  HENT:     Head: Normocephalic and atraumatic.     Right Ear: External ear normal.     Left Ear: External ear normal.     Nose: Nose normal.  Eyes:     General:        Right  eye: No discharge.        Left eye: No discharge.  Pulmonary:     Effort: Pulmonary effort is normal.  Neurological:     Mental Status: She is alert and oriented to person, place, and time.  Psychiatric:        Behavior: Behavior normal.     Ht 4\' 11"  (1.499 m)   Wt 139 lb (63 kg)   BMI 28.07 kg/m  Wt Readings from Last 3 Encounters:  07/02/20 139 lb (63 kg)  05/04/20 140 lb (63.5 kg)  03/07/20 139 lb 4 oz (63.2 kg)    Diabetic Foot Exam - Simple   No data filed     Lab Results  Component Value Date   WBC 7.4 03/07/2020   HGB 15.2 (H) 03/07/2020   HCT 45.2 03/07/2020   PLT 271 03/07/2020   GLUCOSE 109 (H) 03/07/2020   CHOL 258 (H) 11/04/2019   TRIG 179.0 (H) 11/04/2019   HDL 49.70 11/04/2019   LDLCALC 173 (H) 11/04/2019   ALT 23 11/04/2019   AST 20 11/04/2019   NA 139 03/07/2020   K 3.4 (L) 03/07/2020   CL 102 03/07/2020   CREATININE 0.80 03/07/2020   BUN 10 03/07/2020   CO2 25 03/07/2020   TSH 1.56 11/04/2019    Lab Results  Component Value Date   TSH 1.56 11/04/2019   Lab Results  Component Value Date   WBC 7.4 03/07/2020   HGB 15.2 (H) 03/07/2020   HCT 45.2 03/07/2020   MCV 87.3 03/07/2020   PLT 271 03/07/2020   Lab Results  Component Value Date   NA 139 03/07/2020   K 3.4 (L) 03/07/2020   CO2 25 03/07/2020   GLUCOSE 109 (H) 03/07/2020   BUN 10 03/07/2020   CREATININE 0.80 03/07/2020   BILITOT 1.1 11/04/2019   ALKPHOS 48 11/04/2019   AST 20 11/04/2019   ALT 23 11/04/2019   PROT 6.9 11/04/2019   ALBUMIN 4.5 11/04/2019   CALCIUM 9.1  03/07/2020   ANIONGAP 12 03/07/2020   GFR 75.28 11/04/2019   Lab Results  Component Value Date   CHOL 258 (H) 11/04/2019   Lab Results  Component Value Date   HDL 49.70 11/04/2019   Lab Results  Component Value Date   LDLCALC 173 (H) 11/04/2019   Lab Results  Component Value Date   TRIG 179.0 (H) 11/04/2019   Lab Results  Component Value Date   CHOLHDL 5 11/04/2019   No results found for: HGBA1C     Assessment & Plan:   Problem List Items Addressed This Visit    Hyperlipidemia, mixed    Encouraged heart healthy diet, increase exercise, avoid trans fats, consider a krill oil cap daily      Relevant Orders   Lipid panel   TSH   Vitamin D deficiency - Primary    Is using liquid Vitamin D drops. 3 drops daily      Relevant Orders   VITAMIN D 25 Hydroxy (Vit-D Deficiency, Fractures)   Migraine    Encouraged increased hydration, 64 ounces of clear fluids daily. Minimize alcohol and caffeine. Eat small frequent meals with lean proteins and complex carbs. Avoid high and low blood sugars. Get adequate sleep, 7-8 hours a night. Needs exercise daily preferably in the morning. Is working on her neck due to arthritic changes and hoping it will help her headaches.       Relevant Medications   meloxicam (MOBIC) 15 MG tablet  Other Relevant Orders   CBC with Differential/Platelet   TSH   Depression with anxiety    She is doing well on Fluoxetine. Does not feel she needs any further adjustments to medications.       Arthritis    She is working with rheumatology and chiropractor to manage her pain. Likely psoriatic arthritis. Is getting some relief from Meloxicam she was given      Relevant Medications   meloxicam (MOBIC) 15 MG tablet    Other Visit Diagnoses    Hyperglycemia       Relevant Orders   Hemoglobin A1c   Comprehensive metabolic panel   TSH      I am having Meila A. Gillispie "[name]" maintain her rizatriptan, Omega-3, VITAMIN D PO, B Complex Vitamins  (B COMPLEX PO), Multiple Vitamins-Minerals (ZINC PO), UNABLE TO FIND, gabapentin, FLUoxetine, and meloxicam.  No orders of the defined types were placed in this encounter.  I discussed the assessment and treatment plan with the patient. The patient was provided an opportunity to ask questions and all were answered. The patient agreed with the plan and demonstrated an understanding of the instructions.   The patient was advised to call back or seek an in-person evaluation if the symptoms worsen or if the condition fails to improve as anticipated.  I provided 20 minutes of non-face-to-face time during this encounter.   Danise EdgeStacey Brannan Cassedy, MD

## 2020-07-22 ENCOUNTER — Other Ambulatory Visit (INDEPENDENT_AMBULATORY_CARE_PROVIDER_SITE_OTHER): Payer: 59

## 2020-07-22 ENCOUNTER — Other Ambulatory Visit: Payer: Self-pay

## 2020-07-22 DIAGNOSIS — R739 Hyperglycemia, unspecified: Secondary | ICD-10-CM

## 2020-07-22 DIAGNOSIS — E559 Vitamin D deficiency, unspecified: Secondary | ICD-10-CM | POA: Diagnosis not present

## 2020-07-22 DIAGNOSIS — G43809 Other migraine, not intractable, without status migrainosus: Secondary | ICD-10-CM

## 2020-07-22 DIAGNOSIS — E782 Mixed hyperlipidemia: Secondary | ICD-10-CM

## 2020-07-23 LAB — CBC WITH DIFFERENTIAL/PLATELET
Absolute Monocytes: 424 cells/uL (ref 200–950)
Basophils Absolute: 32 cells/uL (ref 0–200)
Basophils Relative: 0.6 %
Eosinophils Absolute: 90 cells/uL (ref 15–500)
Eosinophils Relative: 1.7 %
HCT: 44.7 % (ref 35.0–45.0)
Hemoglobin: 14.8 g/dL (ref 11.7–15.5)
Lymphs Abs: 2083 cells/uL (ref 850–3900)
MCH: 29.1 pg (ref 27.0–33.0)
MCHC: 33.1 g/dL (ref 32.0–36.0)
MCV: 87.8 fL (ref 80.0–100.0)
MPV: 9.9 fL (ref 7.5–12.5)
Monocytes Relative: 8 %
Neutro Abs: 2671 cells/uL (ref 1500–7800)
Neutrophils Relative %: 50.4 %
Platelets: 286 10*3/uL (ref 140–400)
RBC: 5.09 10*6/uL (ref 3.80–5.10)
RDW: 12.3 % (ref 11.0–15.0)
Total Lymphocyte: 39.3 %
WBC: 5.3 10*3/uL (ref 3.8–10.8)

## 2020-07-23 LAB — COMPREHENSIVE METABOLIC PANEL
AG Ratio: 1.8 (calc) (ref 1.0–2.5)
ALT: 18 U/L (ref 6–29)
AST: 16 U/L (ref 10–35)
Albumin: 4.4 g/dL (ref 3.6–5.1)
Alkaline phosphatase (APISO): 40 U/L (ref 31–125)
BUN: 20 mg/dL (ref 7–25)
CO2: 28 mmol/L (ref 20–32)
Calcium: 9.7 mg/dL (ref 8.6–10.2)
Chloride: 102 mmol/L (ref 98–110)
Creat: 0.95 mg/dL (ref 0.50–1.10)
Globulin: 2.4 g/dL (calc) (ref 1.9–3.7)
Glucose, Bld: 96 mg/dL (ref 65–99)
Potassium: 4.6 mmol/L (ref 3.5–5.3)
Sodium: 138 mmol/L (ref 135–146)
Total Bilirubin: 1.2 mg/dL (ref 0.2–1.2)
Total Protein: 6.8 g/dL (ref 6.1–8.1)

## 2020-07-23 LAB — LIPID PANEL
Cholesterol: 256 mg/dL — ABNORMAL HIGH (ref <200)
HDL: 53 mg/dL (ref >=50)
LDL Cholesterol (Calc): 176 mg/dL (calc) — ABNORMAL HIGH
Non-HDL Cholesterol (Calc): 203 mg/dL (calc) — ABNORMAL HIGH (ref <130)
Total CHOL/HDL Ratio: 4.8 (calc) (ref <5.0)
Triglycerides: 131 mg/dL (ref <150)

## 2020-07-23 LAB — VITAMIN D 25 HYDROXY (VIT D DEFICIENCY, FRACTURES): Vit D, 25-Hydroxy: 100 ng/mL (ref 30–100)

## 2020-07-23 LAB — TSH: TSH: 1.34 mIU/L

## 2020-07-23 LAB — HEMOGLOBIN A1C
Hgb A1c MFr Bld: 5.5 % of total Hgb (ref <5.7)
Mean Plasma Glucose: 111 (calc)
eAG (mmol/L): 6.2 (calc)

## 2020-07-28 ENCOUNTER — Other Ambulatory Visit: Payer: Self-pay | Admitting: Family Medicine

## 2020-07-28 ENCOUNTER — Other Ambulatory Visit: Payer: Self-pay

## 2020-09-13 ENCOUNTER — Other Ambulatory Visit: Payer: Self-pay | Admitting: Family Medicine

## 2020-09-13 ENCOUNTER — Encounter: Payer: Self-pay | Admitting: Family Medicine

## 2020-09-13 DIAGNOSIS — R21 Rash and other nonspecific skin eruption: Secondary | ICD-10-CM

## 2020-10-14 ENCOUNTER — Telehealth: Payer: Self-pay | Admitting: Family Medicine

## 2020-10-14 MED ORDER — FLUOXETINE HCL 20 MG PO CAPS: 20.0000 mg | ORAL_CAPSULE | Freq: Every day | ORAL | 0 refills | Status: DC

## 2020-10-14 NOTE — Telephone Encounter (Signed)
Refills have been sent.  

## 2020-10-14 NOTE — Telephone Encounter (Signed)
Medication: FLUoxetine (PROZAC) 20 MG capsule [[id number]]    Has the patient contacted their pharmacy? No. (If no, request that the patient contact the pharmacy for the refill.) (If yes, when and what did the pharmacy advise?)  Preferred Pharmacy (with phone number or street name) [facility] [address] - [place], [place] - [address]  [address], [place] [address]  Phone:  [phone] Fax:  [phone]  DEA #:  --  DAW Reason: --     Agent: Please be advised that RX refills may take up to 3 business days. We ask that you follow-up with your pharmacy.

## 2020-10-26 ENCOUNTER — Other Ambulatory Visit: Payer: Self-pay | Admitting: Family Medicine

## 2020-11-04 ENCOUNTER — Encounter: Payer: 59 | Admitting: Family Medicine

## 2020-11-09 ENCOUNTER — Encounter: Payer: Self-pay | Admitting: Family Medicine

## 2020-11-09 ENCOUNTER — Other Ambulatory Visit: Payer: Self-pay

## 2020-11-09 ENCOUNTER — Ambulatory Visit (INDEPENDENT_AMBULATORY_CARE_PROVIDER_SITE_OTHER): Payer: 59 | Admitting: Family Medicine

## 2020-11-09 VITALS — BP 114/66 | HR 78 | Temp 97.9°F | Resp 16 | Ht 59.0 in | Wt 136.2 lb

## 2020-11-09 DIAGNOSIS — Z7289 Other problems related to lifestyle: Secondary | ICD-10-CM

## 2020-11-09 DIAGNOSIS — Z0000 Encounter for general adult medical examination without abnormal findings: Secondary | ICD-10-CM

## 2020-11-09 DIAGNOSIS — R739 Hyperglycemia, unspecified: Secondary | ICD-10-CM | POA: Diagnosis not present

## 2020-11-09 DIAGNOSIS — E559 Vitamin D deficiency, unspecified: Secondary | ICD-10-CM | POA: Diagnosis not present

## 2020-11-09 DIAGNOSIS — L409 Psoriasis, unspecified: Secondary | ICD-10-CM | POA: Diagnosis not present

## 2020-11-09 DIAGNOSIS — E782 Mixed hyperlipidemia: Secondary | ICD-10-CM

## 2020-11-09 DIAGNOSIS — F418 Other specified anxiety disorders: Secondary | ICD-10-CM

## 2020-11-09 NOTE — Patient Instructions (Signed)
Preventive Care 84-47 Years Old, Female Preventive care refers to lifestyle choices and visits with your health care provider that can promote health and wellness. This includes:  A yearly physical exam. This is also called an annual wellness visit.  Regular dental and eye exams.  Immunizations.  Screening for certain conditions.  Healthy lifestyle choices, such as: ? Eating a healthy diet. ? Getting regular exercise. ? Not using drugs or products that contain nicotine and tobacco. ? Limiting alcohol use. What can I expect for my preventive care visit? Physical exam Your health care provider will check your:  Height and weight. These may be used to calculate your BMI (body mass index). BMI is a measurement that tells if you are at a healthy weight.  Heart rate and blood pressure.  Body temperature.  Skin for abnormal spots. Counseling Your health care provider may ask you questions about your:  Past medical problems.  Family's medical history.  Alcohol, tobacco, and drug use.  Emotional well-being.  Home life and relationship well-being.  Sexual activity.  Diet, exercise, and sleep habits.  Work and work Statistician.  Access to firearms.  Method of birth control.  Menstrual cycle.  Pregnancy history. What immunizations do I need? Vaccines are usually given at various ages, according to a schedule. Your health care provider will recommend vaccines for you based on your age, medical history, and lifestyle or other factors, such as travel or where you work.   What tests do I need? Blood tests  Lipid and cholesterol levels. These may be checked every 5 years, or more often if you are over 3 years old.  Hepatitis C test.  Hepatitis B test. Screening  Lung cancer screening. You may have this screening every year starting at age 73 if you have a 30-pack-year history of smoking and currently smoke or have quit within the past 15 years.  Colorectal cancer  screening. ? All adults should have this screening starting at age 52 and continuing until age 17. ? Your health care provider may recommend screening at age 49 if you are at increased risk. ? You will have tests every 1-10 years, depending on your results and the type of screening test.  Diabetes screening. ? This is done by checking your blood sugar (glucose) after you have not eaten for a while (fasting). ? You may have this done every 1-3 years.  Mammogram. ? This may be done every 1-2 years. ? Talk with your health care provider about when you should start having regular mammograms. This may depend on whether you have a family history of breast cancer.  BRCA-related cancer screening. This may be done if you have a family history of breast, ovarian, tubal, or peritoneal cancers.  Pelvic exam and Pap test. ? This may be done every 3 years starting at age 10. ? Starting at age 11, this may be done every 5 years if you have a Pap test in combination with an HPV test. Other tests  STD (sexually transmitted disease) testing, if you are at risk.  Bone density scan. This is done to screen for osteoporosis. You may have this scan if you are at high risk for osteoporosis. Talk with your health care provider about your test results, treatment options, and if necessary, the need for more tests. Follow these instructions at home: Eating and drinking  Eat a diet that includes fresh fruits and vegetables, whole grains, lean protein, and low-fat dairy products.  Take vitamin and mineral supplements  as recommended by your health care provider.  Do not drink alcohol if: ? Your health care provider tells you not to drink. ? You are pregnant, may be pregnant, or are planning to become pregnant.  If you drink alcohol: ? Limit how much you have to 0-1 drink a day. ? Be aware of how much alcohol is in your drink. In the [place] one drink equals one 12 oz bottle of beer (355 mL), one 5 oz glass of  wine (148 mL), or one 1 oz glass of hard liquor (44 mL).   Lifestyle  Take daily care of your teeth and gums. Brush your teeth every morning and night with fluoride toothpaste. Floss one time each day.  Stay active. Exercise for at least 30 minutes 5 or more days each week.  Do not use any products that contain nicotine or tobacco, such as cigarettes, e-cigarettes, and chewing tobacco. If you need help quitting, ask your health care provider.  Do not use drugs.  If you are sexually active, practice safe sex. Use a condom or other form of protection to prevent STIs (sexually transmitted infections).  If you do not wish to become pregnant, use a form of birth control. If you plan to become pregnant, see your health care provider for a prepregnancy visit.  If told by your health care provider, take low-dose aspirin daily starting at age 50.  Find healthy ways to cope with stress, such as: ? Meditation, yoga, or listening to music. ? Journaling. ? Talking to a trusted person. ? Spending time with friends and family. Safety  Always wear your seat belt while driving or riding in a vehicle.  Do not drive: ? If you have been drinking alcohol. Do not ride with someone who has been drinking. ? When you are tired or distracted. ? While texting.  Wear a helmet and other protective equipment during sports activities.  If you have firearms in your house, make sure you follow all gun safety procedures. What's next?  Visit your health care provider once a year for an annual wellness visit.  Ask your health care provider how often you should have your eyes and teeth checked.  Stay up to date on all vaccines. This information is not intended to replace advice given to you by your health care provider. Make sure you discuss any questions you have with your health care provider. Document Revised: 06/15/2020 Document Reviewed: 05/23/2018 Elsevier Patient Education  2021 Elsevier Inc.  

## 2020-11-09 NOTE — Assessment & Plan Note (Addendum)
Is seeing dermatology had a cortisone injection and topical steroids and while her skin is better it is not completely clear. She has folllow up a ppt with them soon.

## 2020-11-10 ENCOUNTER — Telehealth: Payer: Self-pay

## 2020-11-10 DIAGNOSIS — R739 Hyperglycemia, unspecified: Secondary | ICD-10-CM | POA: Insufficient documentation

## 2020-11-10 LAB — COMPREHENSIVE METABOLIC PANEL
ALT: 16 U/L (ref 0–35)
AST: 14 U/L (ref 0–37)
Albumin: 4.5 g/dL (ref 3.5–5.2)
Alkaline Phosphatase: 31 U/L — ABNORMAL LOW (ref 39–117)
BUN: 15 mg/dL (ref 6–23)
CO2: 30 mEq/L (ref 19–32)
Calcium: 9.5 mg/dL (ref 8.4–10.5)
Chloride: 100 mEq/L (ref 96–112)
Creatinine, Ser: 0.85 mg/dL (ref 0.40–1.20)
GFR: 82.2 mL/min (ref >60.00)
Glucose, Bld: 82 mg/dL (ref 70–99)
Potassium: 4.1 mEq/L (ref 3.5–5.1)
Sodium: 138 mEq/L (ref 135–145)
Total Bilirubin: 1.2 mg/dL (ref 0.2–1.2)
Total Protein: 7.1 g/dL (ref 6.0–8.3)

## 2020-11-10 LAB — CBC
HCT: 43.2 % (ref 36.0–46.0)
Hemoglobin: 14.5 g/dL (ref 12.0–15.0)
MCHC: 33.5 g/dL (ref 30.0–36.0)
MCV: 89.4 fl (ref 78.0–100.0)
Platelets: 277 10*3/uL (ref 150.0–400.0)
RBC: 4.84 Mil/uL (ref 3.87–5.11)
RDW: 13.2 % (ref 11.5–15.5)
WBC: 5.8 10*3/uL (ref 4.0–10.5)

## 2020-11-10 LAB — LIPID PANEL
Cholesterol: 249 mg/dL — ABNORMAL HIGH (ref 0–200)
HDL: 60.6 mg/dL (ref >39.00)
LDL Cholesterol: 161 mg/dL — ABNORMAL HIGH (ref 0–99)
NonHDL: 188.42
Total CHOL/HDL Ratio: 4
Triglycerides: 135 mg/dL (ref 0.0–149.0)
VLDL: 27 mg/dL (ref 0.0–40.0)

## 2020-11-10 LAB — HEPATITIS C ANTIBODY
Hepatitis C Ab: NONREACTIVE
SIGNAL TO CUT-OFF: 0.01 (ref <1.00)

## 2020-11-10 LAB — HEMOGLOBIN A1C: Hgb A1c MFr Bld: 5.5 % (ref 4.6–6.5)

## 2020-11-10 LAB — TSH: TSH: 1.3 u[IU]/mL (ref 0.35–4.50)

## 2020-11-10 LAB — VITAMIN D 25 HYDROXY (VIT D DEFICIENCY, FRACTURES): VITD: >120 ng/mL

## 2020-11-10 NOTE — Telephone Encounter (Signed)
CRITICAL VALUE STICKER  CRITICAL VALUE: Vitamin D greater than 120  RECEIVER (on-site recipient of call): Ikeisha Blumberg  DATE & TIME NOTIFIED: 11/10/2020 at 11:40 am MESSENGER (representative from lab): Henderson Cloud  MD NOTIFIED: Abner Greenspan and Drue Novel  TIME OF NOTIFICATION:11:42 am  RESPONSE:

## 2020-11-10 NOTE — Telephone Encounter (Signed)
LMOM informing Pt of recommendations. Instructed to call if questions/concerns.  

## 2020-11-10 NOTE — Assessment & Plan Note (Signed)
Patient encouraged to maintain heart healthy diet, regular exercise, adequate sleep. Consider daily probiotics. Take medications as prescribed. Last [facility] 12/15/19 repeast next month

## 2020-11-10 NOTE — Assessment & Plan Note (Signed)
Supplement and monitor 

## 2020-11-10 NOTE — Telephone Encounter (Signed)
Advise patient to decrease vitamin D supplements to half of what she is currently doing. Further advised per PCP

## 2020-11-10 NOTE — Assessment & Plan Note (Signed)
Doing well currently, no changes.

## 2020-11-10 NOTE — Assessment & Plan Note (Signed)
Encouraged heart healthy diet, increase exercise, avoid trans fats, consider a krill oil cap daily 

## 2020-11-10 NOTE — Progress Notes (Signed)
Subjective:    Patient ID: Whitney Trujillo, female    DOB: 1975-10-25, 45 y.o.   MRN: [id number]  Chief Complaint  Patient presents with  . Annual Exam    HPI Patient is in today for annual preventative exam and follow up on chronic medical concerns. She is doing well. No recent febrile illness or hospitalizations. She is staying active and exercising. She is maintaining a heart healthy diet. She has no acute concerns. Her skin is improving on current meds but her rash is not completely resolved. Denies CP/palp/SOB/HA/congestion/fevers/GI or GU c/o. Taking meds as prescribed. No polyuria or polydipsia.   Past Medical History:  Diagnosis Date  . Acute right lower quadrant pain 01/25/2016  . ADD (attention deficit disorder) 02/02/2015  . Allergy    seasonal allergies  . Anxiety   . Depression with anxiety 03/15/2016  . Endometriosis   . Gallstones   . Hyperlipidemia, mixed 02/02/2015  . Migraine 12/16/2015  . Preventative health care 12/16/2015  . Pulmonary emboli (HCC)    b/l PE after hysterectomy, 5 weeks post op, clotting problem ruled out at [facility]    Past Surgical History:  Procedure Laterality Date  . ABDOMINAL HYSTERECTOMY     ovaries removed, in 2 separate procedures  . CESAREAN SECTION  2007, 2009  . CHOLECYSTECTOMY    . CYSTOSCOPY  10/2014  . EYE SURGERY     lasik b/l  . LAPAROTOMY     Endometriosis  . WISDOM TOOTH EXTRACTION      Family History  Problem Relation Age of Onset  . Cancer Father        Carcinoid Syndrome, colon  . Colon cancer Father   . Headache Father   . Diabetes Mother        2, diet controlled  . ADD / ADHD Son   . Heart disease Maternal Grandmother   . Stroke Paternal Grandmother   . Dementia Paternal Grandmother   . COPD Paternal Grandfather        black lun, Forensic scientist, tobacco  . Ovarian cancer Maternal Aunt     Social History   Socioeconomic History  . Marital status: Married    Spouse name: Not on file  . Number of  children: Not on file  . Years of education: Not on file  . Highest education level: Not on file  Occupational History  . Not on file  Tobacco Use  . Smoking status: Never Smoker  . Smokeless tobacco: Never Used  Vaping Use  . Vaping Use: Never used  Substance and Sexual Activity  . Alcohol use: Yes    Comment: Socially  . Drug use: No  . 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 Not on file  Social History Narrative   Lives at home with husband and 2 children   Right handed   Caffeine: 2-3 cups/day   Social Determinants of Health   Financial Resource Strain: Not on file  Food Insecurity: Not on file  Transportation Needs: Not on file  Physical Activity: Not on file  Stress: Not on file  Social Connections: Not on file  Intimate Partner Violence: Not on file    Outpatient Medications Prior to Visit  Medication Sig Dispense Refill  . FLUoxetine (PROZAC) 20 MG capsule Take 1 capsule (20 mg total)  by mouth daily. 90 capsule 0  . gabapentin (NEURONTIN) 300 MG capsule Take 1 capsule (300 mg total) by mouth 3 (three) times daily as needed. 270 capsule 1  . meloxicam (MOBIC) 15 MG tablet Take 15 mg by mouth daily.    . Multiple Vitamins-Minerals (ZINC PO) Take by mouth.    . Omega-3 1000 MG CAPS Take by mouth.    . rizatriptan (MAXALT) 10 MG tablet Take 1 tablet (10 mg total) by mouth as needed for migraine. May repeat in 2 hours if needed Failed Imitrex with side effects 10 tablet 5  . UNABLE TO FIND Med Name: hormone inserts surgically implanted in hip    . VITAMIN D PO Take by mouth. Liquid    . B Complex Vitamins (B COMPLEX PO) Take by mouth.     No facility-administered medications prior to visit.    Allergies  Allergen Reactions  . Latex Itching  . Gluten Meal Other (See Comments)    Cramping and diarrhea     Review of Systems  Constitutional: Negative for chills, fever and malaise/fatigue.  HENT: Negative for congestion and hearing loss.   Eyes: Negative for discharge.  Respiratory: Negative for cough, sputum production and shortness of breath.   Cardiovascular: Negative for chest pain, palpitations and leg swelling.  Gastrointestinal: Negative for abdominal pain, blood in stool, constipation, diarrhea, heartburn, nausea and vomiting.  Genitourinary: Negative for dysuria, frequency, hematuria and urgency.  Musculoskeletal: Negative for back pain, falls and myalgias.  Skin: Negative for rash.  Neurological: Negative for dizziness, sensory change, loss of consciousness, weakness and headaches.  Endo/Heme/Allergies: Negative for environmental allergies. Does not bruise/bleed easily.  Psychiatric/Behavioral: Negative for depression and suicidal ideas. The patient is not nervous/anxious and does not have insomnia.        Objective:    Physical Exam Constitutional:      General: She is not in acute distress.    Appearance: She is not diaphoretic.  HENT:     Head: Normocephalic and atraumatic.     Right Ear: External ear normal.     Left Ear: External ear normal.     Nose: Nose normal.     Mouth/Throat:     Mouth: Oropharynx is clear and moist.     Pharynx: No oropharyngeal exudate.  Eyes:     General: No scleral icterus.       Right eye: No discharge.        Left eye: No discharge.     Conjunctiva/sclera: Conjunctivae normal.     Pupils: Pupils are equal, round, and reactive to light.  Neck:     Thyroid: No thyromegaly.  Cardiovascular:     Rate and Rhythm: Normal rate and regular rhythm.     Pulses: Intact distal pulses.     Heart sounds: Normal heart sounds. No murmur heard.   Pulmonary:     Effort: Pulmonary effort is normal. No respiratory distress.     Breath sounds: Normal breath sounds. No wheezing or rales.  Abdominal:     General: Bowel sounds are normal. There is  no distension.     Palpations: Abdomen is soft. There is no mass.     Tenderness: There is no abdominal tenderness.  Musculoskeletal:        General: No tenderness or edema. Normal range of motion.     Cervical back: Normal range of motion and neck supple.  Lymphadenopathy:     Cervical: No cervical adenopathy.  Skin:  General: Skin is warm and dry.     Findings: No rash.  Neurological:     Mental Status: She is alert and oriented to person, place, and time.     Cranial Nerves: No cranial nerve deficit.     Coordination: Coordination normal.     Deep Tendon Reflexes: Reflexes are normal and symmetric. Reflexes normal.     BP 114/66   Pulse 78   Temp 97.9 F (36.6 C)   Resp 16   Ht 4\' 11"  (1.499 m)   Wt 136 lb 3.2 oz (61.8 kg)   SpO2 97%   BMI 27.51 kg/m  Wt Readings from Last 3 Encounters:  11/09/20 136 lb 3.2 oz (61.8 kg)  07/02/20 139 lb (63 kg)  05/04/20 140 lb (63.5 kg)    Diabetic Foot Exam - Simple   No data filed    Lab Results  Component Value Date   WBC 5.8 11/09/2020   HGB 14.5 11/09/2020   HCT 43.2 11/09/2020   PLT 277.0 11/09/2020   GLUCOSE 82 11/09/2020   CHOL 249 (H) 11/09/2020   TRIG 135.0 11/09/2020   HDL 60.60 11/09/2020   LDLCALC 161 (H) 11/09/2020   ALT 16 11/09/2020   AST 14 11/09/2020   NA 138 11/09/2020   K 4.1 11/09/2020   CL 100 11/09/2020   CREATININE 0.85 11/09/2020   BUN 15 11/09/2020   CO2 30 11/09/2020   TSH 1.30 11/09/2020   HGBA1C 5.5 11/09/2020    Lab Results  Component Value Date   TSH 1.30 11/09/2020   Lab Results  Component Value Date   WBC 5.8 11/09/2020   HGB 14.5 11/09/2020   HCT 43.2 11/09/2020   MCV 89.4 11/09/2020   PLT 277.0 11/09/2020   Lab Results  Component Value Date   NA 138 11/09/2020   K 4.1 11/09/2020   CO2 30 11/09/2020   GLUCOSE 82 11/09/2020   BUN 15 11/09/2020   CREATININE 0.85 11/09/2020   BILITOT 1.2 11/09/2020   ALKPHOS 31 (L) 11/09/2020   AST 14 11/09/2020   ALT 16  11/09/2020   PROT 7.1 11/09/2020   ALBUMIN 4.5 11/09/2020   CALCIUM 9.5 11/09/2020   ANIONGAP 12 03/07/2020   GFR 82.20 11/09/2020   Lab Results  Component Value Date   CHOL 249 (H) 11/09/2020   Lab Results  Component Value Date   HDL 60.60 11/09/2020   Lab Results  Component Value Date   LDLCALC 161 (H) 11/09/2020   Lab Results  Component Value Date   TRIG 135.0 11/09/2020   Lab Results  Component Value Date   CHOLHDL 4 11/09/2020   Lab Results  Component Value Date   HGBA1C 5.5 11/09/2020       Assessment & Plan:   Problem List Items Addressed This Visit    Hyperlipidemia, mixed    Encouraged heart healthy diet, increase exercise, avoid trans fats, consider a krill oil cap daily      Relevant Orders   Lipid panel (Completed)   Vitamin D deficiency - Primary    Supplement and monitor      Relevant Orders   VITAMIN D 25 Hydroxy (Vit-D Deficiency, Fractures) (Completed)   Preventative health care    Patient encouraged to maintain heart healthy diet, regular exercise, adequate sleep. Consider daily probiotics. Take medications as prescribed. Last [facility] 12/15/19 repeast next month      Relevant Orders   CBC (Completed)   TSH (Completed)   Depression with anxiety  Doing well currently, no changes.       Psoriasis    Is seeing dermatology had a cortisone injection and topical steroids and while her skin is better it is not completely clear. She has folllow up a ppt with them soon.       Relevant Orders   CBC (Completed)   Hyperglycemia    hgba1c acceptable, minimize simple carbs. Increase exercise as tolerated.      Relevant Orders   Hemoglobin A1c (Completed)   Comprehensive metabolic panel (Completed)   TSH (Completed)    Other Visit Diagnoses    Other problems related to lifestyle       Relevant Orders   Hepatitis C antibody (Completed)      I am having Finnleigh A. Griffiths "[name]" maintain her rizatriptan, Omega-3, VITAMIN D PO, B Complex  Vitamins (B COMPLEX PO), Multiple Vitamins-Minerals (ZINC PO), UNABLE TO FIND, gabapentin, meloxicam, and FLUoxetine.  No orders of the defined types were placed in this encounter.    Danise Edge, MD

## 2020-11-10 NOTE — Assessment & Plan Note (Signed)
hgba1c acceptable, minimize simple carbs. Increase exercise as tolerated.  

## 2020-12-16 ENCOUNTER — Other Ambulatory Visit: Payer: Self-pay

## 2020-12-16 ENCOUNTER — Encounter: Payer: Self-pay | Admitting: Internal Medicine

## 2020-12-16 ENCOUNTER — Ambulatory Visit: Payer: 59 | Admitting: Internal Medicine

## 2020-12-16 VITALS — BP 132/80 | HR 62 | Temp 98.0°F | Resp 16 | Ht 59.0 in | Wt 131.5 lb

## 2020-12-16 DIAGNOSIS — R5383 Other fatigue: Secondary | ICD-10-CM | POA: Diagnosis not present

## 2020-12-16 DIAGNOSIS — R413 Other amnesia: Secondary | ICD-10-CM

## 2020-12-16 NOTE — Progress Notes (Signed)
Subjective:    Patient ID: Whitney Trujillo, female    DOB: [birth year], 45 y.o.   MRN: [id number]  DOS:  12/16/2020 Type of visit - description: Acute  The patient had a dental extraction and a bone graft 6 days ago.  (#32?  Right lower jaw) Immediately after she wake up from the procedure she felt foggy and she continue to feel that way. She does not recall the following hours of the surgery. Memory returned the day after the procedure.  She still feeling foggy, exhausted, weak. She has some facial numbness around the site of surgery  Was dispensed at 5 hydrocodone last week but has not taken it, ibuprofen prn is appropriate for the degree of pain  she has.  Denies fever chills. Area of surgery does not seem to be swollen, she is able to drink plenty fluids. Has checked her blood pressure: 120/80.  Denies any fever chills No chest pain no difficulty breathing No edema, occasional palpitations?. No nausea vomiting. No cough Denies any headaches, no diplopia or amaurosis fugax. Has chronic mild neck pain, denies neck stiffness. Mild dizziness?.   Review of Systems See above   Past Medical History:  Diagnosis Date  . Acute right lower quadrant pain 01/25/2016  . ADD (attention deficit disorder) 02/02/2015  . Allergy    seasonal allergies  . Anxiety   . Depression with anxiety 03/15/2016  . Endometriosis   . Gallstones   . Hyperlipidemia, mixed 02/02/2015  . Migraine 12/16/2015  . Preventative health care 12/16/2015  . Pulmonary emboli (HCC)    b/l PE after hysterectomy, 5 weeks post op, clotting problem ruled out at [facility]    Past Surgical History:  Procedure Laterality Date  . ABDOMINAL HYSTERECTOMY     ovaries removed, in 2 separate procedures  . CESAREAN SECTION  2007, 2009  . CHOLECYSTECTOMY    . CYSTOSCOPY  10/2014  . EYE SURGERY     lasik b/l  . LAPAROTOMY     Endometriosis  . WISDOM TOOTH EXTRACTION      Allergies as of 12/16/2020      Reactions    Latex Itching   Gluten Meal Other (See Comments)   Cramping and diarrhea      Medication List       Accurate as of December 16, 2020 11:02 AM. If you have any questions, ask your nurse or doctor.        B COMPLEX PO Take by mouth.   FLUoxetine 20 MG capsule Commonly known as: PROZAC Take 1 capsule (20 mg total) by mouth daily.   gabapentin 300 MG capsule Commonly known as: NEURONTIN Take 1 capsule (300 mg total) by mouth 3 (three) times daily as needed.   meloxicam 15 MG tablet Commonly known as: MOBIC Take 15 mg by mouth daily.   Omega-3 1000 MG Caps Take by mouth.   rizatriptan 10 MG tablet Commonly known as: Maxalt Take 1 tablet (10 mg total) by mouth as needed for migraine. May repeat in 2 hours if needed Failed Imitrex with side effects   UNABLE TO FIND Med Name: hormone inserts surgically implanted in hip   VITAMIN D PO Take by mouth. Liquid   ZINC PO Take by mouth.          Objective:   Physical Exam BP 132/80 (BP Location: Left Arm, Patient Position: Sitting, Cuff Size: Small)   Pulse 62   Temp 98 F (36.7 C) (Oral)   Resp 16  Ht 4\' 11"  (1.499 m)   Wt 131 lb 8 oz (59.6 kg)   SpO2 98%   BMI 26.56 kg/m  General:   Well developed, NAD, BMI noted. HEENT:  Normocephalic . Face symmetric, atraumatic. Oral exam: Site of surgery at the right lower jaw: stitches I place, do not see any obvious abscess, swelling or discharge. Neck: Range of motion normal, supple, normal carotid pulses. Lungs:  CTA B Normal respiratory effort, no intercostal retractions, no accessory muscle use. Heart: RRR,  no murmur.  Lower extremities: no pretibial edema bilaterally  Skin: Not pale. Not jaundice Neurologic:  alert & oriented X3.  Speech normal, gait appropriate for age and unassisted EOMI, pupils equal and reactive, motor DTRs: Symmetric Psych--  Cognition and judgment appear intact.  Cooperative with normal attention span and concentration.  Behavior  appropriate. No anxious or depressed appearing.      Assessment     45 year old female, PMH includes history of PE, migraines, psoriasis, ADD, depression, previous infection with COVID-19 (01/2021), on fluoxetine, presents with:  Transient memory loss, fatigue: Symptoms a started shortly after she woke up from a dental procedure.  She does not recall the following hours after the surgery but her memory has returned. Other symptoms continue: Mental fogginess, weakness.  Further ROS benign. Could be effect of the anesthesia?  Effect of prednisone?  (she stopped 2 days ago). Neurological exam is normal, mental status is normal today. I contacted Dr. 02-07-1997 dental office, the procedure lasted 28 minutes,pt received low doses of: Fentanyl, Decadron, Versed, propofol and Zofran Plan: Check CBC, CMP.  Rest, observation, ER if symptoms increase, see AVS. If pain or swelling developed at the area surgery she needs to contact her dentist.   This visit occurred during the SARS-CoV-2 public health emergency.  Safety protocols were in place, including screening questions prior to the visit, additional usage of staff PPE, and extensive cleaning of exam room while observing appropriate contact time as indicated for disinfecting solutions.

## 2020-12-16 NOTE — Patient Instructions (Addendum)
Rest, drink plenty fluids  Check the  blood pressure daily BP GOAL is between 110/65 and  135/85. If it is consistently higher or lower, let me know  Call if not gradually better  Call or go to the ER  anytime if you have severe symptoms: Increased weakness, changes in your memory, severe headache, dizziness.   GO TO THE LAB : Get the blood work

## 2020-12-16 NOTE — Addendum Note (Signed)
Addended by: Rosita Kea on: 12/16/2020 03:03 PM   Modules accepted: Orders

## 2020-12-16 NOTE — Addendum Note (Signed)
Addended by: Rosita Kea on: 12/16/2020 03:53 PM   Modules accepted: Orders

## 2020-12-20 ENCOUNTER — Ambulatory Visit: Payer: 59 | Admitting: Family

## 2020-12-27 ENCOUNTER — Encounter: Payer: Self-pay | Admitting: Internal Medicine

## 2020-12-28 NOTE — Telephone Encounter (Signed)
Spoke with [name] and informed her that the labs were cancelled per [facility] lab and patient states she is feeling better and prefers not to have labs redrawn at this time.

## 2021-01-18 ENCOUNTER — Other Ambulatory Visit (HOSPITAL_BASED_OUTPATIENT_CLINIC_OR_DEPARTMENT_OTHER): Payer: Self-pay | Admitting: Family Medicine

## 2021-01-18 DIAGNOSIS — Z1231 Encounter for screening mammogram for malignant neoplasm of breast: Secondary | ICD-10-CM

## 2021-02-17 ENCOUNTER — Other Ambulatory Visit: Payer: Self-pay

## 2021-02-17 ENCOUNTER — Encounter (HOSPITAL_BASED_OUTPATIENT_CLINIC_OR_DEPARTMENT_OTHER): Payer: Self-pay

## 2021-02-17 ENCOUNTER — Ambulatory Visit (HOSPITAL_BASED_OUTPATIENT_CLINIC_OR_DEPARTMENT_OTHER)
Admission: RE | Admit: 2021-02-17 | Discharge: 2021-02-17 | Disposition: A | Discharge status: Home / Self Care | Payer: 59 | Source: Ambulatory Visit | Attending: Family Medicine | Admitting: Family Medicine

## 2021-02-17 DIAGNOSIS — Z1231 Encounter for screening mammogram for malignant neoplasm of breast: Secondary | ICD-10-CM

## 2021-04-25 ENCOUNTER — Other Ambulatory Visit: Payer: Self-pay | Admitting: Family Medicine

## 2021-05-03 ENCOUNTER — Ambulatory Visit (HOSPITAL_BASED_OUTPATIENT_CLINIC_OR_DEPARTMENT_OTHER)
Admission: RE | Admit: 2021-05-03 | Discharge: 2021-05-03 | Disposition: A | Discharge status: Home / Self Care | Payer: 59 | Source: Ambulatory Visit | Attending: Family | Admitting: Family

## 2021-05-03 ENCOUNTER — Encounter: Payer: Self-pay | Admitting: Family Medicine

## 2021-05-03 ENCOUNTER — Ambulatory Visit: Payer: 59 | Admitting: Family

## 2021-05-03 ENCOUNTER — Other Ambulatory Visit: Payer: Self-pay

## 2021-05-03 VITALS — BP 111/60 | HR 67 | Temp 98.5°F | Resp 16 | Wt 127.8 lb

## 2021-05-03 DIAGNOSIS — R310 Gross hematuria: Secondary | ICD-10-CM

## 2021-05-03 DIAGNOSIS — R109 Unspecified abdominal pain: Secondary | ICD-10-CM | POA: Insufficient documentation

## 2021-05-03 DIAGNOSIS — R300 Dysuria: Secondary | ICD-10-CM

## 2021-05-03 HISTORY — DX: Gross hematuria: R31.0

## 2021-05-03 HISTORY — DX: Unspecified abdominal pain: R10.9

## 2021-05-03 LAB — POC URINALSYSI DIPSTICK (AUTOMATED)
Bilirubin, UA: NEGATIVE
Glucose, UA: NEGATIVE
Ketones, UA: NEGATIVE
Leukocytes, UA: NEGATIVE
Nitrite, UA: NEGATIVE
Protein, UA: NEGATIVE
Spec Grav, UA: <=1.005 — AB (ref 1.010–1.025)
Urobilinogen, UA: NEGATIVE E.U./dL — AB
pH, UA: 6.5 (ref 5.0–8.0)

## 2021-05-03 MED ORDER — CEPHALEXIN 500 MG PO CAPS: 500.0000 mg | ORAL_CAPSULE | Freq: Three times a day (TID) | ORAL | 0 refills | Status: DC

## 2021-05-03 NOTE — Patient Instructions (Signed)
Please start Keflex (antibiotic). Go to the ER if severe/worsening symptoms. Complete CT on the first floor. Let us know if symptoms do not improve or if you see recurrent blood.

## 2021-05-03 NOTE — Progress Notes (Signed)
Subjective:    Patient ID: Whitney Trujillo, female    DOB: 06/13/1976, 45 y.o.   MRN: [id number]  HPI  Pt reports some burning and frequency yesterday. Woke up with gross hematuria this AM. Denies fever or flank pain. She has had a hx of kidney stones. Azo helped her symptoms.  She does have a previous hx of kidney stones.  She reports + dysuria.      Review of Systems See HPI  Past Medical History:  Diagnosis Date   Acute right lower quadrant pain 01/25/2016   ADD (attention deficit disorder) 02/02/2015   Allergy    seasonal allergies   Anxiety    Depression with anxiety 03/15/2016   Endometriosis    Gallstones    Hyperlipidemia, mixed 02/02/2015   Migraine 12/16/2015   Preventative health care 12/16/2015   Pulmonary emboli (HCC)    b/l PE after hysterectomy, 5 weeks post op, clotting problem ruled out at [facility] [place]     Social History   Socioeconomic History   Marital status: Married    Spouse name: Not on file   Number of children: Not on file   Years of education: Not on file   Highest education level: Not on file  Occupational History   Not on file  Tobacco Use   Smoking status: Never   Smokeless tobacco: Never  Vaping Use   Vaping Use: Never used  Substance and Sexual Activity   Alcohol use: Yes    Comment: Socially   Drug use: No   Sexual activity: Yes    Partners: Male    Birth control/protection: Surgical    Comment: lives with husband (ADD) and children, 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  Other Topics Concern   Not on file  Social History Narrative   Lives at home with husband and 2 children   Right handed   Caffeine: 2-3 cups/day   Social Determinants of Health   Financial Resource Strain: Not on file  Food Insecurity: Not on file  Transportation Needs: Not on file  Physical Activity: Not on file  Stress: Not on file  Social Connections: Not on file  Intimate Partner Violence: Not on file    Past Surgical History:  Procedure  Laterality Date   ABDOMINAL HYSTERECTOMY     ovaries removed, in 2 separate procedures   CESAREAN SECTION  2007, 2009   CHOLECYSTECTOMY     CYSTOSCOPY  10/2014   EYE SURGERY     lasik b/l   LAPAROTOMY     Endometriosis   WISDOM TOOTH EXTRACTION      Family History  Problem Relation Age of Onset   Cancer Father        Carcinoid Syndrome, colon   Colon cancer Father    Headache Father    Diabetes Mother        2, diet controlled   ADD / ADHD Son    Heart disease Maternal Grandmother    Stroke Paternal Grandmother    Dementia Paternal Grandmother    COPD Paternal Grandfather        black lun, Forensic scientist, tobacco   Ovarian cancer Maternal Aunt     Allergies  Allergen Reactions   Latex Itching   Gluten Meal Other (See Comments)    Cramping and diarrhea    Current Outpatient Medications on File Prior to Visit  Medication Sig Dispense Refill   FLUoxetine (PROZAC) 20 MG capsule TAKE ONE CAPSULE BY MOUTH DAILY 30  capsule 0   No current facility-administered medications on file prior to visit.    BP 111/60 (BP Location: Right Arm, Patient Position: Sitting, Cuff Size: Small)   Pulse 67   Temp 98.5 F (36.9 C) (Oral)   Resp 16   Wt 127 lb 12.8 oz (58 kg)   SpO2 100%   BMI 25.81 kg/m       Objective:   Physical Exam Constitutional:      Appearance: She is well-developed.  Cardiovascular:     Rate and Rhythm: Normal rate and regular rhythm.     Heart sounds: Normal heart sounds. No murmur heard. Pulmonary:     Effort: Pulmonary effort is normal. No respiratory distress.     Breath sounds: Normal breath sounds. No wheezing.  Abdominal:     General: Bowel sounds are normal.     Palpations: Abdomen is soft.     Comments: Some suprapubic tenderness, right sided abdominal tenderness, left sided abdominal tenderness (L>R)  Mild bilateral CVAT  Psychiatric:        Behavior: Behavior normal.        Thought Content: Thought content normal.        Judgment: Judgment  normal.          Assessment & Plan:

## 2021-05-04 ENCOUNTER — Telehealth: Payer: Self-pay | Admitting: Family

## 2021-05-04 NOTE — Telephone Encounter (Signed)
Contacted pt. She states she is still having some dysuria and back pressure. She has had 2 doses of antibiotics. Advised her to continue abx and send me a mychart message tomorrow AM to let me know how she is feeling.  If no improvement will plan CT with renal stone protocol as KUB was negative.  Pt verbalizes understanding.

## 2021-05-04 NOTE — Assessment & Plan Note (Signed)
New. Will send urine for culture, begin empiric abx with keflex 500mg  PO TID.  KUB is performed and does not show any obvious kidney stones.  ?pyelonephritis. If symptoms fail to improve would recommend CT with kidney stone protocol.

## 2021-05-05 ENCOUNTER — Encounter: Payer: Self-pay | Admitting: Family

## 2021-05-06 NOTE — Telephone Encounter (Signed)
Patient advised urine did not get sent out for the culture and microscopic. She will try to come in today to give another sample. She will keep appointment to see Dr. Mosetta Pigeon

## 2021-05-06 NOTE — Telephone Encounter (Signed)
Message sent to lab to check on results

## 2021-05-09 ENCOUNTER — Encounter: Payer: Self-pay | Admitting: Family Medicine

## 2021-05-09 ENCOUNTER — Other Ambulatory Visit: Payer: Self-pay

## 2021-05-09 ENCOUNTER — Ambulatory Visit: Payer: 59 | Admitting: Family Medicine

## 2021-05-09 VITALS — BP 108/64 | HR 67 | Temp 98.0°F | Resp 16 | Wt 127.4 lb

## 2021-05-09 DIAGNOSIS — R21 Rash and other nonspecific skin eruption: Secondary | ICD-10-CM

## 2021-05-09 DIAGNOSIS — R739 Hyperglycemia, unspecified: Secondary | ICD-10-CM

## 2021-05-09 DIAGNOSIS — E559 Vitamin D deficiency, unspecified: Secondary | ICD-10-CM | POA: Diagnosis not present

## 2021-05-09 DIAGNOSIS — F418 Other specified anxiety disorders: Secondary | ICD-10-CM

## 2021-05-09 DIAGNOSIS — E782 Mixed hyperlipidemia: Secondary | ICD-10-CM | POA: Diagnosis not present

## 2021-05-09 DIAGNOSIS — L409 Psoriasis, unspecified: Secondary | ICD-10-CM

## 2021-05-09 NOTE — Assessment & Plan Note (Signed)
hgba1c acceptable, minimize simple carbs. Increase exercise as tolerated.  

## 2021-05-09 NOTE — Assessment & Plan Note (Signed)
Supplement and monitor 

## 2021-05-09 NOTE — Assessment & Plan Note (Signed)
Encourage heart healthy diet such as MIND or DASH diet, increase exercise, avoid trans fats, simple carbohydrates and processed foods, consider a krill or fish or flaxseed oil cap daily.  °

## 2021-05-09 NOTE — Progress Notes (Signed)
Subjective:   By signing my name below, I, Whitney Trujillo, attest that this documentation has been prepared under the direction and in the presence of Bradd Canary, MD 05/09/2021   Patient ID: Whitney Trujillo, female    DOB: 1976/05/16, 45 y.o.   MRN: [id number]  No chief complaint on file.   HPI Patient is in today for an office visit.  She has been managing her mood with 20 mg fluoxetine and reports she is doing well on it.  She is a candidate for colonoscopy and reports she had one when she was 45 years old. She is unable to get her records.She got the colonoscopy because she was having bowel issues which led to a hysterectomy. She is willing to get the next one when she is 48.  At her last visit to her dermatologist, she was prescribed 300 ml Dupixent to manage erythema and pain in her right hand. She also has a erythematous rash on the back of her neck and ears. She has had 2 doses and reports it has helped with the itching and rash but still feels pain in her hand and thinks it is arthritis.   At her last blood work, her cholesterol level was high.  She participates in exercise regularly. She manages a healthy diet.  She does not consistently use her vitamin D pills because she is always in the sun but knows when she needs to use it.   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 She has been managing the pain with 15 mg meloxicam PO daily and has not undergone physical therapy or any procedures.  She had Covid-19 in May 2021. She has 2 Pfizer Covid-19 vaccines at this time and is not willing to get the booster vaccines.  Past Medical History:  Diagnosis Date   Acute right lower quadrant pain 01/25/2016   ADD (attention deficit disorder) 02/02/2015   Allergy    seasonal allergies   Anxiety    Depression with anxiety 03/15/2016   Endometriosis    Gallstones    Hyperlipidemia, mixed 02/02/2015   Migraine 12/16/2015    Preventative health care 12/16/2015   Pulmonary emboli (HCC)    b/l PE after hysterectomy, 5 weeks post op, clotting problem ruled out at [facility]    Past Surgical History:  Procedure Laterality Date   ABDOMINAL HYSTERECTOMY     ovaries removed, in 2 separate procedures   CESAREAN SECTION  2007, 2009   CHOLECYSTECTOMY     CYSTOSCOPY  10/2014   EYE SURGERY     lasik b/l   LAPAROTOMY     Endometriosis   WISDOM TOOTH EXTRACTION      Family History  Problem Relation Age of Onset   Cancer Father        Carcinoid Syndrome, colon   Colon cancer Father    Headache Father    Diabetes Mother        2, diet controlled   ADD / ADHD Son    Heart disease Maternal Grandmother    Stroke Paternal Grandmother    Dementia Paternal Grandmother    COPD Paternal Grandfather        black lun, Forensic scientist, tobacco   Ovarian cancer Maternal Aunt     Social History   Socioeconomic History   Marital status: Married    Spouse name: Not on file   Number of children: Not  on file   Years of education: Not on file   Highest education level: Not on file  Occupational History   Not on file  Tobacco Use   Smoking status: Never   Smokeless tobacco: Never  Vaping Use   Vaping Use: Never used  Substance and Sexual Activity   Alcohol use: Yes    Comment: Socially   Drug use: No   Sexual activity: Yes    Partners: Male    Birth control/protection: Surgical    Comment: lives with husband (ADD) and children, 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  Other Topics Concern   Not on file  Social History Narrative   Lives at home with husband and 2 children   Right handed   Caffeine: 2-3 cups/day   Social Determinants of Health   Financial Resource Strain: Not on file  Food Insecurity: Not on file  Transportation Needs: Not on file  Physical Activity: Not on file  Stress: Not on file  Social Connections: Not on file  Intimate Partner Violence: Not on file    Outpatient Medications  Prior to Visit  Medication Sig Dispense Refill   Dupilumab (DUPIXENT) 300 MG/2ML SOPN Inject 300 mg into the skin every 14 (fourteen) days. 3.92 mL    FLUoxetine (PROZAC) 20 MG capsule TAKE ONE CAPSULE BY MOUTH DAILY 30 capsule 0   meloxicam (MOBIC) 15 MG tablet Take 15 mg by mouth daily.     cephALEXin (KEFLEX) 500 MG capsule Take 1 capsule (500 mg total) by mouth 3 (three) times daily. 15 capsule 0   No facility-administered medications prior to visit.    Allergies  Allergen Reactions   Latex Itching   Gluten Meal Other (See Comments)    Cramping and diarrhea    Review of Systems  Constitutional:  Negative for fever and malaise/fatigue.  HENT:  Negative for congestion.   Eyes:  Negative for redness.  Respiratory:  Negative for shortness of breath.   Cardiovascular:  Negative for chest pain, palpitations and leg swelling.  Gastrointestinal:  Negative for abdominal pain, blood in stool and nausea.  Genitourinary:  Negative for dysuria and frequency.  Musculoskeletal:  Negative for falls.  Skin:  Positive for itching (right hand) and rash (right hand).  Neurological:  Negative for dizziness, loss of consciousness and headaches.  Endo/Heme/Allergies:  Negative for polydipsia.  Psychiatric/Behavioral:  Negative for depression. The patient is not nervous/anxious.       Objective:    Physical Exam Constitutional:      General: She is not in acute distress.    Appearance: She is well-developed.  HENT:     Head: Normocephalic and atraumatic.     Ears:     Comments: Skin in canal red and flaky Eyes:     Conjunctiva/sclera: Conjunctivae normal.  Neck:     Thyroid: No thyromegaly.  Cardiovascular:     Rate and Rhythm: Normal rate and regular rhythm.     Heart sounds: Normal heart sounds. No murmur heard. Pulmonary:     Effort: Pulmonary effort is normal. No respiratory distress.     Breath sounds: Normal breath sounds.  Abdominal:     General: Bowel sounds are normal. There  is no distension.     Palpations: Abdomen is soft. There is no mass.     Tenderness: There is no abdominal tenderness.  Musculoskeletal:     Cervical back: Neck supple.  Lymphadenopathy:     Cervical: No cervical adenopathy.  Skin:  General: Skin is warm and dry.     Comments: 3 cm wide, 2 cm tall erythematous raised rash on the back of her neck. Erythematous area on the back of right hand.   Neurological:     Mental Status: She is alert and oriented to person, place, and time.  Psychiatric:        Behavior: Behavior normal.    BP 108/64   Pulse 67   Temp 98 F (36.7 C)   Resp 16   Wt 127 lb 6.4 oz (57.8 kg)   SpO2 99%   BMI 25.73 kg/m  Wt Readings from Last 3 Encounters:  05/09/21 127 lb 6.4 oz (57.8 kg)  05/03/21 127 lb 12.8 oz (58 kg)  12/16/20 131 lb 8 oz (59.6 kg)    Diabetic Foot Exam - Simple   No data filed    Lab Results  Component Value Date   WBC 5.8 11/09/2020   HGB 14.5 11/09/2020   HCT 43.2 11/09/2020   PLT 277.0 11/09/2020   GLUCOSE 82 11/09/2020   CHOL 249 (H) 11/09/2020   TRIG 135.0 11/09/2020   HDL 60.60 11/09/2020   LDLCALC 161 (H) 11/09/2020   ALT 16 11/09/2020   AST 14 11/09/2020   NA 138 11/09/2020   K 4.1 11/09/2020   CL 100 11/09/2020   CREATININE 0.85 11/09/2020   BUN 15 11/09/2020   CO2 30 11/09/2020   TSH 1.30 11/09/2020   HGBA1C 5.5 11/09/2020    Lab Results  Component Value Date   TSH 1.30 11/09/2020   Lab Results  Component Value Date   WBC 5.8 11/09/2020   HGB 14.5 11/09/2020   HCT 43.2 11/09/2020   MCV 89.4 11/09/2020   PLT 277.0 11/09/2020   Lab Results  Component Value Date   NA 138 11/09/2020   K 4.1 11/09/2020   CO2 30 11/09/2020   GLUCOSE 82 11/09/2020   BUN 15 11/09/2020   CREATININE 0.85 11/09/2020   BILITOT 1.2 11/09/2020   ALKPHOS 31 (L) 11/09/2020   AST 14 11/09/2020   ALT 16 11/09/2020   PROT 7.1 11/09/2020   ALBUMIN 4.5 11/09/2020   CALCIUM 9.5 11/09/2020   ANIONGAP 12 03/07/2020   GFR  82.20 11/09/2020   Lab Results  Component Value Date   CHOL 249 (H) 11/09/2020   Lab Results  Component Value Date   HDL 60.60 11/09/2020   Lab Results  Component Value Date   LDLCALC 161 (H) 11/09/2020   Lab Results  Component Value Date   TRIG 135.0 11/09/2020   Lab Results  Component Value Date   CHOLHDL 4 11/09/2020   Lab Results  Component Value Date   HGBA1C 5.5 11/09/2020       Assessment & Plan:   Problem List Items Addressed This Visit     Hyperlipidemia, mixed    Encourage heart healthy diet such as MIND or DASH diet, increase exercise, avoid trans fats, simple carbohydrates and processed foods, consider a krill or fish or flaxseed oil cap daily.       Relevant Orders   Lipid panel   Vitamin D deficiency    Supplement and monitor      Relevant Orders   VITAMIN D 25 Hydroxy (Vit-D Deficiency, Fractures)   Psoriasis    She is following with dermatology, Dr Naoma DienerMigliardi and has been started on Dupixant. Was previously prescribed Meloxicam by rheumatology and that was helpful. Will give prescription today      Hyperglycemia  hgba1c acceptable, minimize simple carbs. Increase exercise as tolerated.       Relevant Orders   Comprehensive metabolic panel   TSH   Hemoglobin A1c   RESOLVED: Depression with anxiety    Stable on current meds      Other Visit Diagnoses     Rash    -  Primary   Relevant Orders   CBC       No orders of the defined types were placed in this encounter.   I,Whitney Trujillo,acting as a Neurosurgeon for Danise Edge, MD.,have documented all relevant documentation on the behalf of Danise Edge, MD,as directed by  Danise Edge, MD while in the presence of Danise Edge, MD.   I, Bradd Canary, MD , personally preformed the services described in this documentation.  All medical record entries made by the scribe were at my direction and in my presence.  I have reviewed the chart and discharge instructions (if applicable) and agree  that the record reflects my personal performance and is accurate and complete. 05/09/2021

## 2021-05-09 NOTE — Assessment & Plan Note (Signed)
Stable on current meds 

## 2021-05-09 NOTE — Patient Instructions (Signed)
High Cholesterol  High cholesterol is a condition in which the blood has high levels of a white, waxy substance similar to fat (cholesterol). The liver makes all the cholesterol that the body needs. The human body needs small amounts of cholesterol to help build cells. A person gets extra orexcess cholesterol from the food that he or she eats. The blood carries cholesterol from the liver to the rest of the body. If you have high cholesterol, deposits (plaques) may build up on the walls of your arteries. Arteries are the blood vessels that carry blood away from your heart. These plaques make the arteries narrowand stiff. Cholesterol plaques increase your risk for heart attack and stroke. Work withyour health care provider to keep your cholesterol levels in a healthy range. What increases the risk? The following factors may make you more likely to develop this condition: Eating foods that are high in animal fat (saturated fat) or cholesterol. Being overweight. Not getting enough exercise. A family history of high cholesterol (familial hypercholesterolemia). Use of tobacco products. Having diabetes. What are the signs or symptoms? There are no symptoms of this condition. How is this diagnosed? This condition may be diagnosed based on the results of a blood test. If you are older than 47 years of age, your health care provider may check your cholesterol levels every 4-6 years. You may be checked more often if you have high cholesterol or other risk factors for heart disease. The blood test for cholesterol measures: "Bad" cholesterol, or LDL cholesterol. This is the main type of cholesterol that causes heart disease. The desired level is less than 100 mg/dL. "Good" cholesterol, or HDL cholesterol. HDL helps protect against heart disease by cleaning the arteries and carrying the LDL to the liver for processing. The desired level for HDL is 60 mg/dL or higher. Triglycerides. These are fats that your  body can store or burn for energy. The desired level is less than 150 mg/dL. Total cholesterol. This measures the total amount of cholesterol in your blood and includes LDL, HDL, and triglycerides. The desired level is less than 200 mg/dL. How is this treated? This condition may be treated with: Diet changes. You may be asked to eat foods that have more fiber and less saturated fats or added sugar. Lifestyle changes. These may include regular exercise, maintaining a healthy weight, and quitting use of tobacco products. Medicines. These are given when diet and lifestyle changes have not worked. You may be prescribed a statin medicine to help lower your cholesterol levels. Follow these instructions at home: Eating and drinking  Eat a healthy, balanced diet. This diet includes: Daily servings of a variety of fresh, frozen, or canned fruits and vegetables. Daily servings of whole grain foods that are rich in fiber. Foods that are low in saturated fats and trans fats. These include poultry and fish without skin, lean cuts of meat, and low-fat dairy products. A variety of fish, especially oily fish that contain omega-3 fatty acids. Aim to eat fish at least 2 times a week. Avoid foods and drinks that have added sugar. Use healthy cooking methods, such as roasting, grilling, broiling, baking, poaching, steaming, and stir-frying. Do not fry your food except for stir-frying.  Lifestyle  Get regular exercise. Aim to exercise for a total of 150 minutes a week. Increase your activity level by doing activities such as gardening, walking, and taking the stairs. Do not use any products that contain nicotine or tobacco, such as cigarettes, e-cigarettes, and chewing tobacco.   If you need help quitting, ask your health care provider.  General instructions Take over-the-counter and prescription medicines only as told by your health care provider. Keep all follow-up visits as told by your health care provider.  This is important. Where to find more information American Heart Association: www.heart.org National Heart, Lung, and Blood Institute: www.nhlbi.nih.gov Contact a health care provider if: You have trouble achieving or maintaining a healthy diet or weight. You are starting an exercise program. You are unable to stop smoking. Get help right away if: You have chest pain. You have trouble breathing. You have any symptoms of a stroke. "BE FAST" is an easy way to remember the main warning signs of a stroke: B - Balance. Signs are dizziness, sudden trouble walking, or loss of balance. E - Eyes. Signs are trouble seeing or a sudden change in vision. F - Face. Signs are sudden weakness or numbness of the face, or the face or eyelid drooping on one side. A - Arms. Signs are weakness or numbness in an arm. This happens suddenly and usually on one side of the body. S - Speech. Signs are sudden trouble speaking, slurred speech, or trouble understanding what people say. T - Time. Time to call emergency services. Write down what time symptoms started. You have other signs of a stroke, such as: A sudden, severe headache with no known cause. Nausea or vomiting. Seizure. These symptoms may represent a serious problem that is an emergency. Do not wait to see if the symptoms will go away. Get medical help right away. Call your local emergency services (911 in the [place] Do not drive yourself to the hospital. Summary Cholesterol plaques increase your risk for heart attack and stroke. Work with your health care provider to keep your cholesterol levels in a healthy range. Eat a healthy, balanced diet, get regular exercise, and maintain a healthy weight. Do not use any products that contain nicotine or tobacco, such as cigarettes, e-cigarettes, and chewing tobacco. Get help right away if you have any symptoms of a stroke. This information is not intended to replace advice given to you by your health care  provider. Make sure you discuss any questions you have with your healthcare provider. Document Revised: 08/11/2019 Document Reviewed: 08/11/2019 Elsevier Patient Education  2022 [employer]  

## 2021-05-15 NOTE — Assessment & Plan Note (Addendum)
She is following with dermatology, Dr Naoma Diener and has been started on Dupixant. Was previously prescribed Meloxicam by rheumatology and that was helpful. Will give prescription today

## 2021-05-23 ENCOUNTER — Other Ambulatory Visit (INDEPENDENT_AMBULATORY_CARE_PROVIDER_SITE_OTHER): Payer: 59

## 2021-05-23 ENCOUNTER — Other Ambulatory Visit: Payer: Self-pay

## 2021-05-23 DIAGNOSIS — R300 Dysuria: Secondary | ICD-10-CM | POA: Diagnosis not present

## 2021-05-23 DIAGNOSIS — R310 Gross hematuria: Secondary | ICD-10-CM

## 2021-05-23 LAB — URINALYSIS, ROUTINE W REFLEX MICROSCOPIC
Bilirubin Urine: NEGATIVE
Ketones, ur: NEGATIVE
Leukocytes,Ua: NEGATIVE
Nitrite: NEGATIVE
Specific Gravity, Urine: 1.01 (ref 1.000–1.030)
Total Protein, Urine: NEGATIVE
Urine Glucose: NEGATIVE
Urobilinogen, UA: 0.2 (ref 0.0–1.0)
pH: 6 (ref 5.0–8.0)

## 2021-05-23 NOTE — Addendum Note (Signed)
Addended by: Mervin Kung A on: 05/23/2021 09:27 AM   Modules accepted: Orders

## 2021-05-24 ENCOUNTER — Other Ambulatory Visit: Payer: Self-pay | Admitting: Family Medicine

## 2021-05-24 LAB — URINE CULTURE
MICRO NUMBER:: 12303438
SPECIMEN QUALITY:: ADEQUATE

## 2021-06-22 ENCOUNTER — Other Ambulatory Visit: Payer: Self-pay | Admitting: Family Medicine

## 2021-11-07 ENCOUNTER — Other Ambulatory Visit (INDEPENDENT_AMBULATORY_CARE_PROVIDER_SITE_OTHER): Payer: 59

## 2021-11-07 DIAGNOSIS — E559 Vitamin D deficiency, unspecified: Secondary | ICD-10-CM | POA: Diagnosis not present

## 2021-11-07 DIAGNOSIS — R21 Rash and other nonspecific skin eruption: Secondary | ICD-10-CM

## 2021-11-07 DIAGNOSIS — E782 Mixed hyperlipidemia: Secondary | ICD-10-CM

## 2021-11-07 DIAGNOSIS — R739 Hyperglycemia, unspecified: Secondary | ICD-10-CM | POA: Diagnosis not present

## 2021-11-07 LAB — CBC
HCT: 40.4 % (ref 36.0–46.0)
Hemoglobin: 13.7 g/dL (ref 12.0–15.0)
MCHC: 34 g/dL (ref 30.0–36.0)
MCV: 88.2 fl (ref 78.0–100.0)
Platelets: 245 10*3/uL (ref 150.0–400.0)
RBC: 4.58 Mil/uL (ref 3.87–5.11)
RDW: 12.7 % (ref 11.5–15.5)
WBC: 5.2 10*3/uL (ref 4.0–10.5)

## 2021-11-07 LAB — VITAMIN D 25 HYDROXY (VIT D DEFICIENCY, FRACTURES): VITD: 57.62 ng/mL (ref 30.00–100.00)

## 2021-11-07 LAB — TSH: TSH: 2.52 u[IU]/mL (ref 0.35–5.50)

## 2021-11-07 LAB — HEMOGLOBIN A1C: Hgb A1c MFr Bld: 5.5 % (ref 4.6–6.5)

## 2021-11-08 LAB — LIPID PANEL
Cholesterol: 226 mg/dL — ABNORMAL HIGH (ref 0–200)
HDL: 55.8 mg/dL (ref >39.00)
LDL Cholesterol: 140 mg/dL — ABNORMAL HIGH (ref 0–99)
NonHDL: 170.08
Total CHOL/HDL Ratio: 4
Triglycerides: 150 mg/dL — ABNORMAL HIGH (ref 0.0–149.0)
VLDL: 30 mg/dL (ref 0.0–40.0)

## 2021-11-08 LAB — COMPREHENSIVE METABOLIC PANEL
ALT: 15 U/L (ref 0–35)
AST: 14 U/L (ref 0–37)
Albumin: 4.4 g/dL (ref 3.5–5.2)
Alkaline Phosphatase: 29 U/L — ABNORMAL LOW (ref 39–117)
BUN: 21 mg/dL (ref 6–23)
CO2: 31 mEq/L (ref 19–32)
Calcium: 9.2 mg/dL (ref 8.4–10.5)
Chloride: 103 mEq/L (ref 96–112)
Creatinine, Ser: 0.83 mg/dL (ref 0.40–1.20)
GFR: 84 mL/min (ref >60.00)
Glucose, Bld: 95 mg/dL (ref 70–99)
Potassium: 4.4 mEq/L (ref 3.5–5.1)
Sodium: 137 mEq/L (ref 135–145)
Total Bilirubin: 0.6 mg/dL (ref 0.2–1.2)
Total Protein: 6.5 g/dL (ref 6.0–8.3)

## 2021-11-15 ENCOUNTER — Encounter: Payer: 59 | Admitting: Family Medicine

## 2021-12-01 ENCOUNTER — Encounter: Payer: 59 | Admitting: Family Medicine

## 2021-12-12 ENCOUNTER — Encounter: Payer: Self-pay | Admitting: Family Medicine

## 2021-12-12 ENCOUNTER — Ambulatory Visit (INDEPENDENT_AMBULATORY_CARE_PROVIDER_SITE_OTHER): Payer: 59 | Admitting: Family Medicine

## 2021-12-12 VITALS — BP 118/66 | HR 64 | Ht 59.0 in | Wt 130.8 lb

## 2021-12-12 DIAGNOSIS — F418 Other specified anxiety disorders: Secondary | ICD-10-CM | POA: Diagnosis not present

## 2021-12-12 DIAGNOSIS — Z0000 Encounter for general adult medical examination without abnormal findings: Secondary | ICD-10-CM | POA: Diagnosis not present

## 2021-12-12 DIAGNOSIS — M1990 Unspecified osteoarthritis, unspecified site: Secondary | ICD-10-CM

## 2021-12-12 MED ORDER — MELOXICAM 15 MG PO TABS: 15.0000 mg | ORAL_TABLET | Freq: Every day | ORAL | 3 refills | Status: DC

## 2021-12-12 MED ORDER — FLUOXETINE HCL 20 MG PO CAPS: 20.0000 mg | ORAL_CAPSULE | Freq: Every day | ORAL | 1 refills | Status: DC

## 2021-12-12 NOTE — Progress Notes (Signed)
? ?BP 118/66   Pulse 64   Ht 4\' 11"  (1.499 m)   Wt 130 lb 12.8 oz (59.3 kg)   BMI 26.42 kg/m?   ? ?Subjective:  ? ? Patient ID: Whitney Trujillo, female    DOB: [birth year], 46 y.o.   MRN: [id number] ? ?HPI: ?Whitney Trujillo is a 46 y.o. female presenting on 12/12/2021 for comprehensive medical examination. Current medical complaints include: no ? ?She currently lives with: husband and kids  ?Interim Problems from her last visit: no  ? ?She reports regular vision exams q1-5y: yes ?She reports regular dental exams q 62m: yes ?Her diet consists of: small frequent meals ?She endorses exercise and/or activity of: occasional  ?She works at: [employer] ? ?She endorses ETOH use. - maybe one serving per month  ?She denies nictoine use. ?She denies illegal substance use.  ? ? ?She reports hysterectomy (total) ?Current menopausal symptoms: no ?She is currently  sexually active with husband ?She denies  concerns today about STI ?Contraception choices are: n/a ? ?She denies concerns about skin changes today. ?She denies concerns about bowel changes today. ?She denies concerns about bladder changes today. ? ? Depression Screen done today and results listed below:  ?Depression screen [facility] 2/9 12/12/2021 05/03/2021 11/09/2020 07/02/2020 07/02/2020  ?Decreased Interest 0 0 0 0 0  ?Down, Depressed, Hopeless 0 0 0 0 0  ?PHQ - 2 Score 0 0 0 0 0  ?Altered sleeping - - 0 0 -  ?Tired, decreased energy - - 0 0 -  ?Change in appetite - - 0 0 -  ?Feeling bad or failure about yourself  - - 0 0 -  ?Trouble concentrating - - 0 0 -  ?Moving slowly or fidgety/restless - - 0 0 -  ?Suicidal thoughts - - 0 0 -  ?PHQ-9 Score - - 0 0 -  ? ? ?She does not have a history of falls.  ? ? ? ? ? ?Past Medical History:  ?Past Medical History:  ?Diagnosis Date  ? Acute right lower quadrant pain 01/25/2016  ? ADD (attention deficit disorder) 02/02/2015  ? Allergy   ? seasonal allergies  ? Anxiety   ? Depression with anxiety 03/15/2016  ? Endometriosis   ?  Gallstones   ? Hyperlipidemia, mixed 02/02/2015  ? Migraine 12/16/2015  ? Preventative health care 12/16/2015  ? Pulmonary emboli (HCC)   ? b/l PE after hysterectomy, 5 weeks post op, clotting problem ruled out at [facility]  ? ? ?Surgical History:  ?Past Surgical History:  ?Procedure Laterality Date  ? ABDOMINAL HYSTERECTOMY    ? ovaries removed, in 2 separate procedures  ? CESAREAN SECTION  2007, 2009  ? CHOLECYSTECTOMY    ? CYSTOSCOPY  10/2014  ? EYE SURGERY    ? lasik b/l  ? LAPAROTOMY    ? Endometriosis  ? WISDOM TOOTH EXTRACTION    ? ? ?Medications:  ?Current Outpatient Medications on File Prior to Visit  ?Medication Sig  ? Dupilumab (DUPIXENT) 300 MG/2ML SOPN Inject 300 mg into the skin every 14 (fourteen) days.  ? ?No current facility-administered medications on file prior to visit.  ? ? ?Allergies:  ?Allergies  ?Allergen Reactions  ? Latex Itching  ? Gluten Meal Other (See Comments)  ?  Cramping and diarrhea  ? ? ?Social History:  ?Social History  ? ?Socioeconomic History  ? Marital status: Married  ?  Spouse name: Not on file  ? Number of children: Not on file  ?  Years of education: Not on file  ? Highest education level: Not on file  ?Occupational History  ? Not on file  ?Tobacco Use  ? Smoking status: Never  ? Smokeless tobacco: Never  ?Vaping Use  ? Vaping Use: Never used  ?Substance and Sexual Activity  ? Alcohol use: Yes  ?  Comment: Socially  ? Drug use: No  ? Sexual activity: Yes  ?  Partners: Male  ?  Birth control/protection: Surgical  ?  Comment: lives with husband (ADD) and children, 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  ?Other Topics Concern  ? Not on file  ?Social History Narrative  ? Lives at home with husband and 2 children  ? Right handed  ? Caffeine: 2-3 cups/day  ? ?Social Determinants of Health  ? ?Financial Resource Strain: Not on file  ?Food Insecurity: Not on file  ?Transportation Needs: Not on file  ?Physical Activity: Not on file  ?Stress: Not on file  ?Social Connections: Not on  file  ?Intimate Partner Violence: Not on file  ? ?Social History  ? ?Tobacco Use  ?Smoking Status Never  ?Smokeless Tobacco Never  ? ?Social History  ? ?Substance and Sexual Activity  ?Alcohol Use Yes  ? Comment: Socially  ? ? ?Family History:  ?Family History  ?Problem Relation Age of Onset  ? Cancer Father   ?     Carcinoid Syndrome, colon  ? Colon cancer Father   ? Headache Father   ? Diabetes Mother   ?     2, diet controlled  ? ADD / ADHD Son   ? Heart disease Maternal Grandmother   ? Stroke Paternal Grandmother   ? Dementia Paternal Grandmother   ? COPD Paternal Grandfather   ?     black lun, Forensic scientist, tobacco  ? Ovarian cancer Maternal Aunt   ? ? ?Past medical history, surgical history, medications, allergies, family history and social history reviewed with patient today and changes made to appropriate areas of the chart.  ? ?All ROS negative except what is listed above and in the HPI.  ? ?   ?Objective:  ?  ?BP 118/66   Pulse 64   Ht 4\' 11"  (1.499 m)   Wt 130 lb 12.8 oz (59.3 kg)   BMI 26.42 kg/m?   ?Wt Readings from Last 3 Encounters:  ?12/12/21 130 lb 12.8 oz (59.3 kg)  ?05/09/21 127 lb 6.4 oz (57.8 kg)  ?05/03/21 127 lb 12.8 oz (58 kg)  ?  ?Physical Exam ?Vitals reviewed.  ?Constitutional:   ?   Appearance: Normal appearance.  ?HENT:  ?   Head: Normocephalic and atraumatic.  ?   Right Ear: Tympanic membrane normal.  ?   Left Ear: Tympanic membrane normal.  ?   Nose: Nose normal.  ?   Mouth/Throat:  ?   Mouth: Mucous membranes are moist.  ?   Pharynx: Oropharynx is clear.  ?Eyes:  ?   Extraocular Movements: Extraocular movements intact.  ?   Conjunctiva/sclera: Conjunctivae normal.  ?   Pupils: Pupils are equal, round, and reactive to light.  ?Cardiovascular:  ?   Rate and Rhythm: Normal rate and regular rhythm.  ?   Pulses: Normal pulses.  ?   Heart sounds: Normal heart sounds.  ?Pulmonary:  ?   Effort: Pulmonary effort is normal.  ?   Breath sounds: Normal breath sounds.  ?Abdominal:  ?   General:  Abdomen is flat. Bowel sounds are normal. There is no  distension.  ?   Palpations: Abdomen is soft. There is no mass.  ?   Tenderness: There is no abdominal tenderness. There is no right CVA tenderness, left CVA tenderness, guarding or rebound.  ?Musculoskeletal:     ?   General: Normal range of motion.  ?   Cervical back: Normal range of motion and neck supple. No tenderness.  ?Lymphadenopathy:  ?   Cervical: No cervical adenopathy.  ?Skin: ?   General: Skin is warm and dry.  ?   Capillary Refill: Capillary refill takes less than 2 seconds.  ?Neurological:  ?   General: No focal deficit present.  ?   Mental Status: She is alert and oriented to person, place, and time. Mental status is at baseline.  ?Psychiatric:     ?   Mood and Affect: Mood normal.     ?   Behavior: Behavior normal.     ?   Thought Content: Thought content normal.     ?   Judgment: Judgment normal.  ? ? ?Results for orders placed or performed in visit on 11/07/21  ?Hemoglobin A1c  ?Result Value Ref Range  ? Hgb A1c MFr Bld 5.5 4.6 - 6.5 %  ?VITAMIN D 25 Hydroxy (Vit-D Deficiency, Fractures)  ?Result Value Ref Range  ? VITD 57.62 30.00 - 100.00 ng/mL  ?TSH  ?Result Value Ref Range  ? TSH 2.52 0.35 - 5.50 uIU/mL  ?Lipid panel  ?Result Value Ref Range  ? Cholesterol 226 (H) 0 - 200 mg/dL  ? Triglycerides 150.0 (H) 0.0 - 149.0 mg/dL  ? HDL 55.80 >39.00 mg/dL  ? VLDL 30.0 0.0 - 40.0 mg/dL  ? LDL Cholesterol 140 (H) 0 - 99 mg/dL  ? Total CHOL/HDL Ratio 4   ? NonHDL 170.08   ?Comprehensive metabolic panel  ?Result Value Ref Range  ? Sodium 137 135 - 145 mEq/L  ? Potassium 4.4 3.5 - 5.1 mEq/L  ? Chloride 103 96 - 112 mEq/L  ? CO2 31 19 - 32 mEq/L  ? Glucose, Bld 95 70 - 99 mg/dL  ? BUN 21 6 - 23 mg/dL  ? Creatinine, Ser 0.83 0.40 - 1.20 mg/dL  ? Total Bilirubin 0.6 0.2 - 1.2 mg/dL  ? Alkaline Phosphatase 29 (L) 39 - 117 U/L  ? AST 14 0 - 37 U/L  ? ALT 15 0 - 35 U/L  ? Total Protein 6.5 6.0 - 8.3 g/dL  ? Albumin 4.4 3.5 - 5.2 g/dL  ? GFR 84.00 >60.00  mL/min  ? Calcium 9.2 8.4 - 10.5 mg/dL  ?CBC  ?Result Value Ref Range  ? WBC 5.2 4.0 - 10.5 K/uL  ? RBC 4.58 3.87 - 5.11 Mil/uL  ? Platelets 245.0 150.0 - 400.0 K/uL  ? Hemoglobin 13.7 12.0 - 15.0 g/dL  ? HCT 40.4 36

## 2021-12-17 DIAGNOSIS — L309 Dermatitis, unspecified: Secondary | ICD-10-CM | POA: Diagnosis present

## 2021-12-20 ENCOUNTER — Other Ambulatory Visit: Payer: Self-pay | Admitting: Family Medicine

## 2021-12-20 DIAGNOSIS — F418 Other specified anxiety disorders: Secondary | ICD-10-CM

## 2021-12-29 ENCOUNTER — Encounter: Payer: Self-pay | Admitting: Physical Therapy

## 2021-12-29 ENCOUNTER — Ambulatory Visit: Payer: 59 | Attending: Specialist | Admitting: Physical Therapy

## 2021-12-29 DIAGNOSIS — M542 Cervicalgia: Secondary | ICD-10-CM | POA: Diagnosis present

## 2021-12-29 DIAGNOSIS — R252 Cramp and spasm: Secondary | ICD-10-CM | POA: Insufficient documentation

## 2021-12-29 DIAGNOSIS — M6281 Muscle weakness (generalized): Secondary | ICD-10-CM | POA: Insufficient documentation

## 2021-12-29 DIAGNOSIS — R293 Abnormal posture: Secondary | ICD-10-CM | POA: Diagnosis present

## 2021-12-29 NOTE — Therapy (Signed)
[place] [facility] ?[address]  [address] ?[place], [place], [address] ?Phone: [phone]   Fax:  [phone] ? ?Physical Therapy Evaluation ? ?Patient Details  ?[facility] ?MRN: [id number] ?Date of Birth: Sep 17, 1976 ?Referring Provider (PT): Jene EveryBeane, Jeffrey MD ? ? ?Encounter Date: 12/29/2021 ? ? PT End of Session - 12/29/21 1156   ? ? Visit Number 1   ? Number of Visits 12   ? Date for PT Re-Evaluation 02/09/22   ? Authorization Type UHC   ? Progress Note Due on Visit 10   ? PT Start Time 1103   ? PT Stop Time 1150   ? PT Time Calculation (min) 47 min   ? Activity Tolerance Patient tolerated treatment well   ? Behavior During [facility] for tasks assessed/performed   ? ?  ?  ? ?  ? ? ?Past Medical History:  ?Diagnosis Date  ? Acute right lower quadrant pain 01/25/2016  ? ADD (attention deficit disorder) 02/02/2015  ? Allergy   ? seasonal allergies  ? Anxiety   ? Depression with anxiety 03/15/2016  ? Endometriosis   ? Gallstones   ? Hyperlipidemia, mixed 02/02/2015  ? Migraine 12/16/2015  ? Preventative health care 12/16/2015  ? Pulmonary emboli (HCC)   ? b/l PE after hysterectomy, 5 weeks post op, [facility]  ? ? ?Past Surgical History:  ?Procedure Laterality Date  ? ABDOMINAL HYSTERECTOMY    ? ovaries removed, in 2 separate procedures  ? CESAREAN SECTION  2007, 2009  ? CHOLECYSTECTOMY    ? CYSTOSCOPY  10/2014  ? EYE SURGERY    ? lasik b/l  ? LAPAROTOMY    ? Endometriosis  ? WISDOM TOOTH EXTRACTION    ? ? ?There were no vitals filed for this visit. ? ? ? Subjective Assessment - 12/29/21 1108   ? ? Subjective Patient fell on March 20th coming up an stoop, fell foward catching self on her L arm and knee, hard, followed by large pop and knot after a couple of days.  She started having severe neck pain, orthopedist think it is a whiplash injury.  Pt. is hairdressor and having difficulty working.  She reports her traps have always be tight  and painful, especially when working, but severely aggravated by the fall.  Just finished prednisone pack.   ? Pertinent History MRI of the cervical spine demonstrates disc bulging osteophyte complex C5-6 paracentral to the left with moderately severe left facet arthrosis. Severe facet arthrosis on the right C7 and T1 with moderately severe foraminal narrowing displacing the exiting right C8 nerve root. Moderately severe bilateral foraminal narrowing at C3-4. Since her previous MRI perhaps some mild progression in the foraminal stenosis at C5-6 on the left     1. Acute cervical strain hyperextension with radiculopathy C8 nerve root distribution. Foraminal stenosis on the right. No evidence of fracture or instability   2. Bilateral cubital tunnel syndrome   3. 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 Facial dysesthesias predating the injury   ? Diagnostic tests MRI   ? Patient Stated Goals release tension in upper back   ? Currently in Pain? Yes   ? Pain Score 5    wrost 8-9/10  ? Pain Location Neck   ? Pain Orientation Mid;Right   ? Pain Descriptors / Indicators Aching;Numbness;Sharp   ? Pain Type Acute pain   ? Pain Radiating Towards down arms and  down back, numbness and tingling in hands   ? Pain Onset 1 to 4 weeks ago   ? Pain Frequency Constant   ? Aggravating Factors  raising arms, especially sustained   ? Pain Relieving Factors heat, muscle relaxors   ? Effect of Pain on Daily Activities difficulty and pain with working, can only do 1 client/day currently   ? ?  ?  ? ?  ? ? ? ? ? [facility] PT Assessment - 12/29/21 0001   ? ?  ? Assessment  ? Medical Diagnosis M54.2 (ICD-10-CM) - Cervical spine pain   ? Referring Provider (PT) Jene Every MD   ? Hand Dominance Right   ? Next MD Visit Feb 01, 2022   ? Prior Therapy no   ?  ? Precautions  ? Precautions None   ?  ? Restrictions  ? Weight Bearing Restrictions No   ?  ? Balance Screen  ? Has the patient fallen in the past 6 months Yes   ? How many  times? 1   ? Has the patient had a decrease in activity level because of a fear of falling?  No   ? Is the patient reluctant to leave their home because of a fear of falling?  No   ?  ? Home Environment  ? Living Environment Private residence   ? Living Arrangements Spouse/significant other   ?  ? Prior Function  ? Level of Independence Independent   ? Vocation Part time employment   ? 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 Leisure church   ?  ? Cognition  ? Overall Cognitive Status Within Functional Limits for tasks assessed   ?  ? Observation/Other Assessments  ? Observations enters with very guard, rigid movements, elevated UT   ? Focus on Therapeutic Outcomes (FOTO)  neck: 34%, predicted outcome 60% after 12 visits.   ?  ? Sensation  ? Light Touch Appears Intact   ? Additional Comments tingling in 4th finger bil, reports R side feels "heavier" but sensation intact   ?  ? Posture/Postural Control  ? Posture/Postural Control Postural limitations   ? Postural Limitations Rounded Shoulders   ? Posture Comments decreased cervical lordosis   ?  ? ROM / Strength  ? AROM / PROM / Strength AROM;Strength   ?  ? AROM  ? Overall AROM  Deficits   ? Overall AROM Comments extremely slow guarded movements, tested in sitting.  Shoulder ROM WNL and symmetric bil.   ? AROM Assessment Site Cervical   ? Cervical Flexion 50   very tight, gets zapping sensation.  ? Cervical Extension 40   ? Cervical - Right Rotation 45   ? Cervical - Left Rotation 45   ?  ? Strength  ? Overall Strength Deficits   ? Overall Strength Comments tested in sitting   ? Strength Assessment Site Shoulder;Elbow;Wrist;Hand   ? Right/Left Shoulder Right;Left   ? Right Shoulder Flexion 4+/5   pain  ? Right Shoulder ABduction 5/5   ? Right Shoulder Internal Rotation 4+/5   ? Right Shoulder External Rotation 5/5   ? Left Shoulder Flexion 5/5   ? Left Shoulder ABduction 5/5   ? Left Shoulder Internal Rotation 4+/5   ? Left Shoulder  External Rotation 5/5   ? Right/Left Elbow Right;Left   ? Right Elbow Flexion 4+/5   ? Right Elbow Extension 4/5   ? Left Elbow Flexion 5/5   ? Left  Elbow Extension 5/5   ? Right/Left Wrist Right;Left   ? Right Wrist Flexion 5/5   ? Right Wrist Extension 5/5   ? Left Wrist Flexion 5/5   ? Left Wrist Extension 5/5   ? Right/Left hand Right;Left   ? Right Hand Gross Grasp Functional   ? Left Hand Gross Grasp Functional   ?  ? Palpation  ? Spinal mobility hypomobility throughout cervical spine   ? Palpation comment Tenderness/trigger points bil UT, levator scapulae, cervical paraspinals   ? ?  ?  ? ?  ? ? ? ? ? ? ? ? ? ? ? ? ? ?Objective measurements completed on examination: See above findings.  ? ? ? ? ? [facility] Adult PT Treatment/Exercise - 12/29/21 0001   ? ?  ? Manual Therapy  ? Manual Therapy Soft tissue mobilization;Other (comment)   ? Manual therapy comments to decrease muscle spasm and pain and improve cervical ROM   ? Soft tissue mobilization STM to cervical paraspinals   ? Other Manual Therapy skilled palpation and monitoring with dry needling.   ? ?  ?  ? ?  ? ? ? Trigger Point Dry Needling - 12/29/21 0001   ? ? Consent Given? Yes   ? Education Handout Provided Yes   ? Muscles Treated Head and Neck Cervical multifidi   ? Dry Needling Comments bil C4-6   ? Cervical multifidi Response Twitch reponse elicited;Palpable increased muscle length   ? ?  ?  ? ?  ? ? ? ? ? ? ? ? PT Education - 12/29/21 1155   ? ? Education Details educated on findings, POC, and dry needling.   ? Person(s) Educated Patient   ? Methods Explanation;Handout   ? Comprehension Verbalized understanding   ? ?  ?  ? ?  ? ? ? PT Short Term Goals - 12/29/21 1211   ? ?  ? PT SHORT TERM GOAL #1  ? Title Ind with initial HEP   ? Time 2   ? Period Weeks   ? Status New   ? Target Date 01/12/22   ? ?  ?  ? ?  ? ? ? ? PT Long Term Goals - 12/29/21 1211   ? ?  ? PT LONG TERM GOAL #1  ? Title Ind with progressed HEP to improve posture and outcomes.   ?  Time 6   ? Period Weeks   ? Status New   ? Target Date 02/09/22   ?  ? PT LONG TERM GOAL #2  ? Title Pt. will demonstrate improved cervical AROM with 60 deg without pain all directions for safety with drivin

## 2021-12-29 NOTE — Patient Instructions (Signed)

## 2022-01-02 ENCOUNTER — Ambulatory Visit: Payer: 59 | Admitting: Physical Therapy

## 2022-01-02 ENCOUNTER — Encounter: Payer: Self-pay | Admitting: Physical Therapy

## 2022-01-02 DIAGNOSIS — M542 Cervicalgia: Secondary | ICD-10-CM

## 2022-01-02 DIAGNOSIS — M6281 Muscle weakness (generalized): Secondary | ICD-10-CM

## 2022-01-02 DIAGNOSIS — R252 Cramp and spasm: Secondary | ICD-10-CM

## 2022-01-02 DIAGNOSIS — R293 Abnormal posture: Secondary | ICD-10-CM

## 2022-01-02 NOTE — Therapy (Signed)
?[facility] ?[address]  [address] ?[place], [place], [address] ?Phone: [phone]   Fax:  [phone] ? ?Physical Therapy Treatment ? ?Patient Details  ?Name: [facility] ?MRN: [id number] ?Date of Birth: 16-Sep-1976 ?Referring Provider (PT): Jene Every MD ? ? ?Encounter Date: 01/02/2022 ? ? PT End of Session - 01/02/22 1619   ? ? Visit Number 2   ? Number of Visits 12   ? Date for PT Re-Evaluation 02/09/22   ? Authorization Type UHC   ? Progress Note Due on Visit 10   ? PT Start Time 1617   ? PT Stop Time 1702   ? PT Time Calculation (min) 45 min   ? Activity Tolerance Patient tolerated treatment well   ? Behavior During Therapy [facility] for tasks assessed/performed   ? ?  ?  ? ?  ? ? ?Past Medical History:  ?Diagnosis Date  ? Acute right lower quadrant pain 01/25/2016  ? ADD (attention deficit disorder) 02/02/2015  ? Allergy   ? seasonal allergies  ? Anxiety   ? Depression with anxiety 03/15/2016  ? Endometriosis   ? Gallstones   ? Hyperlipidemia, mixed 02/02/2015  ? Migraine 12/16/2015  ? Preventative health care 12/16/2015  ? Pulmonary emboli (HCC)   ? b/l PE after hysterectomy, 5 weeks post op, clotting problem ruled out at [facility]  ? ? ?Past Surgical History:  ?Procedure Laterality Date  ? ABDOMINAL HYSTERECTOMY    ? ovaries removed, in 2 separate procedures  ? CESAREAN SECTION  2007, 2009  ? CHOLECYSTECTOMY    ? CYSTOSCOPY  10/2014  ? EYE SURGERY    ? lasik b/l  ? LAPAROTOMY    ? Endometriosis  ? WISDOM TOOTH EXTRACTION    ? ? ?There were no vitals filed for this visit. ? ? Subjective Assessment - 01/02/22 1716   ? ? Subjective Patient reports she was sore following DN, but applied CP as recommended and felt much better after.  Neck is moving a lot better, feels pain more in R UT today.  She was even able to drive without turning whole body to check side mirrors.   ? Pertinent History MRI of the cervical spine demonstrates disc bulging osteophyte complex  C5-6 paracentral to the left with moderately severe left facet arthrosis. Severe facet arthrosis on the right C7 and T1 with moderately severe foraminal narrowing displacing the exiting right C8 nerve root. Moderately severe bilateral foraminal narrowing at C3-4. Since her previous MRI perhaps some mild progression in the foraminal stenosis at C5-6 on the left     1. Acute cervical strain hyperextension with radiculopathy C8 nerve root distribution. Foraminal stenosis on the right. No evidence of fracture or instability   2. Bilateral cubital tunnel syndrome   3. 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 Facial dysesthesias predating the injury   ? Diagnostic tests MRI   ? Patient Stated Goals release tension in upper back   ? Currently in Pain? Yes   ? Pain Score 4    ? Pain Location Neck   ? Pain Orientation Right   ? Pain Onset 1 to 4 weeks ago   ? ?  ?  ? ?  ? ? ? ? ? ? ? ? ? ? ? ? ? ? ? ? ? ? ? ? [facility] Adult PT Treatment/Exercise - 01/02/22 0001   ? ?  ? Exercises  ? Exercises Neck   ?  ?  Neck Exercises: Machines for Strengthening  ? UBE (Upper Arm Bike) L1 x 6 min (66f/3b)   ?  ? Neck Exercises: Seated  ? Neck Retraction 10 reps   ? Shoulder Rolls Backwards;10 reps   ? Other Seated Exercise scap squeezes 10 x 5 sec hold   ?  ? Neck Exercises: Stretches  ? Upper Trapezius Stretch Limitations demo   ? Levator Stretch Limitations demo   ? Other Neck Stretches self snags with pillow case into extension and rotation   ?  ? Manual Therapy  ? Manual Therapy Joint mobilization;Soft tissue mobilization;Myofascial release;Other (comment)   ? Manual therapy comments to decrease muscle spasm and pain and improve cervical ROM   ? Joint Mobilization PA mobs cervical spine grade 3-4   ? Soft tissue mobilization STM to cervical paraspinals   ? Myofascial Release TPR to bil UT   ? Other Manual Therapy skilled palpation and monitoring with dry needling.   ? ?  ?  ? ?  ? ? ? Trigger Point Dry Needling - 01/02/22  0001   ? ? Consent Given? Yes   ? Education Handout Provided Previously provided   ? Muscles Treated Head and Neck Upper trapezius   ? Dry Needling Comments bil   ? Upper Trapezius Response Twitch reponse elicited;Palpable increased muscle length   ? ?  ?  ? ?  ? ? ? ? ? ? ? ? PT Education - 01/02/22 1705   ? ? Education Details Access Code: [email or web address]  Initial HEP   ? Person(s) Educated Patient   ? Methods Explanation;Demonstration;Verbal cues;Handout   ? Comprehension Verbalized understanding;Returned demonstration   ? ?  ?  ? ?  ? ? ? PT Short Term Goals - 01/02/22 1620   ? ?  ? PT SHORT TERM GOAL #1  ? Title Ind with initial HEP   ? Time 2   ? Period Weeks   ? Status On-going   ? Target Date 01/12/22   ? ?  ?  ? ?  ? ? ? ? PT Long Term Goals - 01/02/22 1620   ? ?  ? PT LONG TERM GOAL #1  ? Title Ind with progressed HEP to improve posture and outcomes.   ? Time 6   ? Period Weeks   ? Status On-going   ? Target Date 02/09/22   ?  ? PT LONG TERM GOAL #2  ? Title Pt. will demonstrate improved cervical AROM with 60 deg without pain all directions for safety with driving.   ? Baseline see flowsheet, very slow gaurded painful movements.   ? Time 6   ? Period Weeks   ? Status On-going   ? Target Date 02/09/22   ?  ? PT LONG TERM GOAL #3  ? Title Pt. will demonstrate improved functional ability as demonstrated by 60% on FOTO.   ? Baseline 34% on FOTO = 66% disability   ? Time 6   ? Period Weeks   ? Status On-going   ? Target Date 02/09/22   ?  ? PT LONG TERM GOAL #4  ? Title Pt. will report 75% improvement in neck/upper back pain to improve QOL   ? Time 6   ? Period Weeks   ? Status On-going   ? Target Date 02/09/22   ? ?  ?  ? ?  ? ? ? ? ? ? ? ? Plan - 01/02/22 1706   ? ? Clinical Impression Statement "Whitney Trujillo"  reported soreness following dry needling first session, but overall significant improvement, especially with driving.  Today reviewed initial HEP for postural strengthening and gentle stretches/self snags, followed  by manual therapy and DN to bil UT.  She still had sympathetic response but not as severe as first visit, just flushing today, and noted significant improvement in spasms in neck today.  She would benefit from continued skilled therapy.   ? Personal Factors and Comorbidities Comorbidity 3+;Profession   ? Comorbidities arthritis, psoriasis, chronic neck pain, carpal tunnel syndrome   ? Examination-Activity Limitations Bathing;Carry;Lift;Reach Overhead;Dressing;Caring for Others   ? Examination-Participation Restrictions Driving;Cleaning;Meal Prep;Community Activity;Laundry;Occupation;Shop   ? Stability/Clinical Decision Making Evolving/Moderate complexity   ? Rehab Potential Good   ? PT Frequency 2x / week   ? PT Duration 6 weeks   ? PT Treatment/Interventions ADLs/Self Care Home Management;Cryotherapy;Electrical Stimulation;Moist Heat;Traction;Ultrasound;Functional mobility training;Therapeutic activities;Therapeutic exercise;Balance training;Neuromuscular re-education;Patient/family education;Manual techniques;Passive range of motion;Dry needling;Spinal Manipulations;Joint Manipulations;Taping   ? PT Next Visit Plan HEP for postural strengthening, modalities/manual therapy PRN   ? Consulted and Agree with Plan of Care Patient   ? ?  ?  ? ?  ? ? ?Patient will benefit from skilled therapeutic intervention in order to improve the following deficits and impairments:  Decreased activity tolerance, Decreased range of motion, Decreased strength, Dizziness, Hypomobility, Increased fascial restricitons, Impaired UE functional use, Pain, Postural dysfunction, Impaired flexibility, Increased muscle spasms, Decreased mobility ? ?Visit Diagnosis: ?Cervicalgia ? ?Muscle weakness (generalized) ? ?Abnormal posture ? ?Cramp and spasm ? ? ? ? ?Problem List ?Patient Active Problem List  ? Diagnosis Date Noted  ? Gross hematuria 05/03/2021  ? Flank pain 05/03/2021  ? Hyperglycemia 11/10/2020  ? Psoriasis 11/09/2020  ? Arthritis  07/05/2020  ? Bilateral occipital neuralgia 05/04/2020  ? Exertional headache 05/04/2020  ? Migraine without aura and without status migrainosus, not intractable 05/04/2020  ? History of COVID-19 02/24/2020  ? B

## 2022-01-02 NOTE — Patient Instructions (Signed)
Access Code: [email or web address] ?URL: [email or web address] ?Date: 01/02/2022 ?Prepared by: Glenetta Hew ? ?Exercises ?- Seated Scapular Retraction  - 1 x daily - 7 x weekly - 2 sets - 10 reps ?- Seated Cervical Retraction  - 1 x daily - 7 x weekly - 2 sets - 10 reps ?- Mid-Lower Cervical Extension SNAG with Strap  - 1 x daily - 7 x weekly - 2 sets - 10 reps ?- Seated Assisted Cervical Rotation with Towel  - 1 x daily - 7 x weekly - 2 sets - 10 reps ?- Gentle Levator Scapulae Stretch  - 1 x daily - 7 x weekly - 1 sets - 3 reps - 15 sec hold ?- Upper Trapezius Stretch  - 1 x daily - 7 x weekly - 1 sets - 3 reps - 15 sec  hold ?

## 2022-01-03 ENCOUNTER — Telehealth: Payer: Self-pay | Admitting: *Deleted

## 2022-01-03 NOTE — Telephone Encounter (Signed)
Pt mr cervical spine cd and report in nurse pod. ?

## 2022-01-04 ENCOUNTER — Other Ambulatory Visit (HOSPITAL_BASED_OUTPATIENT_CLINIC_OR_DEPARTMENT_OTHER): Payer: Self-pay | Admitting: Family Medicine

## 2022-01-04 DIAGNOSIS — Z1231 Encounter for screening mammogram for malignant neoplasm of breast: Secondary | ICD-10-CM

## 2022-01-06 ENCOUNTER — Encounter: Payer: Self-pay | Admitting: Physical Therapy

## 2022-01-06 ENCOUNTER — Ambulatory Visit: Payer: 59 | Admitting: Physical Therapy

## 2022-01-06 DIAGNOSIS — R252 Cramp and spasm: Secondary | ICD-10-CM

## 2022-01-06 DIAGNOSIS — M542 Cervicalgia: Secondary | ICD-10-CM

## 2022-01-06 DIAGNOSIS — M6281 Muscle weakness (generalized): Secondary | ICD-10-CM

## 2022-01-06 DIAGNOSIS — R293 Abnormal posture: Secondary | ICD-10-CM

## 2022-01-06 NOTE — Patient Instructions (Signed)
Access Code: [email or web address] ?URL: [email or web address] ?Date: 01/06/2022 ?Prepared by: Harrie Foreman ? ?Exercises ?- Seated Scapular Retraction  - 1 x daily - 7 x weekly - 2 sets - 10 reps ?- Seated Cervical Retraction  - 1 x daily - 7 x weekly - 2 sets - 10 reps ?- Mid-Lower Cervical Extension SNAG with Strap  - 1 x daily - 7 x weekly - 2 sets - 10 reps ?- Seated Assisted Cervical Rotation with Towel  - 1 x daily - 7 x weekly - 2 sets - 10 reps ?- Gentle Levator Scapulae Stretch  - 1 x daily - 7 x weekly - 1 sets - 3 reps - 15 sec hold ?- Upper Trapezius Stretch  - 1 x daily - 7 x weekly - 1 sets - 3 reps - 15 sec  hold ?- Sidelying Open Book Thoracic Lumbar Rotation and Extension  - 1 x daily - 7 x weekly - 2 sets - 10 reps ?- Supine Chest Stretch on Foam Roll  - 1 x daily - 7 x weekly - 1 sets ?- Thoracic Extension Mobilization on Foam Roll  - 1 x daily - 7 x weekly - 10 reps ?- Thoracic Mobilization on Foam Roll  - 1 x daily - 7 x weekly - 10 reps ?

## 2022-01-06 NOTE — Therapy (Signed)
[place] ?[facility] ?[address]  [address] ?[place], [place], [address] ?Phone: [phone]   Fax:  [phone] ? ?Physical Therapy Treatment ? ?Patient Details  ?Name: Whitney [facility]illo [facility] ?MRN: [id number] ?Date of Birth: Dec 24, 1975 ?Referring Provider (PT): Jene Every MD ? ? ?Encounter Date: 01/06/2022 ? ? PT End of Session - 01/06/22 0804   ? ? Visit Number 3   ? Number of Visits 12   ? Date for PT Re-Evaluation 02/09/22   ? Authorization Type UHC   ? Progress Note Due on Visit 10   ? PT Start Time 0802   ? PT Stop Time 0858   ? PT Time Calculation (min) 56 min   ? Activity Tolerance Patient tolerated treatment well   ? Behavior During Therapy [facility] for tasks assessed/performed   ? ?  ?  ? ?  ? ? ?Past Medical History:  ?Diagnosis Date  ? Acute right lower quadrant pain 01/25/2016  ? ADD (attention deficit disorder) 02/02/2015  ? Allergy   ? seasonal allergies  ? Anxiety   ? Depression with anxiety 03/15/2016  ? Endometriosis   ? Gallstones   ? Hyperlipidemia, mixed 02/02/2015  ? Migraine 12/16/2015  ? Preventative health care 12/16/2015  ? Pulmonary emboli (HCC)   ? b/l PE after hysterectomy, 5 weeks post op, clotting problem ruled out at [facility]  ? ? ?Past Surgical History:  ?Procedure Laterality Date  ? ABDOMINAL HYSTERECTOMY    ? ovaries removed, in 2 separate procedures  ? CESAREAN SECTION  2007, 2009  ? CHOLECYSTECTOMY    ? CYSTOSCOPY  10/2014  ? EYE SURGERY    ? lasik b/l  ? LAPAROTOMY    ? Endometriosis  ? WISDOM TOOTH EXTRACTION    ? ? ?There were no vitals filed for this visit. ? ? Subjective Assessment - 01/06/22 0804   ? ? Subjective Feeling better, but did get migraine day after last session.  No pain this morning, feeling more twinges going down her back now, not as numb.   ? Pertinent History MRI of the cervical spine demonstrates disc bulging osteophyte complex C5-6 paracentral to the left with moderately severe left facet arthrosis. Severe facet arthrosis on  the right C7 and T1 with moderately severe foraminal narrowing displacing the exiting right C8 nerve root. Moderately severe bilateral foraminal narrowing at C3-4. Since her previous MRI perhaps some mild progression in the foraminal stenosis at C5-6 on the left     1. Acute cervical strain hyperextension with radiculopathy C8 nerve root distribution. Foraminal stenosis on the right. No evidence of fracture or instability   2. Bilateral cubital tunnel syndrome   3. 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 Facial dysesthesias predating the injury   ? Diagnostic tests MRI   ? Patient Stated Goals release tension in upper back   ? Currently in Pain? No/denies   4/10 yesterday  ? Pain Onset 1 to 4 weeks ago   ? ?  ?  ? ?  ? ? ? ? ? ? ? ? ? ? ? ? ? ? ? ? ? ? ? ? [facility] Adult PT Treatment/Exercise - 01/06/22 0001   ? ?  ? Neck Exercises: Machines for Strengthening  ? Nustep L5 x 6 min   ?  ? Neck Exercises: Supine  ? Other Supine Exercise thoracic mobs on foam roller   ? Other Supine Exercise chest stretch on foam roller   ?  ?  Neck Exercises: Sidelying  ? Other Sidelying Exercise open books x 10 bil for thoracic mobilization   ?  ? Neck Exercises: Prone  ? Other Prone Exercise cat cows quadruped x 10   ?  ? Neck Exercises: Stretches  ? Other Neck Stretches scalene stretch   ?  ? Manual Therapy  ? Manual Therapy Joint mobilization;Soft tissue mobilization;Myofascial release   ? Manual therapy comments to decrease muscle spasm and pain and improve cervical ROM   ? Joint Mobilization PA mobs cervical spine grade 3-4, NAGs into rotation   ? Soft tissue mobilization STM to cervical paraspinals   ? Myofascial Release suboccipital release, TPR bil UT   ? Other Manual Therapy skilled palpation and monitoring with dry needling.   ? ?  ?  ? ?  ? ? ? Trigger Point Dry Needling - 01/06/22 0001   ? ? Consent Given? Yes   ? Education Handout Provided Previously provided   ? Muscles Treated Head and Neck Upper  trapezius;Cervical multifidi   ? Dry Needling Comments bil C3-6, L UT   ? Upper Trapezius Response Twitch reponse elicited;Palpable increased muscle length   ? Cervical multifidi Response Twitch reponse elicited;Palpable increased muscle length   ? ?  ?  ? ?  ? ? ? ? ? ? ? ? PT Education - 01/06/22 0909   ? ? Education Details HEP update; recommendations where to purchase inexpensive foam roller   ? Person(s) Educated Patient   ? Methods Explanation;Demonstration;Verbal cues;Handout   ? Comprehension Verbalized understanding;Returned demonstration   ? ?  ?  ? ?  ? ? ? PT Short Term Goals - 01/06/22 0905   ? ?  ? PT SHORT TERM GOAL #1  ? Title Ind with initial HEP   ? Time 2   ? Period Weeks   ? Status Achieved   ? Target Date 01/12/22   ? ?  ?  ? ?  ? ? ? ? PT Long Term Goals - 01/02/22 1620   ? ?  ? PT LONG TERM GOAL #1  ? Title Ind with progressed HEP to improve posture and outcomes.   ? Time 6   ? Period Weeks   ? Status On-going   ? Target Date 02/09/22   ?  ? PT LONG TERM GOAL #2  ? Title Pt. will demonstrate improved cervical AROM with 60 deg without pain all directions for safety with driving.   ? Baseline see flowsheet, very slow gaurded painful movements.   ? Time 6   ? Period Weeks   ? Status On-going   ? Target Date 02/09/22   ?  ? PT LONG TERM GOAL #3  ? Title Pt. will demonstrate improved functional ability as demonstrated by 60% on FOTO.   ? Baseline 34% on FOTO = 66% disability   ? Time 6   ? Period Weeks   ? Status On-going   ? Target Date 02/09/22   ?  ? PT LONG TERM GOAL #4  ? Title Pt. will report 75% improvement in neck/upper back pain to improve QOL   ? Time 6   ? Period Weeks   ? Status On-going   ? Target Date 02/09/22   ? ?  ?  ? ?  ? ? ? ? ? ? ? ? Plan - 01/06/22 0903   ? ? Clinical Impression Statement [name] reports overall improvement, but still a lot of tightness, having migraines after dry needling.  Progressed  HEP today focusing on thoracic mobilization, followed by manual therapy to  decrease spasm and improve cervical ROM.  She tolerated DN better today, and reported decreased tightness following interventions.  She would benefit from continued skilled therapy.   ? Personal Factors and Comorbidities Comorbidity 3+;Profession   ? Comorbidities arthritis, psoriasis, chronic neck pain, carpal tunnel syndrome   ? Examination-Activity Limitations Bathing;Carry;Lift;Reach Overhead;Dressing;Caring for Others   ? Examination-Participation Restrictions Driving;Cleaning;Meal Prep;Community Activity;Laundry;Occupation;Shop   ? Stability/Clinical Decision Making Evolving/Moderate complexity   ? Rehab Potential Good   ? PT Frequency 2x / week   ? PT Duration 6 weeks   ? PT Treatment/Interventions ADLs/Self Care Home Management;Cryotherapy;Electrical Stimulation;Moist Heat;Traction;Ultrasound;Functional mobility training;Therapeutic activities;Therapeutic exercise;Balance training;Neuromuscular re-education;Patient/family education;Manual techniques;Passive range of motion;Dry needling;Spinal Manipulations;Joint Manipulations;Taping   ? PT Next Visit Plan HEP for postural strengthening, modalities/manual therapy PRN   ? Consulted and Agree with Plan of Care Patient   ? ?  ?  ? ?  ? ? ?Patient will benefit from skilled therapeutic intervention in order to improve the following deficits and impairments:  Decreased activity tolerance, Decreased range of motion, Decreased strength, Dizziness, Hypomobility, Increased fascial restricitons, Impaired UE functional use, Pain, Postural dysfunction, Impaired flexibility, Increased muscle spasms, Decreased mobility ? ?Visit Diagnosis: ?Cervicalgia ? ?Muscle weakness (generalized) ? ?Abnormal posture ? ?Cramp and spasm ? ? ? ? ?Problem List ?Patient Active Problem List  ? Diagnosis Date Noted  ? Gross hematuria 05/03/2021  ? Flank pain 05/03/2021  ? Hyperglycemia 11/10/2020  ? Psoriasis 11/09/2020  ? Arthritis 07/05/2020  ? Bilateral occipital neuralgia 05/04/2020  ?  Exertional headache 05/04/2020  ? Migraine without aura and without status migrainosus, not intractable 05/04/2020  ? History of COVID-19 02/24/2020  ? Bilateral carpal tunnel syndrome 10/19/2017  ? Colon cancer [place]

## 2022-01-09 ENCOUNTER — Ambulatory Visit: Payer: 59 | Admitting: Physical Therapy

## 2022-01-09 ENCOUNTER — Encounter: Payer: Self-pay | Admitting: Physical Therapy

## 2022-01-09 DIAGNOSIS — R293 Abnormal posture: Secondary | ICD-10-CM

## 2022-01-09 DIAGNOSIS — M542 Cervicalgia: Secondary | ICD-10-CM

## 2022-01-09 DIAGNOSIS — R252 Cramp and spasm: Secondary | ICD-10-CM

## 2022-01-09 DIAGNOSIS — M6281 Muscle weakness (generalized): Secondary | ICD-10-CM

## 2022-01-09 NOTE — Therapy (Signed)
[place] ?[address]  [address] ?[place], [place], [address] ?Phone: [phone]   Fax:  [phone] ? ?Physical Therapy Treatment ? ?Patient Details  ?Name: Whitney Trujillo [facility] ?MRN: [id number] ?Date of Birth: 1975/12/06 ?Referring Provider (PT): Jene Every MD ? ? ?Encounter Date: 01/09/2022 ? ? PT End of Session - 01/09/22 6301   ? ? Visit Number 4   ? Number of Visits 12   ? Date for PT Re-Evaluation 02/09/22   ? Authorization Type UHC   ? Progress Note Due on Visit 10   ? PT Start Time [phone]   ? PT Stop Time 0845   ? PT Time Calculation (min) 34 min   ? Activity Tolerance Patient tolerated treatment well   ? Behavior During Therapy [facility] for tasks assessed/performed   ? ?  ?  ? ?  ? ? ?Past Medical History:  ?Diagnosis Date  ? Acute right lower quadrant pain 01/25/2016  ? ADD (attention deficit disorder) 02/02/2015  ? Allergy   ? seasonal allergies  ? Anxiety   ? Depression with anxiety 03/15/2016  ? Endometriosis   ? Gallstones   ? Hyperlipidemia, mixed 02/02/2015  ? Migraine 12/16/2015  ? Preventative health care 12/16/2015  ? Pulmonary emboli (HCC)   ? b/l PE after hysterectomy, 5 weeks post op, clotting problem ruled out at [facility]  ? ? ?Past Surgical History:  ?Procedure Laterality Date  ? ABDOMINAL HYSTERECTOMY    ? ovaries removed, in 2 separate procedures  ? CESAREAN SECTION  2007, 2009  ? CHOLECYSTECTOMY    ? CYSTOSCOPY  10/2014  ? EYE SURGERY    ? lasik b/l  ? LAPAROTOMY    ? Endometriosis  ? WISDOM TOOTH EXTRACTION    ? ? ?There were no vitals filed for this visit. ? ? Subjective Assessment - 01/09/22 0847   ? ? Subjective Pt reported severe pain in shoulder blades this weekend after work on Saturday, had to take gabapentin.  Got a foam roller and has been using.   ? Pertinent History MRI of the cervical spine demonstrates disc bulging osteophyte complex C5-6 paracentral to the left with moderately severe left facet arthrosis. Severe facet arthrosis on  the right C7 and T1 with moderately severe foraminal narrowing displacing the exiting right C8 nerve root. Moderately severe bilateral foraminal narrowing at C3-4. Since her previous MRI perhaps some mild progression in the foraminal stenosis at C5-6 on the left     1. Acute cervical strain hyperextension with radiculopathy C8 nerve root distribution. Foraminal stenosis on the right. No evidence of fracture or instability   2. Bilateral cubital tunnel syndrome   3. 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 Facial dysesthesias predating the injury   ? Diagnostic tests MRI   ? Patient Stated Goals release tension in upper back   ? Currently in Pain? Yes   ? Pain Score 3    ? Pain Location Neck   ? Pain Onset 1 to 4 weeks ago   ? ?  ?  ? ?  ? ? ? ? ? ? ? ? ? ? ? ? ? ? ? ? ? ? ? ? [facility] Adult PT Treatment/Exercise - 01/09/22 0001   ? ?  ? Neck Exercises: Machines for Strengthening  ? UBE (Upper Arm Bike) L1 x 6 min (52f/3b)   ?  ? Neck Exercises: Standing  ? Other Standing Exercises standing open books x  10 bil - noted significant tightness on L side.   ?  ? Manual Therapy  ? Manual Therapy Joint mobilization;Soft tissue mobilization;Myofascial release;Other (comment)   ? Manual therapy comments to decrease muscle spasm and pain and improve cervical ROM   ? Joint Mobilization PA mobs cervical spine and upper thoracic grade 3-4, cervical NAGs into rotation   ? Soft tissue mobilization STM to cervical paraspinals   ? Myofascial Release TPR bil levator scapulae   ? Other Manual Therapy skilled palpation and monitoring with dry needling.   ? ?  ?  ? ?  ? ? ? [place] Dry Needling - 01/09/22 0001   ? ? Consent Given? Yes   ? Education Handout Provided Previously provided   ? Muscles Treated Head and Neck Levator scapulae   ? Dry Needling Comments bil   ? Levator Scapulae Response Twitch response elicited;Palpable increased muscle length   ? ?  ?  ? ?  ? ? ? ? ? ? ? ? ? ? PT Short Term Goals - 01/06/22 0905    ? ?  ? PT SHORT TERM GOAL #1  ? Title Ind with initial HEP   ? Time 2   ? Period Weeks   ? Status Achieved   ? Target Date 01/12/22   ? ?  ?  ? ?  ? ? ? ? PT Long Term Goals - 01/02/22 1620   ? ?  ? PT LONG TERM GOAL #1  ? Title Ind with progressed HEP to improve posture and outcomes.   ? Time 6   ? Period Weeks   ? Status On-going   ? Target Date 02/09/22   ?  ? PT LONG TERM GOAL #2  ? Title Pt. will demonstrate improved cervical AROM with 60 deg without pain all directions for safety with driving.   ? Baseline see flowsheet, very slow gaurded painful movements.   ? Time 6   ? Period Weeks   ? Status On-going   ? Target Date 02/09/22   ?  ? PT LONG TERM GOAL #3  ? Title Pt. will demonstrate improved functional ability as demonstrated by 60% on FOTO.   ? Baseline 34% on FOTO = 66% disability   ? Time 6   ? Period Weeks   ? Status On-going   ? Target Date 02/09/22   ?  ? PT LONG TERM GOAL #4  ? Title Pt. will report 75% improvement in neck/upper back pain to improve QOL   ? Time 6   ? Period Weeks   ? Status On-going   ? Target Date 02/09/22   ? ?  ?  ? ?  ? ? ? ? ? ? ? ? Plan - 01/09/22 0848   ? ? Clinical Impression Statement Whitney Trujillo is still having a lot of pain when working.  Today focused on bil levator scapulae where she demonstrates trigger points and pain.  Reported decreased pain and spasm following session.  Would benefit from continued skilled therapy.   ? Personal Factors and Comorbidities Comorbidity 3+;Profession   ? Comorbidities arthritis, psoriasis, chronic neck pain, carpal tunnel syndrome   ? Examination-Activity Limitations Bathing;Carry;Lift;Reach Overhead;Dressing;Caring for Others   ? Examination-Participation Restrictions Driving;Cleaning;Meal Prep;Community Activity;Laundry;Occupation;Shop   ? Stability/Clinical Decision Making Evolving/Moderate complexity   ? Rehab Potential Good   ? PT Frequency 2x / week   ? PT Duration 6 weeks   ? PT Treatment/Interventions ADLs/Self Care Home  Management;Cryotherapy;Electrical Stimulation;Moist Heat;Traction;Ultrasound;Functional mobility  training;Therapeutic activities;Therapeutic exercise;Balance training;Neuromuscular re-education;Patient/family education;Manual techniques;Passive range of motion;Dry needling;Spinal Manipulations;Joint Manipulations;Taping   ? PT Next Visit Plan HEP for postural strengthening, modalities/manual therapy PRN   ? Consulted and Agree with Plan of Care Patient   ? ?  ?  ? ?  ? ? ?Patient will benefit from skilled therapeutic intervention in order to improve the following deficits and impairments:  Decreased activity tolerance, Decreased range of motion, Decreased strength, Dizziness, Hypomobility, Increased fascial restricitons, Impaired UE functional use, Pain, Postural dysfunction, Impaired flexibility, Increased muscle spasms, Decreased mobility ? ?Visit Diagnosis: ?Cervicalgia ? ?Muscle weakness (generalized) ? ?Abnormal posture ? ?Cramp and spasm ? ? ? ? ?Problem List ?Patient Active Problem List  ? Diagnosis Date Noted  ? Gross hematuria 05/03/2021  ? Flank pain 05/03/2021  ? Hyperglycemia 11/10/2020  ? Psoriasis 11/09/2020  ? Arthritis 07/05/2020  ? Bilateral occipital neuralgia 05/04/2020  ? Exertional headache 05/04/2020  ? Migraine without aura and without status migrainosus, not intractable 05/04/2020  ? History of COVID-19 02/24/2020  ? Bilateral carpal tunnel syndrome 10/19/2017  ? Colon cancer screening 12/16/2015  ? Hyperlipidemia, mixed 02/02/2015  ? Vitamin D deficiency 02/02/2015  ? ADD (attention deficit disorder) 02/02/2015  ? Allergy   ? Endometriosis 07/07/2014  ? Acute pulmonary embolism  5 wks post hysterectomy 07/07/2014  ? ? ?Jena GaussElizabeth J Javionna Leder, PT, DPT  ?01/09/2022, 8:57 AM ? ?[place] [facility] ?[address]  [address] ?[place], [place], [address] ?Phone: [phone]   Fax:  [phone] ? ?Name: Bri[facility]issa [facility] ?MRN: [id number] ?Date of Birth:  09-Oct-1975 ? ? ? ?

## 2022-01-12 ENCOUNTER — Encounter: Payer: Self-pay | Admitting: Physical Therapy

## 2022-01-12 ENCOUNTER — Ambulatory Visit: Payer: 59 | Admitting: Physical Therapy

## 2022-01-12 DIAGNOSIS — R293 Abnormal posture: Secondary | ICD-10-CM

## 2022-01-12 DIAGNOSIS — M6281 Muscle weakness (generalized): Secondary | ICD-10-CM

## 2022-01-12 DIAGNOSIS — R252 Cramp and spasm: Secondary | ICD-10-CM

## 2022-01-12 DIAGNOSIS — M542 Cervicalgia: Secondary | ICD-10-CM | POA: Diagnosis not present

## 2022-01-12 NOTE — Therapy (Signed)
[place] [facility] ?[place] ?[place], [place], [address] ?Phone: [phone]   Fax:  [phone] ? ?Physical Therapy Treatment ? ?Patient Details  ?Name: Whitney Trujillo [facility] ?MRN: [id number] ?Date of Birth: [birth year] ?Referring Provider (PT): Susa Day MD ? ? ?Encounter Date: 01/12/2022 ? ? PT End of Session - 01/12/22 1020   ? ? Visit Number 5   ? Number of Visits 12   ? Date for PT Re-Evaluation 02/09/22   ? Authorization Type UHC   ? Progress Note Due on Visit 10   ? PT Start Time 1018   ? PT Stop Time 1116   ? PT Time Calculation (min) 58 min   ? Activity Tolerance Patient tolerated treatment well   ? Behavior During Therapy [facility] for tasks assessed/performed   ? ?  ?  ? ?  ? ? ?Past Medical History:  ?Diagnosis Date  ? Acute right lower quadrant pain 01/25/2016  ? ADD (attention deficit disorder) 02/02/2015  ? Allergy   ? seasonal allergies  ? Anxiety   ? Depression with anxiety 03/15/2016  ? Endometriosis   ? Gallstones   ? Hyperlipidemia, mixed 02/02/2015  ? Migraine 12/16/2015  ? Preventative health care 12/16/2015  ? Pulmonary emboli (HCC)   ? b/l PE after hysterectomy, 5 weeks post op, clotting problem ruled out at [facility]  ? ? ?Past Surgical History:  ?Procedure Laterality Date  ? ABDOMINAL HYSTERECTOMY    ? ovaries removed, in 2 separate procedures  ? CESAREAN SECTION  2007, 2009  ? CHOLECYSTECTOMY    ? CYSTOSCOPY  10/2014  ? EYE SURGERY    ? lasik b/l  ? LAPAROTOMY    ? Endometriosis  ? WISDOM TOOTH EXTRACTION    ? ? ?There were no vitals filed for this visit. ? ? Subjective Assessment - 01/12/22 1019   ? ? Subjective Pt. reports neck is doing better but still very tight.  Has been doing 2 colors a day, which still hurts.   ? Pertinent History MRI of the cervical spine demonstrates disc bulging osteophyte complex C5-6 paracentral to the left with moderately severe left facet arthrosis. Severe facet arthrosis on the right C7 and T1 with moderately  severe foraminal narrowing displacing the exiting right C8 nerve root. Moderately severe bilateral foraminal narrowing at C3-4. Since her previous MRI perhaps some mild progression in the foraminal stenosis at C5-6 on the left     1. Acute cervical strain hyperextension with radiculopathy C8 nerve root distribution. Foraminal stenosis on the right. No evidence of fracture or instability   2. Bilateral cubital tunnel syndrome   3. 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 Facial dysesthesias predating the injury   ? Diagnostic tests MRI   ? Patient Stated Goals release tension in upper back   ? Currently in Pain? Yes   ? Pain Score 3    when working goes up to 7/10  ? Pain Location Neck   ? Pain Onset 1 to 4 weeks ago   ? ?  ?  ? ?  ? ? ? ? ? ? ? ? ? ? ? ? ? ? ? ? ? ? ? ? [facility] Adult PT Treatment/Exercise - 01/12/22 0001   ? ?  ? Neck Exercises: Machines for Strengthening  ? UBE (Upper Arm Bike) L1 x 6 min (53f/3b)   ?  ? Neck Exercises: Theraband  ? Rows 20 reps;Red   ?  Shoulder External Rotation 20 reps;Red   ? Shoulder External Rotation Limitations bil in doorway   ? Horizontal ADduction 10 reps;Red   ? Other Theraband Exercises bil shoulder extension RTB 2 x 10   ?  ? Neck Exercises: Stretches  ? Corner Stretch 2 reps;10 seconds   ? Corner Stretch Limitations tolerated better than doorway stretch, reported tingling in hands with doorway stretch but not with corner stretch   ?  ? Manual Therapy  ? Manual Therapy Joint mobilization;Soft tissue mobilization;Other (comment)   ? Manual therapy comments to decrease muscle spasm and pain and improve cervical ROM   ? Joint Mobilization PA mobs cervical spine and upper thoracic grade 3-4, cervical NAGs into rotation   ? Soft tissue mobilization STM to cervical paraspinals   ? Other Manual Therapy skilled palpation and monitoring with dry needling.   ? ?  ?  ? ?  ? ? ? Tr[place] Dry Needling - 01/12/22 0001   ? ? Consent Given? Yes   ? Education Handout  Provided Previously provided   ? Muscles Treated Head and Neck Cervical multifidi   ? Dry Needling Comments bil C3-C7   ? Cervical multifidi Response Twitch reponse elicited;Palpable increased muscle length   ? ?  ?  ? ?  ? ? ? ? ? ? ? ? ? ? PT Short Term Goals - 01/06/22 0905   ? ?  ? PT SHORT TERM GOAL #1  ? Title Ind with initial HEP   ? Time 2   ? Period Weeks   ? Status Achieved   ? Target Date 01/12/22   ? ?  ?  ? ?  ? ? ? ? PT Long Term Goals - 01/02/22 1620   ? ?  ? PT LONG TERM GOAL #1  ? Title Ind with progressed HEP to improve posture and outcomes.   ? Time 6   ? Period Weeks   ? Status On-going   ? Target Date 02/09/22   ?  ? PT LONG TERM GOAL #2  ? Title Pt. will demonstrate improved cervical AROM with 60 deg without pain all directions for safety with driving.   ? Baseline see flowsheet, very slow gaurded painful movements.   ? Time 6   ? Period Weeks   ? Status On-going   ? Target Date 02/09/22   ?  ? PT LONG TERM GOAL #3  ? Title Pt. will demonstrate improved functional ability as demonstrated by 60% on FOTO.   ? Baseline 34% on FOTO = 66% disability   ? Time 6   ? Period Weeks   ? Status On-going   ? Target Date 02/09/22   ?  ? PT LONG TERM GOAL #4  ? Title Pt. will report 75% improvement in neck/upper back pain to improve QOL   ? Time 6   ? Period Weeks   ? Status On-going   ? Target Date 02/09/22   ? ?  ?  ? ?  ? ? ? ? ? ? ? ? Plan - 01/12/22 1122   ? ? Clinical Impression Statement [name] reports progress and good compliance with HEP, but still having spasms and pain in neck.  Noted significant hypomobility and continued decreased cervical lordosis.  Progressed HEP for periscapular strengthening today, then focused on manual therapy to improve cervical mobility, noted improved movement at end of session and patient reported decreased pain and spasm.  She would benefit from continued skilled therapy.   ?  Personal Factors and Comorbidities Comorbidity 3+;Profession   ? Comorbidities arthritis,  psoriasis, chronic neck pain, carpal tunnel syndrome   ? Examination-Activity Limitations Bathing;Carry;Lift;Reach Overhead;Dressing;Caring for Others   ? Examination-Participation Restrictions Driving;Cleaning;Meal Prep;Community Activity;Laundry;Occupation;Shop   ? Stability/Clinical Decision Making Evolving/Moderate complexity   ? Rehab Potential Good   ? PT Frequency 2x / week   ? PT Duration 6 weeks   ? PT Treatment/Interventions ADLs/Self Care Home Management;Cryotherapy;Electrical Stimulation;Moist Heat;Traction;Ultrasound;Functional mobility training;Therapeutic activities;Therapeutic exercise;Balance training;Neuromuscular re-education;Patient/family education;Manual techniques;Passive range of motion;Dry needling;Spinal Manipulations;Joint Manipulations;Taping   ? PT Next Visit Plan HEP for postural strengthening, modalities/manual therapy PRN   ? Consulted and Agree with Plan of Care Patient   ? ?  ?  ? ?  ? ? ?Patient will benefit from skilled therapeutic intervention in order to improve the following deficits and impairments:  Decreased activity tolerance, Decreased range of motion, Decreased strength, Dizziness, Hypomobility, Increased fascial restricitons, Impaired UE functional use, Pain, Postural dysfunction, Impaired flexibility, Increased muscle spasms, Decreased mobility ? ?Visit Diagnosis: ?Cervicalgia ? ?Muscle weakness (generalized) ? ?Abnormal posture ? ?Cramp and spasm ? ? ? ? ?Problem List ?Patient Active Problem List  ? Diagnosis Date Noted  ? Gross hematuria 05/03/2021  ? Flank pain 05/03/2021  ? Hyperglycemia 11/10/2020  ? Psoriasis 11/09/2020  ? Arthritis 07/05/2020  ? Bilateral occipital neuralgia 05/04/2020  ? Exertional headache 05/04/2020  ? Migraine without aura and without status migrainosus, not intractable 05/04/2020  ? History of COVID-19 02/24/2020  ? Bilateral carpal tunnel syndrome 10/19/2017  ? Colon cancer screening 12/16/2015  ? Hyperlipidemia, mixed 02/02/2015  ? Vitamin  D deficiency 02/02/2015  ? ADD (attention deficit disorder) 02/02/2015  ? Allergy   ? Endometriosis 07/07/2014  ? Acute pulmonary embolism  5 wks post hysterectomy 07/07/2014  ? ? ?Rennie Natter, PT,

## 2022-01-17 ENCOUNTER — Encounter: Payer: Self-pay | Admitting: Physical Therapy

## 2022-01-17 ENCOUNTER — Ambulatory Visit: Payer: 59 | Admitting: Physical Therapy

## 2022-01-17 DIAGNOSIS — M6281 Muscle weakness (generalized): Secondary | ICD-10-CM

## 2022-01-17 DIAGNOSIS — M542 Cervicalgia: Secondary | ICD-10-CM

## 2022-01-17 DIAGNOSIS — R252 Cramp and spasm: Secondary | ICD-10-CM

## 2022-01-17 DIAGNOSIS — R293 Abnormal posture: Secondary | ICD-10-CM

## 2022-01-17 NOTE — Therapy (Signed)
[place] [facility] ?[facility] ?[place], [place], [address] ?Phone: [phone]   Fax:  [phone] ? ?Physical Therapy Treatment ? ?Patient Details  ?Name: Whitney Trujillo [facility] ?MRN: [id number] ?Date of Birth: 08-11-1976 ?Referring Provider (PT): Whitney Day MD ? ? ?Encounter Date: 01/17/2022 ? ? PT End of Session - 01/17/22 0806   ? ? Visit Number 6   ? Number of Visits 12   ? Date for PT Re-Evaluation 02/09/22   ? Authorization Type UHC   ? Progress Note Due on Visit 10   ? PT Start Time 0802   ? PT Stop Time [id number]   ? PT Time Calculation (min) 44 min   ? Activity Tolerance Patient tolerated treatment well   ? Behavior During Therapy [facility] for tasks assessed/performed   ? ?  ?  ? ?  ? ? ?Past Medical History:  ?Diagnosis Date  ? Acute right lower quadrant pain 01/25/2016  ? ADD (attention deficit disorder) 02/02/2015  ? Allergy   ? seasonal allergies  ? Anxiety   ? Depression with anxiety 03/15/2016  ? Endometriosis   ? Gallstones   ? Hyperlipidemia, mixed 02/02/2015  ? Migraine 12/16/2015  ? Preventative health care 12/16/2015  ? Pulmonary emboli (HCC)   ? b/l PE after hysterectomy, 5 weeks post op, clotting problem ruled out at [facility]  ? ? ?Past Surgical History:  ?Procedure Laterality Date  ? ABDOMINAL HYSTERECTOMY    ? ovaries removed, in 2 separate procedures  ? CESAREAN SECTION  2007, 2009  ? CHOLECYSTECTOMY    ? CYSTOSCOPY  10/2014  ? EYE SURGERY    ? lasik b/l  ? LAPAROTOMY    ? Endometriosis  ? WISDOM TOOTH EXTRACTION    ? ? ?There were no vitals filed for this visit. ? ? Subjective Assessment - 01/17/22 0804   ? ? Subjective Pt. reports past few days has been very rough, feeling a lot of nerve pain.  Saturday while working on R side where popped kept cramping and "bulging up."  Having to take gabapentin which makes her tired.   ? Pertinent History MRI of the cervical spine demonstrates disc bulging osteophyte complex C5-6 paracentral to the left with  moderately severe left facet arthrosis. Severe facet arthrosis on the right C7 and T1 with moderately severe foraminal narrowing displacing the exiting right C8 nerve root. Moderately severe bilateral foraminal narrowing at C3-4. Since her previous MRI perhaps some mild progression in the foraminal stenosis at C5-6 on the left     1. Acute cervical strain hyperextension with radiculopathy C8 nerve root distribution. Foraminal stenosis on the right. No evidence of fracture or instability   2. Bilateral cubital tunnel syndrome   3. 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 Facial dysesthesias predating the injury   ? Diagnostic tests MRI   ? Patient Stated Goals release tension in upper back   ? Currently in Pain? Yes   ? Pain Score 2    working 7/10  ? Pain Location Neck   ? Pain Orientation Right   ? Pain Onset 1 to 4 weeks ago   ? ?  ?  ? ?  ? ? ? ? ? ? ? ? ? ? ? ? ? ? ? ? ? ? ? ? [place] Adult PT Treatment/Exercise - 01/17/22 0001   ? ?  ? Neck Exercises: Machines for Strengthening  ? UBE (Upper Arm Bike) L1  x 6 min (28f/3b)   ?  ? Neck Exercises: Standing  ? Other Standing Exercises standing open books x 10 bil - noted significant tightness on L side.   ? Other Standing Exercises wall angels x 10   ?  ? Neck Exercises: Seated  ? Other Seated Exercise self mobs with pillowcase into extension after DN   ?  ? Manual Therapy  ? Manual Therapy Soft tissue mobilization;Other (comment)   ? Manual therapy comments to decrease muscle spasm and pain and improve cervical ROM   ? Soft tissue mobilization STM/TPR to cervical paraspinals, UT and levators (bil)   ? Other Manual Therapy skilled palpation and monitoring with dry needling.   ? ?  ?  ? ?  ? ? ? Trigger Point Dry Needling - 01/17/22 0001   ? ? Consent Given? Yes   ? Education Handout Provided Previously provided   ? Muscles Treated Head and Neck Upper trapezius;Levator scapulae;Cervical multifidi   ? Dry Needling Comments bil; C6-C4   ? Upper Trapezius  Response Twitch reponse elicited;Palpable increased muscle length   ? Levator Scapulae Response Twitch response elicited;Palpable increased muscle length   ? Cervical multifidi Response Twitch reponse elicited;Palpable increased muscle length   ? ?  ?  ? ?  ? ? ? ? ? ? ? ? ? ? PT Short Term Goals - 01/06/22 0905   ? ?  ? PT SHORT TERM GOAL #1  ? Title Ind with initial HEP   ? Time 2   ? Period Weeks   ? Status Achieved   ? Target Date 01/12/22   ? ?  ?  ? ?  ? ? ? ? PT Long Term Goals - 01/02/22 1620   ? ?  ? PT LONG TERM GOAL #1  ? Title Ind with progressed HEP to improve posture and outcomes.   ? Time 6   ? Period Weeks   ? Status On-going   ? Target Date 02/09/22   ?  ? PT LONG TERM GOAL #2  ? Title Pt. will demonstrate improved cervical AROM with 60 deg without pain all directions for safety with driving.   ? Baseline see flowsheet, very slow gaurded painful movements.   ? Time 6   ? Period Weeks   ? Status On-going   ? Target Date 02/09/22   ?  ? PT LONG TERM GOAL #3  ? Title Pt. will demonstrate improved functional ability as demonstrated by 60% on FOTO.   ? Baseline 34% on FOTO = 66% disability   ? Time 6   ? Period Weeks   ? Status On-going   ? Target Date 02/09/22   ?  ? PT LONG TERM GOAL #4  ? Title Pt. will report 75% improvement in neck/upper back pain to improve QOL   ? Time 6   ? Period Weeks   ? Status On-going   ? Target Date 02/09/22   ? ?  ?  ? ?  ? ? ? ? ? ? ? ? Plan - 01/17/22 0849   ? ? Clinical Impression Statement [name] continues to demonstrate significant tightness in bil UT/LS and cervical paraspinals, despite good compliance with HEP and stretching throughout the [place].  Focused today on manual therapy, noted multiple trigger points throuhghout these muscles.  Noted decreasing sympathetic response to dry needling so able to address more muscles with this technique today.  She reported decreased tightness following interventions.   ? Personal Factors  and Comorbidities Comorbidity 3+;Profession    ? Comorbidities arthritis, psoriasis, chronic neck pain, carpal tunnel syndrome   ? Examination-Activity Limitations Bathing;Carry;Lift;Reach Overhead;Dressing;Caring for Others   ? Examination-Participation Restrictions Driving;Cleaning;Meal Prep;Community Activity;Laundry;Occupation;Shop   ? Stability/Clinical Decision Making Evolving/Moderate complexity   ? Rehab Potential Good   ? PT Frequency 2x / week   ? PT Duration 6 weeks   ? PT Treatment/Interventions ADLs/Self Care Home Management;Cryotherapy;Electrical Stimulation;Moist Heat;Traction;Ultrasound;Functional mobility training;Therapeutic activities;Therapeutic exercise;Balance training;Neuromuscular re-education;Patient/family education;Manual techniques;Passive range of motion;Dry needling;Spinal Manipulations;Joint Manipulations;Taping   ? PT Next Visit Plan HEP for postural strengthening, modalities/manual therapy PRN   ? Consulted and Agree with Plan of Care Patient   ? ?  ?  ? ?  ? ? ?Patient will benefit from skilled therapeutic intervention in order to improve the following deficits and impairments:  Decreased activity tolerance, Decreased range of motion, Decreased strength, Dizziness, Hypomobility, Increased fascial restricitons, Impaired UE functional use, Pain, Postural dysfunction, Impaired flexibility, Increased muscle spasms, Decreased mobility ? ?Visit Diagnosis: ?Cervicalgia ? ?Muscle weakness (generalized) ? ?Abnormal posture ? ?Cramp and spasm ? ? ? ? ?Problem List ?Patient Active Problem List  ? Diagnosis Date Noted  ? Gross hematuria 05/03/2021  ? Flank pain 05/03/2021  ? Hyperglycemia 11/10/2020  ? Psoriasis 11/09/2020  ? Arthritis 07/05/2020  ? Bilateral occipital neuralgia 05/04/2020  ? Exertional headache 05/04/2020  ? Migraine without aura and without status migrainosus, not intractable 05/04/2020  ? History of COVID-19 02/24/2020  ? Bilateral carpal tunnel syndrome 10/19/2017  ? Colon cancer screening 12/16/2015  ?  Hyperlipidemia, mixed 02/02/2015  ? Vitamin D deficiency 02/02/2015  ? ADD (attention deficit disorder) 02/02/2015  ? Allergy   ? Endometriosis 07/07/2014  ? Acute pulmonary embolism  5 wks post hysterectomy 10/13/201

## 2022-01-19 ENCOUNTER — Encounter: Payer: Self-pay | Admitting: Physical Therapy

## 2022-01-19 ENCOUNTER — Ambulatory Visit: Payer: 59 | Admitting: Physical Therapy

## 2022-01-19 DIAGNOSIS — M542 Cervicalgia: Secondary | ICD-10-CM | POA: Diagnosis not present

## 2022-01-19 DIAGNOSIS — M6281 Muscle weakness (generalized): Secondary | ICD-10-CM

## 2022-01-19 DIAGNOSIS — R252 Cramp and spasm: Secondary | ICD-10-CM

## 2022-01-19 DIAGNOSIS — R293 Abnormal posture: Secondary | ICD-10-CM

## 2022-01-19 NOTE — Patient Instructions (Signed)
Access Code: [email or web address] ?URL: [email or web address] ?Date: 01/19/2022 ?Prepared by: Harrie Foreman ? ?Exercises  Added  ?-- Prone W Scapular Retraction  - 1 x daily - 7 x weekly - 3 sets - 10 reps ?- Cervical Extension Prone on Elbows  - 1 x daily - 7 x weekly - 3 sets - 10 reps ?- Prone Cervical Retraction  - 1 x daily - 7 x weekly - 3 sets - 10 reps ?

## 2022-01-19 NOTE — Therapy (Signed)
[place] ?[facility] ?[facility] ?[place], [place], [address] ?Phone: [phone]   Fax:  [phone] ? ?Physical Therapy Treatment ? ?Patient Details  ?Name: Whitney Trujillo [facility] ?MRN: [id number] ?Date of Birth: June 02, 1976 ?Referring Provider (PT): Susa Day MD ? ? ?Encounter Date: 01/19/2022 ? ? PT End of Session - 01/19/22 0934   ? ? Visit Number 7   ? Number of Visits 12   ? Date for PT Re-Evaluation 02/09/22   ? Authorization Type UHC   ? Progress Note Due on Visit 10   ? PT Start Time [phone]   ? PT Stop Time [id number]   ? PT Time Calculation (min) 60 min   ? Activity Tolerance Patient tolerated treatment well   ? Behavior During Therapy [facility] for tasks assessed/performed   ? ?  ?  ? ?  ? ? ?Past Medical History:  ?Diagnosis Date  ? Acute right lower quadrant pain 01/25/2016  ? ADD (attention deficit disorder) 02/02/2015  ? Allergy   ? seasonal allergies  ? Anxiety   ? Depression with anxiety 03/15/2016  ? Endometriosis   ? Gallstones   ? Hyperlipidemia, mixed 02/02/2015  ? Migraine 12/16/2015  ? Preventative health care 12/16/2015  ? Pulmonary emboli (HCC)   ? b/l PE after hysterectomy, 5 weeks post op, clotting problem ruled out at [facility]  ? ? ?Past Surgical History:  ?Procedure Laterality Date  ? ABDOMINAL HYSTERECTOMY    ? ovaries removed, in 2 separate procedures  ? CESAREAN SECTION  2007, 2009  ? CHOLECYSTECTOMY    ? CYSTOSCOPY  10/2014  ? EYE SURGERY    ? lasik b/l  ? LAPAROTOMY    ? Endometriosis  ? WISDOM TOOTH EXTRACTION    ? ? ?There were no vitals filed for this visit. ? ? Subjective Assessment - 01/19/22 0937   ? ? Subjective Pt. reports she is still tight.   ? Pertinent History MRI of the cervical spine demonstrates disc bulging osteophyte complex C5-6 paracentral to the left with moderately severe left facet arthrosis. Severe facet arthrosis on the right C7 and T1 with moderately severe foraminal narrowing displacing the exiting right C8 nerve root.  Moderately severe bilateral foraminal narrowing at C3-4. Since her previous MRI perhaps some mild progression in the foraminal stenosis at C5-6 on the left     1. Acute cervical strain hyperextension with radiculopathy C8 nerve root distribution. Foraminal stenosis on the right. No evidence of fracture or instability   2. Bilateral cubital tunnel syndrome   3. 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 Facial dysesthesias predating the injury   ? Diagnostic tests MRI   ? Patient Stated Goals release tension in upper back   ? Currently in Pain? Yes   ? Pain Score 3    ? Pain Location Neck   ? Pain Onset 1 to 4 weeks ago   ? ?  ?  ? ?  ? ? ? ? ? ? ? ? ? ? ? ? ? ? ? ? ? ? ? ? [place] Adult PT Treatment/Exercise - 01/19/22 0001   ? ?  ? Neck Exercises: Machines for Strengthening  ? UBE (Upper Arm Bike) L1 x 6 min (43f/3b)   ?  ? Neck Exercises: Standing  ? Other Standing Exercises standing protraction/retraction scapula with ball on wall x 10 bil   ?  ? Neck Exercises: Prone  ? Neck Retraction 10 reps   ?  W Back 10 reps   ? Rows 15 reps   ? Rows Limitations increased neck pain when added 2# weights   ? Other Prone Exercise prone press-ups with neck extension x 10   ? Other Prone Exercise POE with chin tuck x 10   ?  ? Manual Therapy  ? Manual Therapy Joint mobilization;Soft tissue mobilization;Myofascial release;Other (comment)   ? Manual therapy comments to decrease muscle spasm and pain and improve cervical ROM   ? Joint Mobilization PA mobs cervical spine and upper thoracic grade 3-4, cervical NAGs into rotation   ? Soft tissue mobilization STM/TPR to cervical paraspinals, UT and levators (bil)   ? Myofascial Release MFR to L UT   ? Other Manual Therapy skilled palpation and monitoring with dry needling.   ? ?  ?  ? ?  ? ? ? Trigger Point Dry Needling - 01/19/22 0001   ? ? Consent Given? Yes   ? Education Handout Provided Previously provided   ? Muscles Treated Head and Neck Upper trapezius;Levator  scapulae;Cervical multifidi   ? Dry Needling Comments bil; C6-C4   ? Upper Trapezius Response Twitch reponse elicited;Palpable increased muscle length   ? Levator Scapulae Response Twitch response elicited;Palpable increased muscle length   ? Cervical multifidi Response Twitch reponse elicited;Palpable increased muscle length   ? ?  ?  ? ?  ? ? ? ? ? ? ? ? PT Education - 01/19/22 1045   ? ? Education Details HEP update   ? Person(s) Educated Patient   ? Methods Explanation;Demonstration;Verbal cues   ? Comprehension Verbalized understanding;Returned demonstration   ? ?  ?  ? ?  ? ? ? PT Short Term Goals - 01/06/22 0905   ? ?  ? PT SHORT TERM GOAL #1  ? Title Ind with initial HEP   ? Time 2   ? Period Weeks   ? Status Achieved   ? Target Date 01/12/22   ? ?  ?  ? ?  ? ? ? ? PT Long Term Goals - 01/02/22 1620   ? ?  ? PT LONG TERM GOAL #1  ? Title Ind with progressed HEP to improve posture and outcomes.   ? Time 6   ? Period Weeks   ? Status On-going   ? Target Date 02/09/22   ?  ? PT LONG TERM GOAL #2  ? Title Pt. will demonstrate improved cervical AROM with 60 deg without pain all directions for safety with driving.   ? Baseline see flowsheet, very slow gaurded painful movements.   ? Time 6   ? Period Weeks   ? Status On-going   ? Target Date 02/09/22   ?  ? PT LONG TERM GOAL #3  ? Title Pt. will demonstrate improved functional ability as demonstrated by 60% on FOTO.   ? Baseline 34% on FOTO = 66% disability   ? Time 6   ? Period Weeks   ? Status On-going   ? Target Date 02/09/22   ?  ? PT LONG TERM GOAL #4  ? Title Pt. will report 75% improvement in neck/upper back pain to improve QOL   ? Time 6   ? Period Weeks   ? Status On-going   ? Target Date 02/09/22   ? ?  ?  ? ?  ? ? ? ? ? ? ? ? Plan - 01/19/22 1039   ? ? Clinical Impression Statement Progressed exercises to include prone exercises for  posterior shoulder strengthening and scapular mobility.  Continues to demonstrate significant tightness and trigger points  throughout bil UT/LS and cervical paraspinals.  Decreased tightness following interventions, would benefit from continued skilled therapy.   ? Personal Factors and Comorbidities Comorbidity 3+;Profession   ? Comorbidities arthritis, psoriasis, chronic neck pain, carpal tunnel syndrome   ? Examination-Activity Limitations Bathing;Carry;Lift;Reach Overhead;Dressing;Caring for Others   ? Examination-Participation Restrictions Driving;Cleaning;Meal Prep;Community Activity;Laundry;Occupation;Shop   ? Stability/Clinical Decision Making Evolving/Moderate complexity   ? Rehab Potential Good   ? PT Frequency 2x / week   ? PT Duration 6 weeks   ? PT Treatment/Interventions ADLs/Self Care Home Management;Cryotherapy;Electrical Stimulation;Moist Heat;Traction;Ultrasound;Functional mobility training;Therapeutic activities;Therapeutic exercise;Balance training;Neuromuscular re-education;Patient/family education;Manual techniques;Passive range of motion;Dry needling;Spinal Manipulations;Joint Manipulations;Taping   ? PT Next Visit Plan HEP for postural strengthening, modalities/manual therapy PRN   ? Consulted and Agree with Plan of Care Patient   ? ?  ?  ? ?  ? ? ?Patient will benefit from skilled therapeutic intervention in order to improve the following deficits and impairments:  Decreased activity tolerance, Decreased range of motion, Decreased strength, Dizziness, Hypomobility, Increased fascial restricitons, Impaired UE functional use, Pain, Postural dysfunction, Impaired flexibility, Increased muscle spasms, Decreased mobility ? ?Visit Diagnosis: ?Cervicalgia ? ?Muscle weakness (generalized) ? ?Abnormal posture ? ?Cramp and spasm ? ? ? ? ?Problem List ?Patient Active Problem List  ? Diagnosis Date Noted  ? Gross hematuria 05/03/2021  ? Flank pain 05/03/2021  ? Hyperglycemia 11/10/2020  ? Psoriasis 11/09/2020  ? Arthritis 07/05/2020  ? Bilateral occipital neuralgia 05/04/2020  ? Exertional headache 05/04/2020  ? Migraine  without aura and without status migrainosus, not intractable 05/04/2020  ? History of COVID-19 02/24/2020  ? Bilateral carpal tunnel syndrome 10/19/2017  ? Colon cancer screening 12/16/2015  ? Hyperlipidemia, mixe

## 2022-01-24 ENCOUNTER — Encounter: Payer: Self-pay | Admitting: Physical Therapy

## 2022-01-24 ENCOUNTER — Ambulatory Visit: Payer: 59 | Attending: Specialist | Admitting: Physical Therapy

## 2022-01-24 DIAGNOSIS — M6281 Muscle weakness (generalized): Secondary | ICD-10-CM | POA: Insufficient documentation

## 2022-01-24 DIAGNOSIS — M542 Cervicalgia: Secondary | ICD-10-CM | POA: Insufficient documentation

## 2022-01-24 DIAGNOSIS — R252 Cramp and spasm: Secondary | ICD-10-CM | POA: Diagnosis present

## 2022-01-24 DIAGNOSIS — R293 Abnormal posture: Secondary | ICD-10-CM | POA: Insufficient documentation

## 2022-01-24 NOTE — Therapy (Signed)
[place] [facility] ?[address]  [address] ?[place], [place], [address] ?Phone: [phone]   Fax:  [phone] ? ?Physical Therapy Treatment ? ?Patient Details  ?Name: Whitney Trujillo ?MRN: [id number] ?Date of Birth: 1975/10/10 ?Referring Provider (PT): Jene Every MD ? ? ?Encounter Date: 01/24/2022 ? ? PT End of Session - 01/24/22 1020   ? ? Visit Number 8   ? Number of Visits 12   ? Date for PT Re-Evaluation 02/09/22   ? Authorization Type UHC   ? Progress Note Due on Visit 10   ? PT Start Time 1016   ? PT Stop Time 1115   ? PT Time Calculation (min) 59 min   ? Activity Tolerance Patient tolerated treatment well   ? Behavior During Therapy [facility] for tasks assessed/performed   ? ?  ?  ? ?  ? ? ?Past Medical History:  ?Diagnosis Date  ? Acute right lower quadrant pain 01/25/2016  ? ADD (attention deficit disorder) 02/02/2015  ? Allergy   ? seasonal allergies  ? Anxiety   ? Depression with anxiety 03/15/2016  ? Endometriosis   ? Gallstones   ? Hyperlipidemia, mixed 02/02/2015  ? Migraine 12/16/2015  ? Preventative health care 12/16/2015  ? Pulmonary emboli (HCC)   ? b/l PE after hysterectomy, 5 weeks post op, clotting problem ruled out at [facility]  ? ? ?Past Surgical History:  ?Procedure Laterality Date  ? ABDOMINAL HYSTERECTOMY    ? ovaries removed, in 2 separate procedures  ? CESAREAN SECTION  2007, 2009  ? CHOLECYSTECTOMY    ? CYSTOSCOPY  10/2014  ? EYE SURGERY    ? lasik b/l  ? LAPAROTOMY    ? Endometriosis  ? WISDOM TOOTH EXTRACTION    ? ? ?There were no vitals filed for this visit. ? ? Subjective Assessment - 01/24/22 1019   ? ? Subjective Neck has been hurting and R UT has been a mess.  Had to take a muscle relaxer and a gabapentin last night and hasn't had to do that in a while.   ? Pertinent History MRI of the cervical spine demonstrates disc bulging osteophyte complex C5-6 paracentral to the left with moderately severe left facet arthrosis. Severe facet arthrosis on  the right C7 and T1 with moderately severe foraminal narrowing displacing the exiting right C8 nerve root. Moderately severe bilateral foraminal narrowing at C3-4. Since her previous MRI perhaps some mild progression in the foraminal stenosis at C5-6 on the left     1. Acute cervical strain hyperextension with radiculopathy C8 nerve root distribution. Foraminal stenosis on the right. No evidence of fracture or instability   2. Bilateral cubital tunnel syndrome   3. 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 Facial dysesthesias predating the injury   ? Diagnostic tests MRI   ? Patient Stated Goals release tension in upper back   ? Currently in Pain? Yes   ? Pain Score 5    ? Pain Location Neck   ? Pain Orientation Right   ? Pain Onset 1 to 4 weeks ago   ? ?  ?  ? ?  ? ? ? ? ? ? ? ? ? ? ? ? ? ? ? ? ? ? ? ? [facility] Adult PT Treatment/Exercise - 01/24/22 0001   ? ?  ? Self-Care  ? Self-Care Other Self-Care Comments   ? Other Self-Care Comments  education on traction units, recommendations on inexpensive  options, also education on use of TENS unit, trialing on R UT today.   ?  ? Neck Exercises: Machines for Strengthening  ? UBE (Upper Arm Bike) L1 x 6 min (21f/3b)   ?  ? Neck Exercises: Supine  ? Other Supine Exercise lengthwise on foam roller for thoracic mobs - arm flexion x 10 bil, angels x 10 bil, Y-arms x 10 bil, pectoralis stretch 60 and 90 deg x 1 min each   ?  ? Modalities  ? Modalities Electrical Stimulation;Traction   ?  ? Electrical Stimulation  ? Electrical Stimulation Location R UT   ? Electrical Stimulation Action TENS   ? Electrical Stimulation Parameters 130us, 150Hz , normal mode   ? Electrical Stimulation Goals Pain   ?  ? Traction  ? Type of Traction Cervical   ? Max (lbs) 20   ? Hold Time , 5 min   ? Rest Time 1 min   ? Time 10   ?  ? Manual Therapy  ? Manual Therapy Joint mobilization;Soft tissue mobilization   ? Manual therapy comments to decrease muscle spasm and pain and improve cervical  ROM   ? Joint Mobilization PA mobs cervical spine and upper thoracic grade 3-4, cervical NAGs into rotation, L scapular mobs   ? Soft tissue mobilization STM/TPR to cervical paraspinals, L subscap release   ? ?  ?  ? ?  ? ? ? ? ? ? ? ? ? ? PT Education - 01/24/22 1126   ? ? Education Details HEP update Access Code: 03/26/22 and patient education on TENS and traction.   ? Person(s) Educated Patient   ? Methods Explanation;Demonstration;Verbal cues;Handout   ? Comprehension Verbalized understanding;Returned demonstration   ? ?  ?  ? ?  ? ? ? PT Short Term Goals - 01/06/22 0905   ? ?  ? PT SHORT TERM GOAL #1  ? Title Ind with initial HEP   ? Time 2   ? Period Weeks   ? Status Achieved   ? Target Date 01/12/22   ? ?  ?  ? ?  ? ? ? ? PT Long Term Goals - 01/02/22 1620   ? ?  ? PT LONG TERM GOAL #1  ? Title Ind with progressed HEP to improve posture and outcomes.   ? Time 6   ? Period Weeks   ? Status On-going   ? Target Date 02/09/22   ?  ? PT LONG TERM GOAL #2  ? Title Pt. will demonstrate improved cervical AROM with 60 deg without pain all directions for safety with driving.   ? Baseline see flowsheet, very slow gaurded painful movements.   ? Time 6   ? Period Weeks   ? Status On-going   ? Target Date 02/09/22   ?  ? PT LONG TERM GOAL #3  ? Title Pt. will demonstrate improved functional ability as demonstrated by 60% on FOTO.   ? Baseline 34% on FOTO = 66% disability   ? Time 6   ? Period Weeks   ? Status On-going   ? Target Date 02/09/22   ?  ? PT LONG TERM GOAL #4  ? Title Pt. will report 75% improvement in neck/upper back pain to improve QOL   ? Time 6   ? Period Weeks   ? Status On-going   ? Target Date 02/09/22   ? ?  ?  ? ?  ? ? ? ? ? ? ? ? Plan -  01/24/22 1127   ? ? Clinical Impression Statement Patient continues to report significant tightness and pain despite good compliance with HEP and interventions.  She has found chest stretches very helpful, so progressed exercises on foam roller for thoracic self mobs.   We also trialed traction today which she reported decreased muscle tightness, although did report some tingling in face afterwards.  This may be due to pressure on the suboccipitals.  She was given a TENS unit by client, discussed that this may be a good option for her especially when doing long procedure at work, demonstrated set up and care with our TENS today, she will bring in her TENS unit next session.  Ended session with manual therapy, but deferred further dry needling as she reports she is not getting as much benefit and it is quite uncomfortable for her.  She would benefit from continued skilled therapy.   ? Personal Factors and Comorbidities Comorbidity 3+;Profession   ? Comorbidities arthritis, psoriasis, chronic neck pain, carpal tunnel syndrome   ? Examination-Activity Limitations Bathing;Carry;Lift;Reach Overhead;Dressing;Caring for Others   ? Examination-Participation Restrictions Driving;Cleaning;Meal Prep;Community Activity;Laundry;Occupation;Shop   ? Stability/Clinical Decision Making Evolving/Moderate complexity   ? Rehab Potential Good   ? PT Frequency 2x / week   ? PT Duration 6 weeks   ? PT Treatment/Interventions ADLs/Self Care Home Management;Cryotherapy;Electrical Stimulation;Moist Heat;Traction;Ultrasound;Functional mobility training;Therapeutic activities;Therapeutic exercise;Balance training;Neuromuscular re-education;Patient/family education;Manual techniques;Passive range of motion;Dry needling;Spinal Manipulations;Joint Manipulations;Taping   ? PT Next Visit Plan HEP for postural strengthening, modalities/manual therapy PRN   ? Consulted and Agree with Plan of Care Patient   ? ?  ?  ? ?  ? ? ?Patient will benefit from skilled therapeutic intervention in order to improve the following deficits and impairments:  Decreased activity tolerance, Decreased range of motion, Decreased strength, Dizziness, Hypomobility, Increased fascial restricitons, Impaired UE functional use, Pain, Postural  dysfunction, Impaired flexibility, Increased muscle spasms, Decreased mobility ? ?Visit Diagnosis: ?Cervicalgia ? ?Muscle weakness (generalized) ? ?Abnormal posture ? ?Cramp and spasm ? ? ? ? ?Probl

## 2022-01-24 NOTE — Patient Instructions (Signed)
Access Code: [email or web address] ?URL: [email or web address] ?Date: 01/24/2022 ?Prepared by: Harrie Foreman ? ?Exercises ?- Seated Scapular Retraction  - 1 x daily - 7 x weekly - 2 sets - 10 reps ?- Seated Cervical Retraction  - 1 x daily - 7 x weekly - 2 sets - 10 reps ?- Mid-Lower Cervical Extension SNAG with Strap  - 1 x daily - 7 x weekly - 2 sets - 10 reps ?- Seated Assisted Cervical Rotation with Towel  - 1 x daily - 7 x weekly - 2 sets - 10 reps ?- Gentle Levator Scapulae Stretch  - 1 x daily - 7 x weekly - 1 sets - 3 reps - 15 sec hold ?- Upper Trapezius Stretch  - 1 x daily - 7 x weekly - 1 sets - 3 reps - 15 sec  hold ?- Sidelying Open Book Thoracic Lumbar Rotation and Extension  - 1 x daily - 7 x weekly - 2 sets - 10 reps ?- Supine Chest Stretch on Foam Roll  - 1 x daily - 7 x weekly - 1 sets ?- Thoracic Extension Mobilization on Foam Roll  - 1 x daily - 7 x weekly - 10 reps ?- Thoracic Mobilization on Foam Roll  - 1 x daily - 7 x weekly - 10 reps ?- Shoulder extension with resistance - Neutral  - 1 x daily - 7 x weekly - 2-3 sets - 10 reps ?- Scapular Retraction with Resistance  - 1 x daily - 7 x weekly - 2-3 sets - 10 reps ?- Standing Shoulder External Rotation with Resistance  - 1 x daily - 7 x weekly - 2-3 sets - 10 reps ?- Corner Pec Major Stretch  - 1 x daily - 7 x weekly - 3 reps - 15-30sec hold ?- Prone W Scapular Retraction  - 1 x daily - 7 x weekly - 3 sets - 10 reps ?- Cervical Extension Prone on Elbows  - 1 x daily - 7 x weekly - 3 sets - 10 reps ?- Prone Cervical Retraction  - 1 x daily - 7 x weekly - 3 sets - 10 reps ?- Snow Angels on Foam Roll  - 1 x daily - 7 x weekly - 3 sets - 10 reps ?- Thoracic Foam Roll Mobilization Backstroke  - 1 x daily - 7 x weekly - 3 sets - 10 reps ?- Open Book Chest Rotation Stretch on Foam 1/2 Roll  - 1 x daily - 7 x weekly - 3 sets - 10 reps ? ?Patient Education ?- TENS Therapy ?- TENS Unit ?

## 2022-01-27 ENCOUNTER — Ambulatory Visit: Payer: 59 | Admitting: Physical Therapy

## 2022-01-27 ENCOUNTER — Encounter: Payer: Self-pay | Admitting: Physical Therapy

## 2022-01-27 DIAGNOSIS — R252 Cramp and spasm: Secondary | ICD-10-CM

## 2022-01-27 DIAGNOSIS — M542 Cervicalgia: Secondary | ICD-10-CM

## 2022-01-27 DIAGNOSIS — M6281 Muscle weakness (generalized): Secondary | ICD-10-CM

## 2022-01-27 DIAGNOSIS — R293 Abnormal posture: Secondary | ICD-10-CM

## 2022-01-27 NOTE — Therapy (Signed)
[place] ?[facility] ?[address] ?[place], [place], [address] ?Phone: [phone]   Fax:  [phone] ? ?Physical Therapy Treatment ? ?Patient Details  ?Name: Whitney Trujillo ?MRN: [id number] ?Date of Birth: [birth year] ?Referring Provider (PT): Susa Day MD ? ? ?Encounter Date: 01/27/2022 ? ? PT End of Session - 01/27/22 0931   ? ? Visit Number 9   ? Number of Visits 12   ? Date for PT Re-Evaluation 02/09/22   ? Authorization Type UHC   ? Progress Note Due on Visit 10   ? PT Start Time [phone]   ? PT Stop Time 1015   ? PT Time Calculation (min) 48 min   ? Activity Tolerance Patient tolerated treatment well   ? Behavior During Therapy [facility] for tasks assessed/performed   ? ?  ?  ? ?  ? ? ?Past Medical History:  ?Diagnosis Date  ? Acute right lower quadrant pain 01/25/2016  ? ADD (attention deficit disorder) 02/02/2015  ? Allergy   ? seasonal allergies  ? Anxiety   ? Depression with anxiety 03/15/2016  ? Endometriosis   ? Gallstones   ? Hyperlipidemia, mixed 02/02/2015  ? Migraine 12/16/2015  ? Preventative health care 12/16/2015  ? Pulmonary emboli (HCC)   ? b/l PE after hysterectomy, 5 weeks post op, clotting problem ruled out at [facility]  ? ? ?Past Surgical History:  ?Procedure Laterality Date  ? ABDOMINAL HYSTERECTOMY    ? ovaries removed, in 2 separate procedures  ? CESAREAN SECTION  2007, 2009  ? CHOLECYSTECTOMY    ? CYSTOSCOPY  10/2014  ? EYE SURGERY    ? lasik b/l  ? LAPAROTOMY    ? Endometriosis  ? WISDOM TOOTH EXTRACTION    ? ? ?There were no vitals filed for this visit. ? ? Subjective Assessment - 01/27/22 0928   ? ? Subjective Pt. reports still hurting, but didn't have to take gabapentin until last night.  She did get a cervical traction device and using that and TENS which helps a little.   ? Pertinent History MRI of the cervical spine demonstrates disc bulging osteophyte complex C5-6 paracentral to the left with moderately severe left facet arthrosis.  Severe facet arthrosis on the right C7 and T1 with moderately severe foraminal narrowing displacing the exiting right C8 nerve root. Moderately severe bilateral foraminal narrowing at C3-4. Since her previous MRI perhaps some mild progression in the foraminal stenosis at C5-6 on the left     1. Acute cervical strain hyperextension with radiculopathy C8 nerve root distribution. Foraminal stenosis on the right. No evidence of fracture or instability   2. Bilateral cubital tunnel syndrome   3. 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 Facial dysesthesias predating the injury   ? Diagnostic tests MRI   ? Patient Stated Goals release tension in upper back   ? Currently in Pain? Yes   ? Pain Score 3    ? Pain Location Neck   ? Pain Orientation Right   ? Pain Onset 1 to 4 weeks ago   ? ?  ?  ? ?  ? ? ? ? ? ? ? ? ? ? ? ? ? ? ? ? ? ? ? ? [facility] Adult PT Treatment/Exercise - 01/27/22 0001   ? ?  ? Neck Exercises: Machines for Strengthening  ? UBE (Upper Arm Bike) L1 x 6 min (86f/3b)   ?  ? Neck Exercises:  Seated  ? Cervical Isometrics Flexion;Right lateral flexion;Left lateral flexion;5 secs;5 reps   ? Cervical Isometrics Limitations submax, difficulty with arms overhead   ?  ? Hand Exercises for Cervical Radiculopathy  ? Other Hand Exercise for Cervical Radiculopathy ulnar nerve flosses   ?  ? Manual Therapy  ? Manual Therapy Joint mobilization;Soft tissue mobilization;Myofascial release;Other (comment)   ? Manual therapy comments to decrease muscle spasm and pain and improve cervical ROM   ? Joint Mobilization PA mobs cervical spine and upper thoracic grade 3-4, cervical NAGs into rotation, upper thoracic mobs   ? Soft tissue mobilization STM to cervical paraspinals   ? Myofascial Release MRF to bil pecs, TPR ant scalenes and R UT   ? Other Manual Therapy skilled palpation and monitoring with dry needling.   ? ?  ?  ? ?  ? ? ? Trigger Point Dry Needling - 01/27/22 0001   ? ? Consent Given? Yes   ? Education  Handout Provided Previously provided   ? Muscles Treated Head and Neck Upper trapezius   ? Dry Needling Comments right   ? Upper Trapezius Response Twitch reponse elicited;Palpable increased muscle length   ? ?  ?  ? ?  ? ? ? ? ? ? ? ? ? ? PT Short Term Goals - 01/06/22 0905   ? ?  ? PT SHORT TERM GOAL #1  ? Title Ind with initial HEP   ? Time 2   ? Period Weeks   ? Status Achieved   ? Target Date 01/12/22   ? ?  ?  ? ?  ? ? ? ? PT Long Term Goals - 01/02/22 1620   ? ?  ? PT LONG TERM GOAL #1  ? Title Ind with progressed HEP to improve posture and outcomes.   ? Time 6   ? Period Weeks   ? Status On-going   ? Target Date 02/09/22   ?  ? PT LONG TERM GOAL #2  ? Title Pt. will demonstrate improved cervical AROM with 60 deg without pain all directions for safety with driving.   ? Baseline see flowsheet, very slow gaurded painful movements.   ? Time 6   ? Period Weeks   ? Status On-going   ? Target Date 02/09/22   ?  ? PT LONG TERM GOAL #3  ? Title Pt. will demonstrate improved functional ability as demonstrated by 60% on FOTO.   ? Baseline 34% on FOTO = 66% disability   ? Time 6   ? Period Weeks   ? Status On-going   ? Target Date 02/09/22   ?  ? PT LONG TERM GOAL #4  ? Title Pt. will report 75% improvement in neck/upper back pain to improve QOL   ? Time 6   ? Period Weeks   ? Status On-going   ? Target Date 02/09/22   ? ?  ?  ? ?  ? ? ? ? ? ? ? ? Plan - 01/27/22 1214   ? ? Clinical Impression Statement Pt. continues to report tightness and neck pain, as well as tinging in L side of face.  Today introduced cervical isometrics but difficult to maintain arms in overhead position.  Also trialed several ulnar nerve glides/flosses, tolerated one that did not require reaching up so given for HEP.  Noted today improve cervical mobility with manual therapy, but hypomobile in upper thoracic spine.  She would benefit from continued skilled therapy.   ?  Personal Factors and Comorbidities Comorbidity 3+;Profession   ? Comorbidities  arthritis, psoriasis, chronic neck pain, carpal tunnel syndrome   ? Examination-Activity Limitations Bathing;Carry;Lift;Reach Overhead;Dressing;Caring for Others   ? Examination-Participation Restrictions Driving;Cleaning;Meal Prep;Community Activity;Laundry;Occupation;Shop   ? Stability/Clinical Decision Making Evolving/Moderate complexity   ? Rehab Potential Good   ? PT Frequency 2x / week   ? PT Duration 6 weeks   ? PT Treatment/Interventions ADLs/Self Care Home Management;Cryotherapy;Electrical Stimulation;Moist Heat;Traction;Ultrasound;Functional mobility training;Therapeutic activities;Therapeutic exercise;Balance training;Neuromuscular re-education;Patient/family education;Manual techniques;Passive range of motion;Dry needling;Spinal Manipulations;Joint Manipulations;Taping   ? PT Next Visit Plan HEP for postural strengthening, modalities/manual therapy PRN   ? Consulted and Agree with Plan of Care Patient   ? ?  ?  ? ?  ? ? ?Patient will benefit from skilled therapeutic intervention in order to improve the following deficits and impairments:  Decreased activity tolerance, Decreased range of motion, Decreased strength, Dizziness, Hypomobility, Increased fascial restricitons, Impaired UE functional use, Pain, Postural dysfunction, Impaired flexibility, Increased muscle spasms, Decreased mobility ? ?Visit Diagnosis: ?Cervicalgia ? ?Muscle weakness (generalized) ? ?Abnormal posture ? ?Cramp and spasm ? ? ? ? ?Problem List ?Patient Active Problem List  ? Diagnosis Date Noted  ? Gross hematuria 05/03/2021  ? Flank pain 05/03/2021  ? Hyperglycemia 11/10/2020  ? Psoriasis 11/09/2020  ? Arthritis 07/05/2020  ? Bilateral occipital neuralgia 05/04/2020  ? Exertional headache 05/04/2020  ? Migraine without aura and without status migrainosus, not intractable 05/04/2020  ? History of COVID-19 02/24/2020  ? Bilateral carpal tunnel syndrome 10/19/2017  ? Colon cancer screening 12/16/2015  ? Hyperlipidemia, mixed 02/02/2015   ? Vitamin D deficiency 02/02/2015  ? ADD (attention deficit disorder) 02/02/2015  ? Allergy   ? Endometriosis 07/07/2014  ? Acute pulmonary embolism  5 wks post hysterectomy 07/07/2014  ? ? ?Benjiman Core

## 2022-01-30 NOTE — Therapy (Incomplete)
?OUTPATIENT PHYSICAL THERAPY CERVICAL EVALUATION ? ? ?Patient Name: Whitney Trujillo ?MRN: [id number] ?DOB:11/13/1975, 46 y.o., female ?Today's Date: 01/30/2022 ? ? ? ?Past Medical History:  ?Diagnosis Date  ? Acute right lower quadrant pain 01/25/2016  ? ADD (attention deficit disorder) 02/02/2015  ? Allergy   ? seasonal allergies  ? Anxiety   ? Depression with anxiety 03/15/2016  ? Endometriosis   ? Gallstones   ? Hyperlipidemia, mixed 02/02/2015  ? Migraine 12/16/2015  ? Preventative health care 12/16/2015  ? Pulmonary emboli (HCC)   ? b/l PE after hysterectomy, 5 weeks post op, clotting problem ruled out at [facility]  ? ?Past Surgical History:  ?Procedure Laterality Date  ? ABDOMINAL HYSTERECTOMY    ? ovaries removed, in 2 separate procedures  ? CESAREAN SECTION  2007, 2009  ? CHOLECYSTECTOMY    ? CYSTOSCOPY  10/2014  ? EYE SURGERY    ? lasik b/l  ? LAPAROTOMY    ? Endometriosis  ? WISDOM TOOTH EXTRACTION    ? ?Patient Active Problem List  ? Diagnosis Date Noted  ? Gross hematuria 05/03/2021  ? Flank pain 05/03/2021  ? Hyperglycemia 11/10/2020  ? Psoriasis 11/09/2020  ? Arthritis 07/05/2020  ? Bilateral occipital neuralgia 05/04/2020  ? Exertional headache 05/04/2020  ? Migraine without aura and without status migrainosus, not intractable 05/04/2020  ? History of COVID-19 02/24/2020  ? Bilateral carpal tunnel syndrome 10/19/2017  ? Colon cancer screening 12/16/2015  ? Hyperlipidemia, mixed 02/02/2015  ? Vitamin D deficiency 02/02/2015  ? ADD (attention deficit disorder) 02/02/2015  ? Allergy   ? Endometriosis 07/07/2014  ? Acute pulmonary embolism  5 wks post hysterectomy 07/07/2014  ? ? ?PCP: Mosie Lukes, MD ? ?REFERRING PROVIDER: Susa Day, MD  ? ?REFERRING DIAG: M54.2 (ICD-10-CM) - Cervical spine pain  ? ?THERAPY DIAG:  ?No diagnosis found. ? ?ONSET DATE: *** ? ?SUBJECTIVE:                                                                                                                                                                                                         ? ?SUBJECTIVE STATEMENT: ?*** ? ?PERTINENT HISTORY:  ?*** ? ?PAIN:  ?Are you having pain? {OPRCPAIN:27236} ? ?PRECAUTIONS: {Therapy precautions:24002} ? ?WEIGHT BEARING RESTRICTIONS {Yes ***/No:24003} ? ?FALLS:  ?Has patient fallen in last 6 months? {fallsyesno:27318} ? ?LIVING ENVIRONMENT: ?Lives with: {OPRC lives with:25569::"lives with their family"} ?Lives in: {Lives in:25570} ?Stairs: {opstairs:27293} ?Has following equipment at home: {Assistive devices:23999} ? ?OCCUPATION: *** ? ?PLOF: {PLOF:24004} ? ?PATIENT GOALS *** ? ?OBJECTIVE:  ? ?  DIAGNOSTIC FINDINGS:  ?*** ? ?PATIENT SURVEYS:  ?{rehab surveys:24030} ? ? ?COGNITION: ?Overall cognitive status: {cognition:24006} ? ? ?SENSATION: ?{sensation:27233} ? ?POSTURE:  ?*** ? ?PALPATION: ?***  ? ?CERVICAL ROM:  ? ?{AROM/PROM:27142} ROM A/PROM (deg) ?01/30/2022  ?Flexion   ?Extension   ?Right lateral flexion   ?Left lateral flexion   ?Right rotation   ?Left rotation   ? (Blank rows = not tested) ? ?UE ROM: ? ?{AROM/PROM:27142} ROM Right ?01/30/2022 Left ?01/30/2022  ?Shoulder flexion    ?Shoulder extension    ?Shoulder abduction    ?Shoulder adduction    ?Shoulder extension    ?Shoulder internal rotation    ?Shoulder external rotation    ?Elbow flexion    ?Elbow extension    ?Wrist flexion    ?Wrist extension    ?Wrist ulnar deviation    ?Wrist radial deviation    ?Wrist pronation    ?Wrist supination    ? (Blank rows = not tested) ? ?UE MMT: ? ?MMT Right ?01/30/2022 Left ?01/30/2022  ?Shoulder flexion    ?Shoulder extension    ?Shoulder abduction    ?Shoulder adduction    ?Shoulder extension    ?Shoulder internal rotation    ?Shoulder external rotation    ?Middle trapezius    ?Lower trapezius    ?Elbow flexion    ?Elbow extension    ?Wrist flexion    ?Wrist extension    ?Wrist ulnar deviation    ?Wrist radial deviation    ?Wrist pronation    ?Wrist supination    ?Grip strength    ? (Blank rows = not  tested) ? ?CERVICAL SPECIAL TESTS:  ?{Cervical special tests:25246} ? ? ?FUNCTIONAL TESTS:  ?{Functional tests:24029} ? ?PATIENT SURVEYS:  ?{rehab surveys:24030:a} ? ?TODAY'S TREATMENT:  ?*** ? ? ?PATIENT EDUCATION:  ?Education details: *** ?Person educated: {Person educated:25204} ?Education method: {Education Method:25205} ?Education comprehension: {Education Comprehension:25206} ? ? ?HOME EXERCISE PROGRAM: ?*** ? ?ASSESSMENT: ? ?CLINICAL IMPRESSION: ?Patient is a *** y.o. *** who was seen today for physical therapy evaluation and treatment for ***.  ? ? ?OBJECTIVE IMPAIRMENTS {opptimpairments:25111}.  ? ?ACTIVITY LIMITATIONS {activity limitations:25113}.  ? ?PERSONAL FACTORS {Personal factors:25162} are also affecting patient's functional outcome.  ? ? ?REHAB POTENTIAL: {rehabpotential:25112} ? ?CLINICAL DECISION MAKING: {clinical decision making:25114} ? ?EVALUATION COMPLEXITY: {Evaluation complexity:25115} ? ? ?GOALS: ?Goals reviewed with patient? {yes/no:20286} ? ?SHORT TERM GOALS: Target date: {follow up:25551} ? ?*** ?Baseline: *** ?Goal status: {GOALSTATUS:25110} ? ?2.  *** ?Baseline: *** ?Goal status: {GOALSTATUS:25110} ? ?3.  *** ?Baseline: *** ?Goal status: {GOALSTATUS:25110} ? ?4.  *** ?Baseline: *** ?Goal status: {GOALSTATUS:25110} ? ?5.  *** ?Baseline: *** ?Goal status: {GOALSTATUS:25110} ? ?6.  *** ?Baseline: *** ?Goal status: {GOALSTATUS:25110} ? ?LONG TERM GOALS: Target date: {follow up:25551} ? ?*** ?Baseline: *** ?Goal status: {GOALSTATUS:25110} ? ?2.  *** ?Baseline: *** ?Goal status: {GOALSTATUS:25110} ? ?3.  *** ?Baseline: *** ?Goal status: {GOALSTATUS:25110} ? ?4.  *** ?Baseline: *** ?Goal status: {GOALSTATUS:25110} ? ?5.  *** ?Baseline: *** ?Goal status: {GOALSTATUS:25110} ? ?6.  *** ?Baseline: *** ?Goal status: {GOALSTATUS:25110} ? ? ?PLAN: ?PT FREQUENCY: {rehab frequency:25116} ? ?PT DURATION: {rehab duration:25117} ? ?PLANNED INTERVENTIONS: {rehab planned interventions:25118::"Therapeutic  exercises","Therapeutic activity","Neuromuscular re-education","Balance training","Gait training","Patient/Family education","Joint mobilization"} ? ?PLAN FOR NEXT SESSION: *** ? ? ?Pressley Tadesse, PT ?01/30/2022, 8:23 AM ? ? ? ?  ?

## 2022-01-31 ENCOUNTER — Encounter: Payer: Self-pay | Admitting: Physical Therapy

## 2022-01-31 ENCOUNTER — Ambulatory Visit: Payer: 59 | Admitting: Physical Therapy

## 2022-01-31 DIAGNOSIS — R293 Abnormal posture: Secondary | ICD-10-CM

## 2022-01-31 DIAGNOSIS — M6281 Muscle weakness (generalized): Secondary | ICD-10-CM

## 2022-01-31 DIAGNOSIS — M542 Cervicalgia: Secondary | ICD-10-CM

## 2022-01-31 DIAGNOSIS — R252 Cramp and spasm: Secondary | ICD-10-CM

## 2022-01-31 NOTE — Therapy (Signed)
?OUTPATIENT PHYSICAL THERAPY TREATMENT NOTE ? ? ?Patient Name: Whitney Trujillo ?MRN: [id number] ?DOB:11-18-1975, 46 y.o., female ?Today's Date: 01/31/2022 ? ?PCP: Bradd Canary, MD ?REFERRING PROVIDER: Jene Every, MD ? ? PT End of Session - 01/31/22 1400   ? ? Visit Number 10   ? Number of Visits 12   ? Date for PT Re-Evaluation 02/09/22   ? Authorization Type [employer]   ? Progress Note Due on Visit 10   ? PT Start Time 1400   ? PT Stop Time 1430   PT was 15 min late due to previous patient needing clinic transfer and referral  ? PT Time Calculation (min) 30 min   ? Activity Tolerance Patient tolerated treatment well   ? Behavior During Therapy [facility] for tasks assessed/performed   ? ?  ?  ? ?  ? ? ?Past Medical History:  ?Diagnosis Date  ? Acute right lower quadrant pain 01/25/2016  ? ADD (attention deficit disorder) 02/02/2015  ? Allergy   ? seasonal allergies  ? Anxiety   ? Depression with anxiety 03/15/2016  ? Endometriosis   ? Gallstones   ? Hyperlipidemia, mixed 02/02/2015  ? Migraine 12/16/2015  ? Preventative health care 12/16/2015  ? Pulmonary emboli (HCC)   ? b/l PE after hysterectomy, 5 weeks post op, clotting problem ruled out at [facility]  ? ?Past Surgical History:  ?Procedure Laterality Date  ? ABDOMINAL HYSTERECTOMY    ? ovaries removed, in 2 separate procedures  ? CESAREAN SECTION  2007, 2009  ? CHOLECYSTECTOMY    ? CYSTOSCOPY  10/2014  ? EYE SURGERY    ? lasik b/l  ? LAPAROTOMY    ? Endometriosis  ? WISDOM TOOTH EXTRACTION    ? ?Patient Active Problem List  ? Diagnosis Date Noted  ? Gross hematuria 05/03/2021  ? Flank pain 05/03/2021  ? Hyperglycemia 11/10/2020  ? Psoriasis 11/09/2020  ? Arthritis 07/05/2020  ? Bilateral occipital neuralgia 05/04/2020  ? Exertional headache 05/04/2020  ? Migraine without aura and without status migrainosus, not intractable 05/04/2020  ? History of COVID-19 02/24/2020  ? Bilateral carpal tunnel syndrome 10/19/2017  ? Colon cancer screening 12/16/2015  ? Hyperlipidemia, mixed  02/02/2015  ? Vitamin D deficiency 02/02/2015  ? ADD (attention deficit disorder) 02/02/2015  ? Allergy   ? Endometriosis 07/07/2014  ? Acute pulmonary embolism  5 wks post hysterectomy 07/07/2014  ? ? ?REFERRING DIAG: M54.2 (ICD-10-CM) - Cervical spine pain ? ?THERAPY DIAG:  ?Cervicalgia ? ?Muscle weakness (generalized) ? ?Abnormal posture ? ?Cramp and spasm ? ?PERTINENT HISTORY: see Media for latest MRI ? ?PRECAUTIONS: no ? ?SUBJECTIVE: Patient having increased stiffness in entire back today. 4/10. ? ?PAIN:  ?Are you having pain? Yes: NPRS scale: 4/10 ?Pain location: neck/spine ?Pain description: stiffness ?Aggravating factors: unknown ?Relieving factors: nothing ? ? ? ? ?TODAY'S TREATMENT:  ?01/31/22 ?UBE x 6 min L2 34fw/3bw ?Nerve glides x 3 bil (prayer); also did one ulnar nerve hand upside down ?Server stretch x 10 5 sec hold with axial extension ?Reviewed thoracic extension over foam roll ?Supine neck rotation on small noodle x 3 ea ? ?Manual Therapy: CPA mobs lumbar, thoracic gd II/III; UPA L and R to cervical spine; STM to bil cervical paraspinals and UTs; OA release  and manual traction x 20 sec which increased tingling today. ? ? ?PATIENT EDUCATION: ?Education details: server stretch ?Person educated: Patient ?Education method: Explanation and Demonstration ?Education comprehension: verbalized understanding and returned demonstration ? ? ?HOME EXERCISE PROGRAM: ?[id number] ? ?  PT Short Term Goals - 01/06/22 0905   ? ?  ? PT SHORT TERM GOAL #1  ? Title Ind with initial HEP   ? Time 2   ? Period Weeks   ? Status Achieved   ? Target Date 01/12/22   ? ?  ?  ? ?  ? ? ? PT Long Term Goals - 01/02/22 1620   ? ?  ? PT LONG TERM GOAL #1  ? Title Ind with progressed HEP to improve posture and outcomes.   ? Time 6   ? Period Weeks   ? Status On-going   ? Target Date 02/09/22   ?  ? PT LONG TERM GOAL #2  ? Title Pt. will demonstrate improved cervical AROM with 60 deg without pain all directions for safety with driving.    ? Baseline see flowsheet, very slow gaurded painful movements.   ? Time 6   ? Period Weeks   ? Status On-going   ? Target Date 02/09/22   ?  ? PT LONG TERM GOAL #3  ? Title Pt. will demonstrate improved functional ability as demonstrated by 60% on FOTO.   ? Baseline 34% on FOTO = 66% disability   ? Time 6   ? Period Weeks   ? Status On-going   ? Target Date 02/09/22   ?  ? PT LONG TERM GOAL #4  ? Title Pt. will report 75% improvement in neck/upper back pain to improve QOL   ? Time 6   ? Period Weeks   ? Status On-going   ? Target Date 02/09/22   ? ?  ?  ? ?  ? ? ? Plan - 01/31/22 1455   ? ? Clinical Impression Statement Whitney Trujillo presents today with complaints of stiffness throughout her entire spine. She brought in her MRI which was scanned into media. She is having a NCV test tomorrow. Treatment was short today due to delay with previous pt of treating PT. Pt had good spinal mobility in lumbar and thoracic, but did have spontaneous manipulation with gd III mobs in mid thoracic. Still with stiffness and pain with UPA mobs to cspine mainly at C7/T1 and C2/3. She continues to have tingling in left forearm and face intermittently. She will continue to benefit from skilled PT to address these deficits.   ? Personal Factors and Comorbidities Comorbidity 3+;Profession   ? Comorbidities arthritis, psoriasis, chronic neck pain, carpal tunnel syndrome   ? Examination-Activity Limitations Bathing;Carry;Lift;Reach Overhead;Dressing;Caring for Others   ? Examination-Participation Restrictions Driving;Cleaning;Meal Prep;Community Activity;Laundry;Occupation;Shop   ? Stability/Clinical Decision Making Evolving/Moderate complexity   ? Rehab Potential Good   ? PT Frequency 2x / week   ? PT Duration 6 weeks   ? PT Treatment/Interventions ADLs/Self Care Home Management;Cryotherapy;Electrical Stimulation;Moist Heat;Traction;Ultrasound;Functional mobility training;Therapeutic activities;Therapeutic exercise;Balance training;Neuromuscular  re-education;Patient/family education;Manual techniques;Passive range of motion;Dry needling;Spinal Manipulations;Joint Manipulations;Taping   ? PT Next Visit Plan HEP for postural strengthening, modalities/manual therapy PRN   ? PT Home Exercise Plan [id number]   ? Consulted and Agree with Plan of Care Patient   ? ?  ?  ? ?  ? ? ? ? ?Crewe Heathman, PT ?01/31/2022, 3:02 PM ? ?  ? ?

## 2022-02-03 ENCOUNTER — Emergency Department (HOSPITAL_COMMUNITY): Payer: 59

## 2022-02-03 ENCOUNTER — Observation Stay (HOSPITAL_BASED_OUTPATIENT_CLINIC_OR_DEPARTMENT_OTHER)
Admission: EM | Admit: 2022-02-03 | Discharge: 2022-02-04 | Disposition: A | Discharge status: Home / Self Care | Payer: 59 | Attending: Orthopedic Surgery | Admitting: Orthopedic Surgery

## 2022-02-03 ENCOUNTER — Emergency Department (HOSPITAL_BASED_OUTPATIENT_CLINIC_OR_DEPARTMENT_OTHER): Payer: 59

## 2022-02-03 ENCOUNTER — Other Ambulatory Visit: Payer: Self-pay

## 2022-02-03 ENCOUNTER — Ambulatory Visit: Payer: 59 | Admitting: Physical Therapy

## 2022-02-03 ENCOUNTER — Encounter: Payer: Self-pay | Admitting: Physical Therapy

## 2022-02-03 ENCOUNTER — Encounter (HOSPITAL_BASED_OUTPATIENT_CLINIC_OR_DEPARTMENT_OTHER): Payer: Self-pay | Admitting: Emergency Medicine

## 2022-02-03 DIAGNOSIS — Z86711 Personal history of pulmonary embolism: Secondary | ICD-10-CM | POA: Diagnosis not present

## 2022-02-03 DIAGNOSIS — Z91040 Latex allergy status: Secondary | ICD-10-CM | POA: Diagnosis not present

## 2022-02-03 DIAGNOSIS — R293 Abnormal posture: Secondary | ICD-10-CM

## 2022-02-03 DIAGNOSIS — R200 Anesthesia of skin: Secondary | ICD-10-CM

## 2022-02-03 DIAGNOSIS — Z79899 Other long term (current) drug therapy: Secondary | ICD-10-CM | POA: Diagnosis not present

## 2022-02-03 DIAGNOSIS — R531 Weakness: Secondary | ICD-10-CM

## 2022-02-03 DIAGNOSIS — Z8616 Personal history of COVID-19: Secondary | ICD-10-CM | POA: Diagnosis not present

## 2022-02-03 DIAGNOSIS — M542 Cervicalgia: Secondary | ICD-10-CM

## 2022-02-03 DIAGNOSIS — Z85038 Personal history of other malignant neoplasm of large intestine: Secondary | ICD-10-CM | POA: Diagnosis not present

## 2022-02-03 DIAGNOSIS — L309 Dermatitis, unspecified: Secondary | ICD-10-CM | POA: Insufficient documentation

## 2022-02-03 DIAGNOSIS — M4802 Spinal stenosis, cervical region: Secondary | ICD-10-CM

## 2022-02-03 DIAGNOSIS — R252 Cramp and spasm: Secondary | ICD-10-CM

## 2022-02-03 DIAGNOSIS — M6281 Muscle weakness (generalized): Secondary | ICD-10-CM

## 2022-02-03 DIAGNOSIS — R292 Abnormal reflex: Secondary | ICD-10-CM

## 2022-02-03 DIAGNOSIS — H21561 Pupillary abnormality, right eye: Secondary | ICD-10-CM | POA: Diagnosis not present

## 2022-02-03 HISTORY — DX: Weakness: R53.1

## 2022-02-03 LAB — TROPONIN I (HIGH SENSITIVITY)
Troponin I (High Sensitivity): <2 ng/L (ref <18)
Troponin I (High Sensitivity): 2 ng/L (ref <18)

## 2022-02-03 LAB — COMPREHENSIVE METABOLIC PANEL
ALT: 23 U/L (ref 0–44)
AST: 20 U/L (ref 15–41)
Albumin: 4.5 g/dL (ref 3.5–5.0)
Alkaline Phosphatase: 31 U/L — ABNORMAL LOW (ref 38–126)
Anion gap: 8 (ref 5–15)
BUN: 20 mg/dL (ref 6–20)
CO2: 26 mmol/L (ref 22–32)
Calcium: 9.7 mg/dL (ref 8.9–10.3)
Chloride: 104 mmol/L (ref 98–111)
Creatinine, Ser: 0.79 mg/dL (ref 0.44–1.00)
GFR, Estimated: >60 mL/min (ref >60)
Glucose, Bld: 101 mg/dL — ABNORMAL HIGH (ref 70–99)
Potassium: 3.8 mmol/L (ref 3.5–5.1)
Sodium: 138 mmol/L (ref 135–145)
Total Bilirubin: 1.2 mg/dL (ref 0.3–1.2)
Total Protein: 7.2 g/dL (ref 6.5–8.1)

## 2022-02-03 LAB — URINALYSIS, ROUTINE W REFLEX MICROSCOPIC
Bilirubin Urine: NEGATIVE
Glucose, UA: NEGATIVE mg/dL
Ketones, ur: NEGATIVE mg/dL
Leukocytes,Ua: NEGATIVE
Nitrite: NEGATIVE
Protein, ur: NEGATIVE mg/dL
Specific Gravity, Urine: 1.01 (ref 1.005–1.030)
pH: 7 (ref 5.0–8.0)

## 2022-02-03 LAB — CBC
HCT: 42.8 % (ref 36.0–46.0)
Hemoglobin: 14.9 g/dL (ref 12.0–15.0)
MCH: 30.1 pg (ref 26.0–34.0)
MCHC: 34.8 g/dL (ref 30.0–36.0)
MCV: 86.5 fL (ref 80.0–100.0)
Platelets: 265 10*3/uL (ref 150–400)
RBC: 4.95 MIL/uL (ref 3.87–5.11)
RDW: 11.9 % (ref 11.5–15.5)
WBC: 6.9 10*3/uL (ref 4.0–10.5)
nRBC: 0 % (ref 0.0–0.2)

## 2022-02-03 LAB — CBG MONITORING, ED: Glucose-Capillary: 90 mg/dL (ref 70–99)

## 2022-02-03 LAB — URINALYSIS, MICROSCOPIC (REFLEX)

## 2022-02-03 LAB — RAPID URINE DRUG SCREEN, HOSP PERFORMED
Amphetamines: NOT DETECTED
Barbiturates: NOT DETECTED
Benzodiazepines: NOT DETECTED
Cocaine: NOT DETECTED
Opiates: NOT DETECTED
Tetrahydrocannabinol: NOT DETECTED

## 2022-02-03 LAB — VITAMIN B12: Vitamin B-12: 552 pg/mL (ref 180–914)

## 2022-02-03 MED ORDER — GADOBUTROL 1 MMOL/ML IV SOLN
6.0000 mL | Freq: Once | INTRAVENOUS | Status: AC | PRN
  Administered 2022-02-03: 6 mL via INTRAVENOUS

## 2022-02-03 MED ORDER — LORAZEPAM 2 MG/ML IJ SOLN
0.5000 mg | Freq: Once | INTRAMUSCULAR | Status: AC
  Administered 2022-02-03: 0.5 mg via INTRAVENOUS
  Filled 2022-02-03: qty 1

## 2022-02-03 MED ORDER — ACETAMINOPHEN 650 MG RE SUPP: 650.0000 mg | Freq: Four times a day (QID) | RECTAL | Status: DC | PRN

## 2022-02-03 MED ORDER — ONDANSETRON HCL 4 MG/2ML IJ SOLN: 4.0000 mg | Freq: Four times a day (QID) | INTRAMUSCULAR | Status: DC | PRN

## 2022-02-03 MED ORDER — IOHEXOL 350 MG/ML SOLN
75.0000 mL | Freq: Once | INTRAVENOUS | Status: AC | PRN
  Administered 2022-02-03: 75 mL via INTRAVENOUS

## 2022-02-03 MED ORDER — ACETAMINOPHEN 325 MG PO TABS: 650.0000 mg | ORAL_TABLET | Freq: Four times a day (QID) | ORAL | Status: DC | PRN

## 2022-02-03 MED ORDER — ONDANSETRON HCL 4 MG PO TABS: 4.0000 mg | ORAL_TABLET | Freq: Four times a day (QID) | ORAL | Status: DC | PRN

## 2022-02-03 NOTE — ED Provider Notes (Signed)
?[facility] EMERGENCY DEPARTMENT ?Provider Note ? ? ?CSN: [id number] ?Arrival date & time: 02/03/22  1029 ? ?  ? ?History ?Chief Complaint  ?Patient presents with  ? Tingling  ? ? ?Whitney Trujillo is a 46 y.o. female. ? ?Patient presents from physical therapy after having worsening left-sided weakness, pain,numbness and tingling. Reports that these symptoms first started 2 months and has been worsening since. They first started in her left arm at least 2 years ago and then 2 months ago progressed to her left leg. Says she has been tripping over her leg more and falling more lately and was working with PT after a recent fall. Sees ortho who conducted an electrical stimulation testing a few days ago. She is supposed to see a neurologist on 5/22. No known family history of MS but her mother has similar symptoms and has not been diagnosed with anything yet. Lives with her husband and children who have noticed her becoming more forgetful. Endorses headaches and vision changes with these changes over the past 2 months, she has a long-time history of headaches with phonophobia and photophobia but they usually start from her neck and travel up but these new headaches over the past 2 months start from her back and radiate to the back of her head. Endorses chest pain at times. 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 Denies head trauma or injury. Also endorses dysphagia that started 2 months ago as well but is able to eat fine.  ? ?The history is provided by the patient. No language interpreter was used.  ? ?  ? ?Home Medications ?Prior to Admission medications   ?Medication Sig Start Date End Date Taking? Authorizing Provider  ?Dupilumab (DUPIXENT) 300 MG/2ML SOPN Inject 300 mg into the skin every 14 (fourteen) days. 05/09/21   Bradd Canary, MD  ?FLUoxetine (PROZAC) 20 MG capsule Take 1 capsule (20 mg total) by mouth daily. 12/12/21   Clayborne Dana, NP  ?meloxicam (MOBIC) 15  MG tablet Take 1 tablet (15 mg total) by mouth daily. 12/12/21   Clayborne Dana, NP  ?   ? ?Allergies    ?Latex and Gluten meal   ? ?Review of Systems   ?Review of Systems  ?Constitutional:  Negative for activity change, appetite change, chills and fever.  ?HENT:  Positive for tinnitus (left-sided). Negative for congestion.   ?     Positive for dysphagia   ?Eyes:  Positive for visual disturbance.  ?Respiratory:  Positive for chest tightness. Negative for shortness of breath.   ?Cardiovascular:  Positive for chest pain. Negative for palpitations and leg swelling.  ?Gastrointestinal:  Negative for abdominal pain, nausea and vomiting.  ?Genitourinary:  Negative for dysuria.  ?Musculoskeletal:  Positive for neck pain. Negative for gait problem.  ?Neurological:  Positive for numbness and headaches. Negative for tremors and speech difficulty.  ?Psychiatric/Behavioral:  Negative for confusion.   ? ?Physical Exam ?Updated Vital Signs ?BP 139/88   Pulse 80   Temp 98.2 ?F (36.8 ?C) (Oral)   Resp 16   Ht 4\' 11"  (1.499 m)   Wt 59 kg   SpO2 100%   BMI 26.26 kg/m?  ?Physical Exam ?Vitals reviewed.  ?Constitutional:   ?   General: She is not in acute distress. ?   Appearance: Normal appearance. She is not toxic-appearing.  ?HENT:  ?   Head: Normocephalic and atraumatic.  ?   Right Ear: External ear normal.  ?  Left Ear: External ear normal.  ?   Nose: Nose normal. No congestion or rhinorrhea.  ?   Mouth/Throat:  ?   Mouth: Mucous membranes are moist.  ?   Pharynx: Oropharynx is clear.  ?Eyes:  ?   General: No scleral icterus.    ?   Right eye: No discharge.     ?   Left eye: No discharge.  ?   Extraocular Movements: Extraocular movements intact.  ?   Conjunctiva/sclera: Conjunctivae normal.  ?   Pupils: Pupils are equal, round, and reactive to light.  ?Cardiovascular:  ?   Rate and Rhythm: Normal rate and regular rhythm.  ?   Pulses: Normal pulses.  ?   Heart sounds: Normal heart sounds. No murmur heard. ?  No gallop.   ?Pulmonary:  ?   Effort: Pulmonary effort is normal. No respiratory distress.  ?   Breath sounds: Normal breath sounds. No wheezing, rhonchi or rales.  ?Abdominal:  ?   General: Bowel sounds are normal. There is no distension.  ?   Palpations: Abdomen is soft.  ?   Tenderness: There is abdominal tenderness (patient says she always has generalized abdominal tenderness). There is no guarding.  ?Musculoskeletal:  ?   Cervical back: Normal range of motion and neck supple. No tenderness.  ?   Right lower leg: No edema.  ?   Left lower leg: No edema.  ?Lymphadenopathy:  ?   Cervical: No cervical adenopathy.  ?Skin: ?   General: Skin is warm and dry.  ?   Capillary Refill: Capillary refill takes less than 2 seconds.  ?   Coloration: Skin is not pale.  ?   Findings: No erythema.  ?Neurological:  ?   Mental Status: She is alert and oriented to person, place, and time.  ?   Cranial Nerves: No cranial nerve deficit.  ?   Sensory: Sensory deficit (decreased facial, UE and LE sensation on the left side) present.  ?   Motor: Weakness (4/5 UE and 3/5 LE strength on the left) present.  ?   Coordination: Coordination normal.  ?Psychiatric:     ?   Mood and Affect: Mood normal.     ?   Behavior: Behavior normal.  ? ? ?ED Results / Procedures / Treatments   ?Labs ?(all labs ordered are listed, but only abnormal results are displayed) ?Labs Reviewed  ?COMPREHENSIVE METABOLIC PANEL - Abnormal; Notable for the following components:  ?    Result Value  ? Glucose, Bld 101 (*)   ? Alkaline Phosphatase 31 (*)   ? All other components within normal limits  ?URINALYSIS, ROUTINE W REFLEX MICROSCOPIC - Abnormal; Notable for the following components:  ? Hgb urine dipstick TRACE (*)   ? All other components within normal limits  ?URINALYSIS, MICROSCOPIC (REFLEX) - Abnormal; Notable for the following components:  ? Bacteria, UA RARE (*)   ? All other components within normal limits  ?CBC  ?RAPID URINE DRUG SCREEN, HOSP PERFORMED  ?CBG MONITORING,  ED  ?TROPONIN I (HIGH SENSITIVITY)  ?TROPONIN I (HIGH SENSITIVITY)  ? ? ?EKG ?EKG Interpretation ? ?Date/Time:  Friday Feb 03 2022 10:58:53 EDT ?Ventricular Rate:  77 ?PR Interval:  149 ?QRS Duration: 100 ?QT Interval:  374 ?QTC Calculation: 424 ?R Axis:   64 ?Text Interpretation: Sinus rhythm RSR' in V1 or V2, right VCD or RVH No significant change since last tracing Confirmed by Alvira MondaySchlossman, Erin ([id number]) on 02/03/2022 11:08:51 AM ? ?Radiology ?CT Head  Wo Contrast ? ?Result Date: 02/03/2022 ?CLINICAL DATA:  Bilateral upper extremity numbness. EXAM: CT HEAD WITHOUT CONTRAST TECHNIQUE: Contiguous axial images were obtained from the base of the skull through the vertex without intravenous contrast. RADIATION DOSE REDUCTION: This exam was performed according to the departmental dose-optimization program which includes automated exposure control, adjustment of the mA and/or kV according to patient size and/or use of iterative reconstruction technique. COMPARISON:  March 07, 2020. FINDINGS: Brain: No evidence of acute infarction, hemorrhage, hydrocephalus, extra-axial collection or mass lesion/mass effect. Vascular: No hyperdense vessel or unexpected calcification. Skull: Normal. Negative for fracture or focal lesion. Sinuses/Orbits: No acute finding. Other: None. IMPRESSION: No acute intracranial abnormality seen. Electronically Signed   By: Lupita Raider M.D.   On: 02/03/2022 11:41  ? ?DG Chest Portable 1 View ? ?Result Date: 02/03/2022 ?CLINICAL DATA:  Chest pain EXAM: PORTABLE CHEST 1 VIEW COMPARISON:  05/04/2010 FINDINGS: Numerous leads and wires project over the chest. Midline trachea. Normal heart size and mediastinal contours. No pleural effusion or pneumothorax. Clear lungs. IMPRESSION: No active disease. Electronically Signed   By: Jeronimo Greaves M.D.   On: 02/03/2022 11:46   ? ?Procedures ?Procedures  ?None ? ?Medications Ordered in ED ?Medications - No data to display ? ?ED Course/ Medical Decision Making/ A&P ?   ?                        ?Medical Decision Making ?Amount and/or Complexity of Data Reviewed ?Labs: ordered. ?Radiology: ordered. ? ? ? ?Patient presents with what seems to be progressive left-sided weakness, nu

## 2022-02-03 NOTE — ED Notes (Signed)
X-ray at bedside

## 2022-02-03 NOTE — ED Notes (Signed)
Patient transported to CT 

## 2022-02-03 NOTE — Therapy (Signed)
?OUTPATIENT PHYSICAL THERAPY TREATMENT NOTE ? ? ?Patient Name: Berton MountMelissa Ann Swords ?MRN: [id number] ?DOB:31-Jul-1976, 46 y.o., female ?Today's Date: 02/03/2022 ? ?PCP: Bradd CanaryBlyth, Stacey A, MD ?REFERRING PROVIDER: Jene EveryBeane, Jeffrey, MD ? ? PT End of Session - 02/03/22 1009   ? ? Visit Number --   ? Number of Visits 12   ? Date for PT Re-Evaluation 02/09/22   ? Authorization Type [employer]   ? Progress Note Due on Visit 10   ? PT Start Time 1015   ? PT Stop Time 1020   ? PT Time Calculation (min) 5 min   ? Activity Tolerance Patient tolerated treatment well   ? Behavior [facility] for tasks assessed/performed   ? ?  ?  ? ?  ? ? ?Past Medical History:  ?Diagnosis Date  ? Acute right lower quadrant pain 01/25/2016  ? ADD (attention deficit disorder) 02/02/2015  ? Allergy   ? seasonal allergies  ? Anxiety   ? Depression with anxiety 03/15/2016  ? Endometriosis   ? Gallstones   ? Hyperlipidemia, mixed 02/02/2015  ? Migraine 12/16/2015  ? Preventative health care 12/16/2015  ? Pulmonary emboli (HCC)   ? b/l PE after hysterectomy, 5 weeks post op, [facility]  ? ?Past Surgical History:  ?Procedure Laterality Date  ? ABDOMINAL HYSTERECTOMY    ? ovaries removed, in 2 separate procedures  ? CESAREAN SECTION  2007, 2009  ? CHOLECYSTECTOMY    ? CYSTOSCOPY  10/2014  ? EYE SURGERY    ? lasik b/l  ? LAPAROTOMY    ? Endometriosis  ? WISDOM TOOTH EXTRACTION    ? ?Patient Active Problem List  ? Diagnosis Date Noted  ? Gross hematuria 05/03/2021  ? Flank pain 05/03/2021  ? Hyperglycemia 11/10/2020  ? Psoriasis 11/09/2020  ? Arthritis 07/05/2020  ? Bilateral occipital neuralgia 05/04/2020  ? Exertional headache 05/04/2020  ? Migraine without aura and without status migrainosus, not intractable 05/04/2020  ? History of COVID-19 02/24/2020  ? Bilateral carpal tunnel syndrome 10/19/2017  ? Colon cancer screening 12/16/2015  ? Hyperlipidemia, mixed 02/02/2015  ? Vitamin D deficiency 02/02/2015  ? ADD (attention deficit disorder)  02/02/2015  ? Allergy   ? Endometriosis 07/07/2014  ? Acute pulmonary embolism  5 wks post hysterectomy 07/07/2014  ? ? ?REFERRING DIAG: M54.2 (ICD-10-CM) - Cervical spine pain ? ?THERAPY DIAG:  ?Cervicalgia ? ?Muscle weakness (generalized) ? ?Abnormal posture ? ?Cramp and spasm ? ?PERTINENT HISTORY: see Media for latest MRI ? ?PRECAUTIONS: no ? ?SUBJECTIVE: Patient arrived today reporting new onset of left sided numbness and weakness in LLE.  She reported she was seen at ortho yesterday who recommended lumbar MRI, but were not able to perform due to MRI machine broken.  She reports symptoms have worsened overnight, with difficulty walking, difficulty raising L leg, numbness in L side of face and down arm.  She thinks she needs to go to ED and wanted PT opinion.  ? ?PAIN:  ?Are you having pain?  Not assessed today.  ? ? ?OBJECTIVE: (objective measures completed at initial evaluation unless otherwise dated) ?FOTO: neck: 34%, predicted outcome 60% after 12 visits.  12/29/2021  ?   ?Sensation  ?Light Touch Appears Intact   ?Additional Comments tingling in 4th finger bil, reports R side feels "heavier" but sensation intact   ?   ?Posture/Postural Control  ?Posture/Postural Control Postural limitations   ?Postural Limitations Rounded Shoulders   ?Posture Comments decreased cervical lordosis   ?   ?  ROM / Strength  ?AROM / PROM / Strength AROM;Strength   ?   ?AROM  ?Overall AROM  Deficits   ?Overall AROM Comments extremely slow guarded movements, tested in sitting.  Shoulder ROM WNL and symmetric bil.   ?AROM Assessment Site Cervical   ?Cervical Flexion 50   very tight, gets zapping sensation.  ?Cervical Extension 40   ?Cervical - Right Rotation 45   ?Cervical - Left Rotation 45   ?   ?Strength  ?Overall Strength Deficits   ?Overall Strength Comments tested in sitting   ?Strength Assessment Site Shoulder;Elbow;Wrist;Hand   ?Right/Left Shoulder Right;Left   ?Right Shoulder Flexion 4+/5   pain  ?Right Shoulder ABduction 5/5    ?Right Shoulder Internal Rotation 4+/5   ?Right Shoulder External Rotation 5/5   ?Left Shoulder Flexion 5/5   ?Left Shoulder ABduction 5/5   ?Left Shoulder Internal Rotation 4+/5   ?Left Shoulder External Rotation 5/5   ?Right/Left Elbow Right;Left   ?Right Elbow Flexion 4+/5   ?Right Elbow Extension 4/5   ?Left Elbow Flexion 5/5   ?Left Elbow Extension 5/5   ?Right/Left Wrist Right;Left   ?Right Wrist Flexion 5/5   ?Right Wrist Extension 5/5   ?Left Wrist Flexion 5/5   ?Left Wrist Extension 5/5   ?Right/Left hand Right;Left   ?Right Hand Gross Grasp Functional   ?Left Hand Gross Grasp Functional   ?   ?Palpation  ?Spinal mobility hypomobility throughout cervical spine   ?Palpation comment Tenderness/trigger points bil UT, levator scapulae, cervical paraspinals   ? ? ? ?TODAY'S TREATMENT:  ?02/03/22- No interventions.  ? ?01/31/22 ?UBE x 6 min L2 79fw/3bw ?Nerve glides x 3 bil (prayer); also did one ulnar nerve hand upside down ?Server stretch x 10 5 sec hold with axial extension ?Reviewed thoracic extension over foam roll ?Supine neck rotation on small noodle x 3 ea ? ?Manual Therapy: CPA mobs lumbar, thoracic gd II/III; UPA L and R to cervical spine; STM to bil cervical paraspinals and UTs; OA release  and manual traction x 20 sec which increased tingling today. ? ? ?PATIENT EDUCATION: ?Education details: server stretch ?Person educated: Patient ?Education method: Explanation and Demonstration ?Education comprehension: verbalized understanding and returned demonstration ? ? ?HOME EXERCISE PROGRAM: ?[id number] ? ?ASSESSMENT: ?Clinical Impression: Coila Wardell reports sudden onset of left sided weakness and numbness.  Due to rapid progression of these new onset symptoms, recommended that she go to ED for evaluation.  She was then taken in w/c downstairs.   ? ? PT Short Term Goals - 01/06/22 0905   ? ?  ? PT SHORT TERM GOAL #1  ? Title Ind with initial HEP   ? Time 2   ? Period Weeks   ? Status Achieved   ? Target  Date 01/12/22   ? ?  ?  ? ?  ? ? ? PT Long Term Goals - 01/02/22 1620   ? ?  ? PT LONG TERM GOAL #1  ? Title Ind with progressed HEP to improve posture and outcomes.   ? Time 6   ? Period Weeks   ? Status On-going   ? Target Date 02/09/22   ?  ? PT LONG TERM GOAL #2  ? Title Pt. will demonstrate improved cervical AROM with 60 deg without pain all directions for safety with driving.   ? Baseline see flowsheet, very slow gaurded painful movements.   ? Time 6   ? Period Weeks   ? Status On-going   ?  Target Date 02/09/22   ?  ? PT LONG TERM GOAL #3  ? Title Pt. will demonstrate improved functional ability as demonstrated by 60% on FOTO.   ? Baseline 34% on FOTO = 66% disability   ? Time 6   ? Period Weeks   ? Status On-going   ? Target Date 02/09/22   ?  ? PT LONG TERM GOAL #4  ? Title Pt. will report 75% improvement in neck/upper back pain to improve QOL   ? Time 6   ? Period Weeks   ? Status On-going   ? Target Date 02/09/22   ? ?  ?  ? ?  ? ?PLAN:  ? ?  PT Frequency 2x / week   ?  PT Duration 6 weeks   ?  PT Treatment/Interventions ADLs/Self Care Home Management;Cryotherapy;Electrical Stimulation;Moist Heat;Traction;Ultrasound;Functional mobility training;Therapeutic activities;Therapeutic exercise;Balance training;Neuromuscular re-education;Patient/family education;Manual techniques;Passive range of motion;Dry needling;Spinal Manipulations;Joint Manipulations;Taping   ?  PT Next Visit Plan D/c or hold depending on evaluation of new symptoms.   ?  ?   ?  ?  ?   ?  ?  ?Patient will benefit from skilled therapeutic intervention in order to improve the following deficits and impairments:  Decreased activity tolerance, Decreased range of motion, Decreased strength, Dizziness, Hypomobility, Increased fascial restricitons, Impaired UE functional use, Pain, Postural dysfunction, Impaired flexibility, Increased muscle spasms, Decreased mobility ? ? ? ? ?Jena Gauss, PT, DPT  ?02/03/2022, 10:38 AM ? ?  ? ?

## 2022-02-03 NOTE — ED Provider Notes (Signed)
6:55 PM-Case discussed with neuro hospitalist, Dr. Quinn Axe, who states that patient requires hospitalization, admission by hospitalist and she will follow along with the neuro service.  MRI images not yet done, she has deleted the lumbar spine, and anticipate that these will be done at some point following admission. ? ?Plan-consult hospitalist to admit for weakness, left-sided, including arm and leg.  Apparently differential diagnosis includes CNS abnormalities of brain, cervical and thoracic spine.  She requires extensive MRI imaging, which cannot be readily obtained.  The symptoms are reported to be progressive over several days, and have placed her at risk for falling.  Her disability requires that she be hospitalized. ? ?7:21 PM-Consult complete with Dr. Bridgett Larsson. Patient case explained and discussed.  He agrees to admit patient for further evaluation and treatment. Call ended at 7:50 PM.  He plans on seeing the patient after completion of her imaging to make sure that the pertinent findings are attended to. ? ?  ?Daleen Bo, MD ?02/03/22 2251 ? ?

## 2022-02-03 NOTE — Assessment & Plan Note (Addendum)
Patient admitted for further evaluation.. Pt's MRI head, Cervical, thoracic and lumbar spine do not explain pt consistent symptoms of weakness and neuropathy. Workup per neurology. Pt without any MS lesions on her MRI.  Also interesting in that pt has high arched feet and her initial symptoms was a left foot drop.  Charcot-marie-tooth disease I think is also in the differential. Pt did have an EMG performed at ortho office but not sure if this included nerve conduction. Pt has normal and maybe slightly hyperreflexic DTR which would argue against CMT. ?-Patient placed on Neurontin 100 mg 3 times daily as needed as patient stated home regimen of Neurontin 300 mg made her feel sleepy/tired.  ?-HIV done was nonreactive.   ?-Patient seen by neurology during the hospitalization who reviewed films and felt no findings in the CNS to explain patient's left lower extremity symptoms.  CK level which was obtained was within normal limits.  Per neurology no further inpatient work-up needed at this time and to follow-up in the outpatient setting with neurology.  Patient noted to have an appointment already made to be seen by Dr. Willaim Bane.Per neurology may consider EMG/NCS left lower extremity as outpatient for further evaluation.   ?-Patient be discharged in stable and improved condition with outpatient follow-up with neurology.   ?-Patient seen by physical therapy who recommended outpatient PT which has already been established.   ?-Patient will be discharged in stable and improved condition.  ?

## 2022-02-03 NOTE — ED Notes (Signed)
Report given to charge RN er to er transfer, Report given to [facility].  ?

## 2022-02-03 NOTE — Consult Note (Signed)
Opened in error

## 2022-02-03 NOTE — ED Notes (Signed)
ED Provider at bedside. 

## 2022-02-03 NOTE — H&P (Signed)
?History and Physical  ? ? ?Whitney MountMelissa Ann Trujillo ZOX:[id number] DOB: 06-Jun-1976 DOA: 02/03/2022 ? ?DOS: the patient was seen and examined on 02/03/2022 ? ?PCP: Bradd CanaryBlyth, Stacey A, MD  ? ?Patient coming from: Home ? ?I have personally briefly reviewed patient[facility] ? ?CC: left sided weakness ?HPI: ?46 yo WF with hx of ADD, carpel tunnel syndrome, psoriasis with 3-4 month hx of pain her her cervical spine. Radiation of pain to her left arm.  3-4 month hx of left foot drop. Has been seeing PT for about 6 weeks. Worsening numbness on her left side. ? ?Brought to ER today after going her PT session.  During PT, she reported new onset left sided numbness and weakness in left leg.  Pt directed to ED for evaluation. ? ?CT head negative for acute progress. Neurology consulted and [facility]. ? ? ?Additional History: ? ?In March 2023, pt injured the right side of her neck after a near fall from left foot drop. She had noticed she was dragging her left toe when she walked.  This had been going on since jan 2023.  Saw [facility]. They thought she might have an impinged nerve and started her on oral steroids and soft collar. ? ?She was seen again in April by ortho for continued pain in her neck.  MRI of c-spine was performed.  MRI of the cervical spine demonstrates disc bulging osteophyte complex C5-6 paracentral to the left with moderately severe left facet arthrosis. Severe facet arthrosis on the right C7 and T1 with moderately severe foraminal narrowing displacing the exiting right C8 nerve root. Moderately severe bilateral foraminal narrowing at C3-4. Since her previous MRI perhaps some mild progression in the foraminal stenosis at C5-6 on the left  ? ?Impression:  ? ?1. Acute cervical strain hyperextension with radiculopathy C8 nerve root distribution. Foraminal stenosis on the right. No evidence of fracture or instability  ?2. Bilateral cubital tunnel syndrome  ?3.  Foraminal stenosis C3-4 disc osteophyte complex C5-6  ?4. Facial dysesthesias predating the injury  ? ?During that appointment, PT was ordered for the patient. ? ?She was seen again Feb 01, 2022 and an EMG was performed. ? ?Electrodiagnostic evidence of chronic left cervical radiculopathy, affecting the left C8 nerve root. No evidence of active denervation or axonal injury in the muscles supplied by the respective nerve root.  ? ? 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 No active denervation or axonal injury noted in the left FDI muscle.  ? ? ?No electrodiagnostic evidence of a median motor or sensory entrapment neuropathy at the level of the left wrist.  ? ? ?No electrodiagnostic evidence of a peripheral neuropathy in the left upper extremity.  ? ? ?No electrodiagnostic evidence of a myopathy in the left upper extremity.  ? ? ?Electrodiagnostic evidence of a mild ulnar motor entrapment neuropathy at the level of the right elbow, consistent with cubital tunnel syndrome. No active denervation or axonal injury noted in the right FDI muscle.  ? ? ?No electrodiagnostic evidence of a median motor or sensory entrapment neuropathy at the level of the right wrist.  ? ? ?No electrodiagnostic evidence of a peripheral neuropathy in the right upper extremity.  ? ? ?  ? ?ED Course: CT head negative. [facility] ? ?Review of Systems:  ?Review of Systems  ?Constitutional:  Negative for chills,  fever and weight loss.  ?HENT:  Positive for tinnitus.   ?     Left ear tinnitus starting since jan 2023.  ?Eyes: Negative.   ?Respiratory: Negative.    ?Cardiovascular: Negative.   ?Gastrointestinal: Negative.   ?Genitourinary: Negative.   ?Musculoskeletal:  Positive for back pain, falls and neck pain.  ?Neurological:  Positive for tingling, focal weakness and weakness.  ?Endo/Heme/Allergies: Negative.    ?Psychiatric/Behavioral: Negative.    ?All other systems reviewed and are negative. ? ?Past Medical History:  ?Diagnosis Date  ? Acute pulmonary embolism  5 wks post hysterectomy 07/07/2014  ? Acute right lower quadrant pain 01/25/2016  ? ADD (attention deficit disorder) 02/02/2015  ? Allergy   ? seasonal allergies  ? Anxiety   ? Colon cancer screening 12/16/2015  ? Depression with anxiety 03/15/2016  ? Endometriosis   ? Flank pain 05/03/2021  ? Gallstones   ? Gross hematuria 05/03/2021  ? History of COVID-19 02/24/2020  ? Hyperlipidemia, mixed 02/02/2015  ? Migraine 12/16/2015  ? Preventative health care 12/16/2015  ? Pulmonary emboli (HCC)   ? b/l PE after hysterectomy, 5 weeks post op, clotting problem ruled out at [facility]  ? ? ?Past Surgical History:  ?Procedure Laterality Date  ? ABDOMINAL HYSTERECTOMY    ? ovaries removed, in 2 separate procedures  ? CESAREAN SECTION  2007, 2009  ? CHOLECYSTECTOMY    ? CYSTOSCOPY  10/2014  ? EYE SURGERY    ? lasik b/l  ? LAPAROTOMY    ? Endometriosis  ? WISDOM TOOTH EXTRACTION    ? ? ? reports that she has never smoked. She has never used smokeless tobacco. She reports current alcohol use. She reports that she does not use drugs. ? ?Allergies  ?Allergen Reactions  ? Latex Itching  ? Gluten Meal Other (See Comments)  ?  Cramping and diarrhea  ? ? ?Family History  ?Problem Relation Age of Onset  ? Cancer Father   ?     Carcinoid Syndrome, colon  ? Colon cancer Father   ? Headache Father   ? Diabetes Mother   ?     2, diet controlled  ? ADD / ADHD Son   ? Heart disease Maternal Grandmother   ? Stroke Paternal Grandmother   ? Dementia Paternal Grandmother   ? COPD Paternal Grandfather   ?     black lun, Forensic scientist, tobacco  ? Ovarian cancer Maternal Aunt   ? ? ?Prior to Admission medications   ?Medication Sig Start Date End Date Taking? Authorizing Provider  ?cyclobenzaprine (FLEXERIL) 5 MG tablet Take 5 mg by mouth 3 (three) times daily as needed for muscle spasms. 12/21/21  Yes [provider]  ?Dupilumab (DUPIXENT) 300 MG/2ML SOPN Inject 300 mg into the skin every 14 (fourteen) days. 05/09/21  Yes Bradd Canary, MD  ?FLUoxetine (PROZAC) 20 MG capsule Take 1 capsule (20 mg total) by mouth daily. ?Patient taking differently: Take 20 mg by mouth at bedtime. 12/12/21  Yes Clayborne Dana, NP  ?gabapentin (NEURONTIN) 300 MG capsule Take 300 mg by mouth daily as needed (pain). 01/19/22  Yes [provider]  ?meloxicam (MOBIC) 15 MG tablet Take 1 tablet (15 mg total) by mouth daily. ?Patient taking differently: Take 15 mg by mouth at bedtime. 12/12/21  Yes Clayborne Dana, NP  ?methocarbamol (ROBAXIN) 500 MG tablet Take 500 mg by mouth 2 (two) times daily as needed for muscle spasms. 12/27/21  Yes [provider]  ?  OVER THE COUNTER MEDICATION Take 4 capsules by mouth daily. Nutrafol-   Yes [provider]  ?Triamcinolone Acetonide (NASACORT AQ NA) Place 1 spray into both nostrils daily as needed (allergies).   Yes [provider]  ?triamcinolone ointment (KENALOG) 0.1 % Apply 1 application. topically 2 (two) times daily as needed (rash). 01/10/22  Yes [provider]  ? ? ?Physical Exam: ?Vitals:  ? 02/03/22 2200 02/03/22 2230 02/03/22 2300 02/03/22 2330  ?BP: 121/72 122/71 118/78 116/79  ?Pulse: 78 91 82 76  ?Resp:      ?Temp:      ?TempSrc:      ?SpO2: 95% 100% 100% 98%  ?Weight:      ?Height:      ? ? ?Physical Exam ?Vitals and nursing note reviewed.  ?Constitutional:   ?   General: She is not in acute distress. ?   Appearance: Normal appearance. She is normal weight. She is not ill-appearing, toxic-appearing or diaphoretic.  ?HENT:  ?   Head: Normocephalic and atraumatic.  ?   Nose: Nose normal. No rhinorrhea.  ?Eyes:  ?   General: No scleral icterus. ?   Comments: Right pupil 2.5 mm ?Left pupil 2 mm ?Both pupils were reactive to light  ?Cardiovascular:  ?   Rate and Rhythm: Normal rate and regular rhythm.  ?   Pulses: Normal pulses.  ?   Heart sounds:  Normal heart sounds.  ?Pulmonary:  ?   Effort: Pulmonary effort is normal. No respiratory distress.  ?   Breath sounds: Normal breath sounds. No wheezing or rales.  ?Abdominal:  ?   General: Abdomen is flat. Bowel sounds are normal.

## 2022-02-03 NOTE — ED Notes (Signed)
[facility] Transport Team at bedside 

## 2022-02-03 NOTE — Assessment & Plan Note (Addendum)
Has been using Dupixent since last year. No adverse side effects from it that she has noticed. ?-Outpatient follow-up with PCP. ?

## 2022-02-03 NOTE — ED Triage Notes (Signed)
Pt arrives pov, to triage in wheelchair, c/o numbness and tingling bilaterally, left side worse. Being treated with PT for 6 weeks. Has had numbness but worsened overnite. Last cervical MRI march 2023. Endorses HA and neck pain. EDP Schlossman notified. Left arm drift noted ?

## 2022-02-03 NOTE — Consult Note (Addendum)
NEUROLOGY CONSULTATION NOTE  ? ?Date of service: Feb 03, 2022 ?Patient Name: Whitney Trujillo ?MRN:  [id number] ?DOB:  1976-04-10 ?Reason for consult: LLE weakness ?Requesting physician: Dr. Mancel BaleElliott Wentz ?_ _ _   _ __   _ __ _ _  __ __   _ __   __ _ ? ?History of Present Illness  ? ?This is a 46 year old woman with a history of anxiety, depression, endometriosis, gallstones, status post hiatal hernia repair, status post hysterectomy, migraines who was transferred [facility] after physical therapist recommended she be urgently seen for rapidly worsening weakening of her left leg.  Patient reports that she has been previously diagnosed with arthritis and pinched nerves in her neck by her physician at [facility].  He recommended physical therapy but her symptoms have worsened since that time. ? ?Patient reports that she has had pain in her cervical spine in the midline and also radiating to all fingers of her left hand for approximately 3 years and that is progressively worsening.  She has occasional electric shooting pain up and down her spine with flexion of her neck (Lhemeritte's sign).  She has noticed very mild weakness in her left arm with extension of the elbow and sometimes grasping things.  She reports that approximately 3 to 4 months ago she began "scuffing" her left foot when she walks.  She has been tripping since then and has not had at least 1 significant fall due to this.  Approximately 2 days ago her left lower extremity weakness became acutely worse and her physical therapist sent her to [facility].  She reports numbness and tingling on the left side of her body face arm and leg.  She has occasional pain with eye movement bilaterally especially when she looks up or to the left.  Reports urinary frequency and occasional retention.  She reports frequent episodes of vertigo when she angles her neck down. ? ?She has previously had an MRI  C-spine without contrast that was performed at an outside clinic on 12/22/2021.  The images are not available for my review but the report described multilevel neuroforaminal stenosis without canal stenosis. ?  ?ROS  ? ?Per HPI: all other systems reviewed and are negative ? ?Past History  ? ?I have reviewed the following: ? ?Past Medical History:  ?Diagnosis Date  ? Acute right lower quadrant pain 01/25/2016  ? ADD (attention deficit disorder) 02/02/2015  ? Allergy   ? seasonal allergies  ? Anxiety   ? Depression with anxiety 03/15/2016  ? Endometriosis   ? Gallstones   ? Hyperlipidemia, mixed 02/02/2015  ? Migraine 12/16/2015  ? Preventative health care 12/16/2015  ? Pulmonary emboli (HCC)   ? b/l PE after hysterectomy, 5 weeks post op, clotting [facility]  ? ?Past Surgical History:  ?Procedure Laterality Date  ? ABDOMINAL HYSTERECTOMY    ? ovaries removed, in 2 separate procedures  ? CESAREAN SECTION  2007, 2009  ? CHOLECYSTECTOMY    ? CYSTOSCOPY  10/2014  ? EYE SURGERY    ? lasik b/l  ? LAPAROTOMY    ? Endometriosis  ? WISDOM TOOTH EXTRACTION    ? ?Family History  ?Problem Relation Age of Onset  ? Cancer Father   ?     Carcinoid Syndrome, colon  ? Colon cancer Father   ? Headache Father   ? Diabetes Mother   ?  2, diet controlled  ? ADD / ADHD Son   ? Heart disease Maternal Grandmother   ? Stroke Paternal Grandmother   ? Dementia Paternal Grandmother   ? COPD Paternal Grandfather   ?     black lun, Forensic scientist, tobacco  ? Ovarian cancer Maternal Aunt   ? ?Social History  ? ?Socioeconomic History  ? Marital status: Married  ?  Spouse name: Not on file  ? Number of children: Not on file  ? Years of education: Not on file  ? Highest education level: Not on file  ?Occupational History  ? Not on file  ?Tobacco Use  ? Smoking status: Never  ? Smokeless tobacco: Never  ?Vaping Use  ? Vaping Use: Never used  ?Substance and Sexual Activity  ? Alcohol use: Yes  ?  Comment: Socially  ? Drug use: No  ? Sexual  activity: Yes  ?  Partners: Male  ?  Birth control/protection: Surgical  ?  Comment: lives with husband (ADD) and children, 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  ?Other Topics Concern  ? Not on file  ?Social History Narrative  ? Lives at home with husband and 2 children  ? Right handed  ? Caffeine: 2-3 cups/day  ? ?Social Determinants of Health  ? ?Financial Resource Strain: Not on file  ?Food Insecurity: Not on file  ?Transportation Needs: Not on file  ?Physical Activity: Not on file  ?Stress: Not on file  ?Social Connections: Not on file  ? ?Allergies  ?Allergen Reactions  ? Latex Itching  ? Gluten Meal Other (See Comments)  ?  Cramping and diarrhea  ? ? ?Medications  ? ?(Not in a hospital admission) ?  ? ? ?Current Facility-Administered Medications:  ?  LORazepam (ATIVAN) injection 0.5 mg, 0.5 mg, Intravenous, Once, Mancel Bale, MD ? ?Current Outpatient Medications:  ?  Dupilumab (DUPIXENT) 300 MG/2ML SOPN, Inject 300 mg into the skin every 14 (fourteen) days., Disp: 3.92 mL, Rfl:  ?  FLUoxetine (PROZAC) 20 MG capsule, Take 1 capsule (20 mg total) by mouth daily., Disp: 90 capsule, Rfl: 1 ?  meloxicam (MOBIC) 15 MG tablet, Take 1 tablet (15 mg total) by mouth daily., Disp: 30 tablet, Rfl: 3 ? ?Vitals  ? ?Vitals:  ? 02/03/22 1358 02/03/22 1509 02/03/22 1800 02/03/22 1830  ?BP: (!) 133/109 133/81 140/81 137/84  ?Pulse: 88 76 69 89  ?Resp: 16 18 17    ?Temp: 98 ?F (36.7 ?C) 98 ?F (36.7 ?C)    ?TempSrc: Oral Oral    ?SpO2: 100% 100% 100% 100%  ?Weight:      ?Height:      ?  ? ?Body mass index is 26.26 kg/m?. ? ?Physical Exam  ? ?Physical Exam ?Gen: A&O x4, NAD ?HEENT: Atraumatic, normocephalic;mucous membranes moist; oropharynx clear, tongue without atrophy or fasciculations. ?Neck: Supple, trachea midline. ?Resp: CTAB, no w/r/r ?CV: RRR, no m/g/r; nml S1 and S2. 2+ symmetric peripheral pulses. ?Abd: soft/NT/ND; nabs x 4 quad ?Extrem: Nml bulk; no cyanosis, clubbing, or edema. ? ?Neuro: ?*MS: A&O x4.  Follows multi-step commands.  ?*Speech: fluid, nondysarthric, able to name and repeat ?*CN:  ?  I: Deferred ?  II,III: R APD, VFF by confrontation, optic discs unable to be visualized 2/2 pupillary constriction ?  III,IV,VI: EOMI w/o nystagmus, no ptosis ?  V: impaired to LT L V1-V3 ?  VII: Eyelid closure was full.  Smile symmetric. ?  VIII: Hearing intact to voice ?  IX,X: Voice normal, palate elevates  symmetrically  ?  XI: SCM/trap 5/5 bilat   ?XII: Tongue protrudes midline, no atrophy or fasciculations  ?*Motor:   Normal bulk.  No tremor, rigidity or bradykinesia. No pronator drift. ? ?  Strength: Dlt Bic Tri WrE WrF FgS Gr HF KnF KnE PlF DoF  ?  Left 4+ 5 4- 4+ 4+ 4+ 4+ 4 4 4+ 4+ 4  ?  Right 5 5 5 5 5 5 5 5 5 5 5 5   ? ? ?*Sensory: Impaired to LT on L side throughout, UTA sensory level 2/2 midline scar 2/2 hernia repair ?*Coordination:  FNF intact bilat ?*Reflexes:  3+ and symmetric throughout without clonus; toes mute bilat ?*Gait: deferred ? ?Labs  ? ?CBC:  ?Recent Labs  ?Lab 02/03/22 ?1115  ?WBC 6.9  ?HGB 14.9  ?HCT 42.8  ?MCV 86.5  ?PLT 265  ? ? ?Basic Metabolic Panel:  ?Lab Results  ?Component Value Date  ? NA 138 02/03/2022  ? K 3.8 02/03/2022  ? CO2 26 02/03/2022  ? GLUCOSE 101 (H) 02/03/2022  ? BUN 20 02/03/2022  ? CREATININE 0.79 02/03/2022  ? CALCIUM 9.7 02/03/2022  ? GFRNONAA >60 02/03/2022  ? GFRAA >60 03/07/2020  ? ?Lipid Panel:  ?Lab Results  ?Component Value Date  ? LDLCALC 140 (H) 11/07/2021  ? ?HgbA1c:  ?Lab Results  ?Component Value Date  ? HGBA1C 5.5 11/07/2021  ? ?Urine Drug Screen:  ?   ?Component Value Date/Time  ? LABOPIA NONE DETECTED 02/03/2022 1117  ? COCAINSCRNUR NONE DETECTED 02/03/2022 1117  ? LABBENZ NONE DETECTED 02/03/2022 1117  ? AMPHETMU NONE DETECTED 02/03/2022 1117  ? THCU NONE DETECTED 02/03/2022 1117  ? LABBARB NONE DETECTED 02/03/2022 1117  ?  ?Alcohol Level No results found for: ETH ? ? ?Impression  ? 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  On examination she has a right APD, numbness to her left side and face arm and leg, weakness in her left lower extremity

## 2022-02-03 NOTE — Subjective & Objective (Addendum)
CC: left sided weakness ?HPI: ?46 yo WF with hx of ADD, carpel tunnel syndrome, psoriasis with 3-4 month hx of pain her her cervical spine. Radiation of pain to her left arm.  3-4 month hx of left foot drop. Has been seeing PT for about 6 weeks. Worsening numbness on her left side. ? ?Brought to ER today after going her PT session.  During PT, she reported new onset left sided numbness and weakness in left leg.  Pt directed to ED for evaluation. ? ?CT head negative for acute progress. Neurology consulted and pt transferred to [facility] for MRI brain. ? ? ?Additional History: ? ?In March 2023, pt injured the right side of her neck after a near fall from left foot drop. She had noticed she was dragging her left toe when she walked.  This had been going on since jan 2023.  Saw [facility]. They thought she might have an impinged nerve and started her on oral steroids and soft collar. ? ?She was seen again in April by ortho for continued pain in her neck.  MRI of c-spine was performed.  MRI of the cervical spine demonstrates disc bulging osteophyte complex C5-6 paracentral to the left with moderately severe left facet arthrosis. Severe facet arthrosis on the right C7 and T1 with moderately severe foraminal narrowing displacing the exiting right C8 nerve root. Moderately severe bilateral foraminal narrowing at C3-4. Since her previous MRI perhaps some mild progression in the foraminal stenosis at C5-6 on the left  ? ?Impression:  ? ?1. Acute cervical strain hyperextension with radiculopathy C8 nerve root distribution. Foraminal stenosis on the right. No evidence of fracture or instability  ?2. Bilateral cubital tunnel syndrome  ?3. Foraminal stenosis C3-4 disc osteophyte complex C5-6  ?4. Facial dysesthesias predating the injury  ? ?During that appointment, PT was ordered for the patient. ? ?She was seen again Feb 01, 2022 and an EMG was performed. ? ?Electrodiagnostic evidence of chronic left cervical radiculopathy, affecting  the left C8 nerve root. No evidence of active denervation or axonal injury in the muscles supplied by the respective nerve root.  ? ? 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 No active denervation or axonal injury noted in the left FDI muscle.  ? ? ?No electrodiagnostic evidence of a median motor or sensory entrapment neuropathy at the level of the left wrist.  ? ? ?No electrodiagnostic evidence of a peripheral neuropathy in the left upper extremity.  ? ? ?No electrodiagnostic evidence of a myopathy in the left upper extremity.  ? ? ?Electrodiagnostic evidence of a mild ulnar motor entrapment neuropathy at the level of the right elbow, consistent with cubital tunnel syndrome. No active denervation or axonal injury noted in the right FDI muscle.  ? ? ?No electrodiagnostic evidence of a median motor or sensory entrapment neuropathy at the level of the right wrist.  ? ? ?No electrodiagnostic evidence of a peripheral neuropathy in the right upper extremity.  ? ? ? ?

## 2022-02-03 NOTE — ED Provider Notes (Signed)
ED to ED transfer from [facility] for MRI. ?Briefly this is a 46 year old female with progressive left-sided weakness, numbness and tingling.  She also intermittently has visual changes and headache.  She was evaluated at [facility] and had CT which was negative.  It was felt that she needed further work-up with MRI of the head, CT and L-spine to evaluate for neurodegenerative disorder. ?I have also evaluated the patient here.  She has decreased strength in the left arm and leg.  Cranial nerves are intact.  And she is protecting her airway.  MRIs have already been ordered.  I have consulted neurology. ?1725: Spoke with Yetta Barre, NP with neuro who will evaluate the patient.  ?1847: care of patient being handed of to Dr. Effie Shy, ED attending. Patient is pending MRIs and re-engaging with neurology regarding results and disposition.  ? ?  ?Cristopher Peru, PA-C ?02/03/22 1847 ? ?  ?Mancel Bale, MD ?02/03/22 1905 ? ?

## 2022-02-04 DIAGNOSIS — R202 Paresthesia of skin: Secondary | ICD-10-CM

## 2022-02-04 DIAGNOSIS — L309 Dermatitis, unspecified: Secondary | ICD-10-CM | POA: Diagnosis not present

## 2022-02-04 DIAGNOSIS — M21372 Foot drop, left foot: Secondary | ICD-10-CM | POA: Diagnosis not present

## 2022-02-04 DIAGNOSIS — R531 Weakness: Secondary | ICD-10-CM | POA: Diagnosis not present

## 2022-02-04 LAB — HIV ANTIBODY (ROUTINE TESTING W REFLEX): HIV Screen 4th Generation wRfx: NONREACTIVE

## 2022-02-04 LAB — RPR: RPR Ser Ql: NONREACTIVE

## 2022-02-04 LAB — CK: Total CK: 65 U/L (ref 38–234)

## 2022-02-04 MED ORDER — TRIAMCINOLONE ACETONIDE 0.1 % EX OINT
1.0000 "application " | TOPICAL_OINTMENT | Freq: Two times a day (BID) | CUTANEOUS | Status: DC | PRN
  Filled 2022-02-04: qty 15

## 2022-02-04 MED ORDER — FLUTICASONE PROPIONATE 50 MCG/ACT NA SUSP
1.0000 | Freq: Every day | NASAL | Status: DC | PRN
Start: 2022-02-04 — End: 2022-02-04
  Filled 2022-02-04: qty 16

## 2022-02-04 MED ORDER — GABAPENTIN 100 MG PO CAPS: 100.0000 mg | ORAL_CAPSULE | Freq: Three times a day (TID) | ORAL | 0 refills | Status: AC | PRN

## 2022-02-04 MED ORDER — GABAPENTIN 100 MG PO CAPS: 100.0000 mg | ORAL_CAPSULE | Freq: Three times a day (TID) | ORAL | Status: DC | PRN

## 2022-02-04 MED ORDER — MELOXICAM 7.5 MG PO TABS
15.0000 mg | ORAL_TABLET | Freq: Every day | ORAL | Status: DC
  Filled 2022-02-04: qty 2

## 2022-02-04 MED ORDER — CYCLOBENZAPRINE HCL 10 MG PO TABS: 5.0000 mg | ORAL_TABLET | Freq: Three times a day (TID) | ORAL | Status: DC | PRN

## 2022-02-04 MED ORDER — FLUOXETINE HCL 20 MG PO CAPS: 20.0000 mg | ORAL_CAPSULE | Freq: Every day | ORAL | Status: DC

## 2022-02-04 NOTE — Evaluation (Signed)
Physical Therapy Evaluation ?Patient Details ?Name: Whitney Trujillo [facility] ?MRN: [id number] ?DOB: 1976/03/20 ?Today's Date: 02/04/2022 ? ?History of Present Illness ? The pt is a 46 yo female presenting 5/12 with acute worsening of L-sided pain, weakness, and tingling. Similar sx began in LUE ~2 years ago, have continued to progress in severity and to LLE resulting in foot drop in January 2023. Other sx over past 2 months include increasing forgetfulness, vision changes, headaches, and dysphagia. MRI clear of acute abnormalities or vascular injury, undergoing continued work-up for cause of sx progression. PMH includes: DDD (bilateral foraminal narrowing C3-4, bulging disc at C5-6), anxiety, depression, endometriosis, s/p hiatial heria repair, and migraines. ?  ?Clinical Impression ? Pt in bed upon arrival of PT, agreeable to evaluation at this time. 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 The pt reports tripping due to L foot drop and L knee buckling once every 1-2 weeks but is usually able to self-correct and has only had one fall since onset of deficits in January. The pt now presents with limitations in functional mobility, strength, sensation, stability, and activity tolerance due to worsening of sx, and will continue to benefit from skilled PT to address these deficits. The pt was able to complete bed mobility without assist, but requires increased time and effort to complete movement of LLE. She also completed sit-stand transfers with minG for safety, but is compensating for LLE weakness by wt shifting to R to complete stand with minimal engagement of LLE. When directly cued to use LLE, the pt required increased time and effort to rise, and minA to steady. The pt did complete ~100 ft hallway ambulation with minG, had x2 instances of L knee buckling but  did not need assist to steady. Given significant progression of deficits, will continue to benefit from skilled PT acutely to progress functional strength and independence with transfers, but will also benefit from OP neuro PT after d/c.  ?   ?   ? ?Recommendations for follow up therapy are one component of a multi-disciplinary discharge planning process, led by the attending physician.  Recommendations may be updated based on patient status, additional functional criteria and insurance authorization. ? ?Follow Up Recommendations Outpatient PT (OP neuro PT) ? ?  ?Assistance Recommended at Discharge Frequent or constant Supervision/Assistance (for short time, then intermittent)  ?Patient can return home with the following ? A little help with walking and/or transfers;A little help with bathing/dressing/bathroom;Assistance with cooking/housework;Assist for transportation;Help with stairs or ramp for entrance ? ?  ?Equipment Recommendations  (shower chair, will trial cane)  ?Recommendations for Other Services ?    ?  ?Functional Status Assessment Patient has had a recent decline in their functional status and demonstrates the ability to make significant improvements in function in a reasonable and predictable amount of time.  ? ?  ?Precautions / Restrictions Precautions ?Precautions: Fall ?Precaution Comments: L numbness and weakness, hx of falls due to L knee buckling ?Restrictions ?Weight Bearing Restrictions: No  ? ?  ? ?Mobility ? Bed Mobility ?Overal bed mobility: Modified Independent ?  ?  ?  ?  ?  ?  ?General bed mobility comments: no assist given, increased effort to return BLE to bed ?  ? ?Transfers ?Overall transfer level: Needs assistance ?Equipment used: None ?Transfers: Sit to/from Stand ?Sit to Stand: Min guard ?  ?  ?  ?  ?  ?  General transfer comment: no assist given. pt placing LLE in front of R to decrease use, increased time and affort to stand with use of LLE, pt completed x4 through session with  increased use of LLE for strengthening. ?  ? ?Ambulation/Gait ?Ambulation/Gait assistance: Min guard ?Gait Distance (Feet): 100 Feet ?Assistive device: None ?Gait Pattern/deviations: Step-to pattern, Step-through pattern, Decreased stance time - left, Decreased weight shift to left, Knees buckling ?Gait velocity: decreased ?Gait velocity interpretation: <1.8 ft/sec, indicate of risk for recurrent falls ?  ?General Gait Details: pt with narrow BOS and minimal wt shift to L or stance time on LLE due to pt "not trusting" it. x2 instances of L knee buckling which pt corrected without need for increased assist. minA needed when pt asked to do walking marches, dependent on UE support. reports increased dizziness and numbness with horizontal and vertical head turns. ? ?  ? ?Balance Overall balance assessment: Needs assistance, History of Falls ?Sitting-balance support: No upper extremity supported, Feet supported ?Sitting balance-Leahy Scale: Good ?  ?  ?Standing balance support: No upper extremity supported, During functional activity ?Standing balance-Leahy Scale: Fair ?Standing balance comment: unable to tolerate challenge without needing assist. ?  ?  ?  ?  ?  ?  ?High level balance activites: Head turns, Other (comment) (walking marches, prolonged SLS) ?High Level Balance Comments: minA needed when pt asked to do walking marches, dependent on UE support. reports increased dizziness and numbness with horizontal and vertical head turns. ?  ?  ?  ?   ? ? ? ?Pertinent Vitals/Pain Pain Assessment ?Pain Assessment: 0-10 ?Pain Score: 3  ?Pain Location: low back (neck "always" hurts) ?Pain Descriptors / Indicators: Aching, Burning, Tingling ?Pain Intervention(s): Limited activity within patient's tolerance, Monitored during session, Repositioned  ? ? ?Home Living Family/patient expects to be discharged to:: Private residence ?Living Arrangements: Spouse/significant other;Children ?Available Help at Discharge: Family;Available  PRN/intermittently (husband works, Optician, dispensing in school (53 and 27 yo kids)) ?Type of Home: House ?Home Access: Stairs to enter ?Entrance Stairs-Rails: None ?Entrance Stairs-Number of Steps: 1-2 ?  ?Home Layout: One level ?Home Equipment: Crutches ?   ?  ?Prior Function Prior Level of Function : Working/employed;Driving;Independent/Modified Independent;History of Falls (last six months) ?  ?  ?  ?  ?  ?  ?Mobility Comments: independent without DME, trips once every 1-2 weeks, only once resulted in a fall. ?ADLs Comments: works as Emergency planning/management officer, causes her to be numb "all the time". poor grasp at work, limited ability to keep arms up to do hair. driving, independent ?  ? ? ?Hand Dominance  ? Dominant Hand: Right ? ?  ?Extremity/Trunk Assessment  ? Upper Extremity Assessment ?Upper Extremity Assessment: Defer to OT evaluation (pt reports more severe sx in LUE, numbness and tingling, dropping items at work in both hands) ?  ? ?Lower Extremity Assessment ?Lower Extremity Assessment: LLE deficits/detail ?LLE Deficits / Details: grossly 3+/5 to MMT with slightly inconsistent force production while testing. Pt with good AROM at ankle, but unable to maintain against pressure. reports numbness throughout but better than yesterday. ?LLE Sensation: decreased light touch ?  ? ?Cervical / Trunk Assessment ?Cervical / Trunk Assessment: Normal (guarding at cervical spine to avoid dizziness/sx)  ?Communication  ? Communication: No difficulties  ?Cognition Arousal/Alertness: Awake/alert ?Behavior During Therapy: [facility] for tasks assessed/performed ?Overall Cognitive Status: Within Functional Limits for tasks assessed ?  ?  ?  ?  ?  ?  ?  ?  ?  ?  ?  ?  ?  ?  ?  ?  ?  General Comments: pt pleasant and agreeable, able to follow all cues and give detailed hx ?  ?  ? ?  ?General Comments General comments (skin integrity, edema, etc.): VSS oN RA ? ?  ?Exercises Other Exercises ?Other Exercises: sit-stand from low chair without armrests. pt able to  complete with RLE advanced compared to LLE, but needed increased time, effort, and use of momentum to rise. pt reports this feels like exercise to LLE to stand. completed x4  ? ?Assessment/Plan  ?  ?PT Ass

## 2022-02-04 NOTE — Progress Notes (Addendum)
Neurology Progress Note ? ?S: No new neurologic complaints today. Slight improvement LLE weakness, tingling. Cleared by PT for home w/ outpatient PT. ? ?Interval data: ? ?MRI brain wwo: normal ? ?MRI c spine wwo ? ?1. Normal MRI appearance of the cervical spinal cord. ?2. Mild multilevel cervical spondylosis as above without significant ?spinal stenosis. ?3. Multifactorial degenerative changes with resultant multilevel ?foraminal narrowing as above. Notable findings include severe left ?C3 foraminal stenosis, moderate bilateral C4 foraminal narrowing, ?with mild left C5 and C6 foraminal stenosis. ? ?MRI t spine wwo ? ?1. Normal MRI appearance of the thoracic spinal cord. ?2. Small disc protrusions involving the T3-4 through T8-9 levels as ?detailed above. No significant spinal stenosis. ? ?MRI l spine wwo ? ?1. Small central disc protrusion with annular fissure at L5-S1, ?closely approximating the left greater than right descending S1 ?nerve roots without frank impingement. ?2. Otherwise unremarkable and normal MRI of the lumbar spine. ? ?CTA H&N: normal ? ?CNS imaging personally reviewed; I agree with above interpretations ? ?A1c, TSH 11/07/21 WNL ? ?CK, RPR pending ? ?O: ? ?Vitals:  ? 02/04/22 0814 02/04/22 1219  ?BP: 113/73 124/60  ?Pulse: 85 79  ?Resp: 18 16  ?Temp: 99 ?F (37.2 ?C)   ?SpO2: 99% 98%  ? ?Physical Exam ?Gen: A&O x4, NAD ?HEENT: Atraumatic, normocephalic;mucous membranes moist; oropharynx clear, tongue without atrophy or fasciculations. ?Neck: Supple, trachea midline. ?Resp: CTAB, no w/r/r ?CV: RRR, no m/g/r; nml S1 and S2. 2+ symmetric peripheral pulses. ?Abd: soft/NT/ND; nabs x 4 quad ?Extrem: Nml bulk; no cyanosis, clubbing, or edema. ?  ?Neuro: ?*MS: A&O x4. Follows multi-step commands.  ?*Speech: fluid, nondysarthric, able to name and repeat ?*CN:  ?  I: Deferred ?  II,III: ?R APD, VFF by confrontation, optic discs unable to be visualized 2/2 pupillary constriction ?  III,IV,VI: EOMI w/o  nystagmus, no ptosis ?  V: impaired to LT L V1-V3 ?  VII: Eyelid closure was full.  Smile symmetric. ?  VIII: Hearing intact to voice ?  IX,X: Voice normal, palate elevates symmetrically  ?  XI: SCM/trap 5/5 bilat   ?XII: Tongue protrudes midline, no atrophy or fasciculations  ?*Motor:   Normal bulk.  No tremor, rigidity or bradykinesia. No pronator drift. ?  ?  Strength: Dlt Bic Tri WrE WrF FgS Gr HF KnF KnE PlF DoF  ?  Left 4+ 5 4- 4+ 4+ 4+ 4+ 4 4 4+ 4+ 4  ?  Right 5 5 5 5 5 5 5 5 5 5 5 5   ?  ?Strength exam somewhat effort dependent in LLE ?  ?*Sensory: Impaired to LT on L side throughout, UTA sensory level 2/2 midline scar 2/2 hernia repair ?*Coordination:  FNF intact bilat ?*Reflexes:  3+ and symmetric throughout without clonus; toes mute bilat ?*Gait: deferred ? 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  On exam she has numbness to her left side and face arm and leg, weakness in her left lower extremity greater than left upper extremity, and diffuse hyperreflexia concerning for central nervous system etiology.  Extensive workup this admission including MRI brain, c, t, and l spine wwo contrast showed known bilateral cervical neuroforaminal stenosis but no findings in the CNS to explain her LLE sx. Patient was specifically concerned about MS, and I reassured her her imaging ruled this out as an etiology. I added on CK, which will  likely be normal, but we should wait for result before discharge. Otherwise no further inpatient workup indicated. PT has cleared her for discharge with outpatient PT (already established). She has outpatient neurology appointment to establish care with Dr. Lucia Gaskins at [facility]. She may consider EMG/NCS LLE as o/p (not available inpatient, would not yield reliable results within 3 wks of sx onset regardless). ? ?- F/u CK - if WNL can go home today ?- She has outpatient  neurology appointment to establish care with Dr. Lucia Gaskins at [facility]. May consider EMG/NCS LLE as o/p (not available inpatient, would not yield reliable results within 3 wks of sx onset regardless) ?- Outpatient PT/OT ? ?Neurology to sign off, but please re-engage if additional neurologic concerns arise. ? ?Bing Neighbors, MD ?[facility] ?[phone] ? ?If 7pm- 7am, please page neurology on call as listed in AMION. ? ?

## 2022-02-04 NOTE — ED Notes (Signed)
Pt ambulated to the restroom with a steady gait. Hooked back up to monitor, call bell in reach.  ?

## 2022-02-04 NOTE — ED Notes (Signed)
Pt sitting up eating breakfast with call light in place ?

## 2022-02-04 NOTE — Evaluation (Signed)
Occupational Therapy Evaluation ?Patient Details ?Name: Whitney Trujillo [facility] ?MRN: [id number] ?DOB: [birth year] ?Today's Date: 02/04/2022 ? ? ?History of Present Illness The pt is a 46 yo female presenting 5/12 with acute worsening of L-sided pain, weakness, and tingling. Similar sx began in LUE ~2 years ago, have continued to progress in severity and to LLE resulting in foot drop in January 2023. Other sx over past 2 months include increasing forgetfulness, vision changes, headaches, and dysphagia. MRI clear of acute abnormalities or vascular injury, undergoing continued work-up for cause of sx progression. PMH includes: DDD (bilateral foraminal narrowing C3-4, bulging disc at C5-6), anxiety, depression, endometriosis, s/p hiatial heria repair, and migraines.  ? ?Clinical Impression ?  ?Pt admitted for concerns listed above. PTA pt reported that she was independent with all ADL's and IADLs', including working as a Scientist, research (medical). At this time, pt presents with increased weakness, balance deficits, and decreased sensation (numbness and tingling) all over her body, especially in all extremities. She is requiring min guard to min A at times for balance. Expecting pt to improve to needing no OT services, acute OT will follow and monitor progress.   ?   ? ?Recommendations for follow up therapy are one component of a multi-disciplinary discharge planning process, led by the attending physician.  Recommendations may be updated based on patient status, additional functional criteria and insurance authorization.  ? ?Follow Up Recommendations ? No OT follow up  ?  ?Assistance Recommended at Discharge Set up Supervision/Assistance  ?Patient can return home with the following A little help with walking and/or transfers;Assistance with cooking/housework ? ?  ?Functional Status Assessment ? Patient has had a recent decline in their functional status and demonstrates the ability to make significant improvements in function in a  reasonable and predictable amount of time.  ?Equipment Recommendations ? Tub/shower seat  ?  ?Recommendations for Other Services   ? ? ?  ?Precautions / Restrictions Precautions ?Precautions: Fall ?Precaution Comments: L numbness and weakness, hx of falls due to L knee buckling ?Restrictions ?Weight Bearing Restrictions: No  ? ?  ? ?Mobility Bed Mobility ?Overal bed mobility: Modified Independent ?  ?  ?  ?  ?  ?  ?General bed mobility comments: no assist given, increased effort to return BLE to bed ?  ? ?Transfers ?Overall transfer level: Needs assistance ?Equipment used: None ?Transfers: Sit to/from Stand ?Sit to Stand: Min guard ?  ?  ?  ?  ?  ?General transfer comment: no assist given. pt placing LLE in front of R to decrease use, increased time and affort to stand with use of LLE, pt completed x4 through session with increased use of LLE for strengthening. ?  ? ?  ?Balance Overall balance assessment: Needs assistance, History of Falls ?Sitting-balance support: No upper extremity supported, Feet supported ?Sitting balance-Leahy Scale: Good ?  ?  ?Standing balance support: No upper extremity supported, During functional activity ?Standing balance-Leahy Scale: Fair ?Standing balance comment: unable to tolerate challenge without needing assist. ?  ?  ?  ?  ?  ?  ?  ?High Level Balance Comments: minA needed when pt asked to do walking marches, dependent on UE support. reports increased dizziness and numbness with horizontal and vertical head turns. ?  ?  ?  ?   ? ?ADL either performed or assessed with clinical judgement  ? ?ADL Overall ADL's : Needs assistance/impaired ?  ?  ?  ?  ?  ?  ?  ?  ?  ?  ?  ?  ?  ?  ?  ?  ?  ?  ?  ?  General ADL Comments: Min guard for all ADL's due to balance concerns, numbness, and weakness.  ? ? ? ?Vision Baseline Vision/History: 0 No visual deficits ?Ability to See in Adequate Light: 0 Adequate ?Patient Visual Report: Diplopia;Blurring of vision ?Vision Assessment?: Vision impaired- to  be further tested in functional context ?Additional Comments: Pt reports double vision looking to the right for past 2 months, dizziness (she calls it vertigo) when looking side to side and up and down, blurred vision intermittently.  ?   ?Perception   ?  ?Praxis   ?  ? ?Pertinent Vitals/Pain Pain Assessment ?Pain Assessment: 0-10 ?Pain Score: 3  ?Pain Location: low back (neck "always" hurts) ?Pain Descriptors / Indicators: Aching, Burning, Tingling ?Pain Intervention(s): Monitored during session, Limited activity within patient's tolerance, Repositioned  ? ? ? ?Hand Dominance Right ?  ?Extremity/Trunk Assessment Upper Extremity Assessment ?Upper Extremity Assessment: Generalized weakness (Numbness in BUE) ?  ?Lower Extremity Assessment ?Lower Extremity Assessment: Defer to PT evaluation ?LLE Deficits / Details: . ?LLE Sensation: decreased light touch ?  ?Cervical / Trunk Assessment ?Cervical / Trunk Assessment: Normal (guarding at cervical spine to avoid dizziness/sx) ?  ?Communication Communication ?Communication: No difficulties ?  ?Cognition Arousal/Alertness: Awake/alert ?Behavior [facility] for tasks assessed/performed ?Overall Cognitive Status: Within Functional Limits for tasks assessed ?  ?  ?  ?  ?  ?  ?  ?  ?  ?  ?  ?  ?  ?  ?  ?  ?General Comments: pt pleasant and agreeable, able to follow all cues and give detailed hx ?  ?  ?General Comments  VSS on RA ? ?  ?Exercises Exercises: Other exercises ?  ?Shoulder Instructions    ? ? ?Home Living Family/patient expects to be discharged to:: Private residence ?Living Arrangements: Spouse/significant other;Children ?Available Help at Discharge: Family;Available PRN/intermittently (husband works, Public affairs consultantkids in school (3014 and 48 yo kids)) ?Type of Home: House ?Home Access: Stairs to enter ?Entrance Stairs-Number of Steps: 1-2 ?Entrance Stairs-Rails: None ?Home Layout: One level ?  ?  ?Bathroom Shower/Tub: Walk-in shower ?  ?Bathroom Toilet: Handicapped height ?   ?  ?Home Equipment: Crutches ?  ?  ?  ? ?  ?Prior Functioning/Environment Prior Level of Function : Working/employed;Driving;Independent/Modified Independent;History of Falls (last six months) ?  ?  ?  ?  ?  ?  ?Mobility Comments: independent without DME, trips once every 1-2 weeks, only once resulted in a fall. 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 poor grasp at work, limited ability to keep arms up to do hair. driving, independent ?  ? ?  ?  ?OT Problem List: Decreased strength;Decreased activity tolerance;Impaired balance (sitting and/or standing);Decreased coordination;Impaired sensation ?  ?   ?OT Treatment/Interventions: Self-care/ADL training;Therapeutic exercise;Energy conservation;DME and/or AE instruction;Therapeutic activities;Patient/family education;Balance training  ?  ?OT Goals(Current goals can be found in the care plan section) Acute Rehab OT Goals ?Patient Stated Goal: To not have anymore falls ?OT Goal Formulation: With patient ?Time For Goal Achievement: 02/18/22 ?Potential to Achieve Goals: Good ?ADL Goals ?Pt Will Perform Lower Body Bathing: with modified independence;sitting/lateral leans;sit to/from stand ?Pt Will Perform Lower Body Dressing: with modified independence;sitting/lateral leans;sit to/from stand ?Pt Will Transfer to Toilet: with modified independence;ambulating ?Pt Will Perform Toileting - Clothing Manipulation and hygiene: with modified independence;sitting/lateral leans;sit to/from stand ?Additional ADL Goal #1: Pt will maintain dynamic balance during standing ADL tasks for 5 mins with no LoB.  ?OT Frequency: Min  2X/week ?  ? ?Co-evaluation   ?  ?  ?  ?  ? ?  ?AM-PAC OT "6 Clicks" Daily Activity     ?Outcome Measure Help from another person eating meals?: None ?Help from another person taking care of personal grooming?: A Little ?Help from another person toileting, which includes using toliet, bedpan, or urinal?: A Little ?Help from  another person bathing (including washing, rinsing, drying)?: A Little ?Help from another person to put on and taking off regular upper body clothing?: A Little ?Help from another person to put on and taking off

## 2022-02-04 NOTE — Discharge Summary (Signed)
Physician Discharge Summary  ?Whitney Trujillo HCW:[id number] DOB: 1976/05/17 DOA: 02/03/2022 ? ?PCP: Bradd Canary, MD ? ?Admit date: 02/03/2022 ?Discharge date: 02/04/2022 ? ?Time spent: 50 minutes ? ?Recommendations for Outpatient Follow-up:  ?Follow-up with Dr. Lucia Gaskins, [facility] as scheduled. ? ? ?Discharge Diagnoses:  ?Principal Problem: ?  Left-sided weakness ?Active Problems: ?  Eczema ? ? ?Discharge Condition: Stable and improved ? ?Diet recommendation: Regular ? ?Filed Weights  ? 02/03/22 1040  ?Weight: 59 kg  ? ? ?History of present illness:  ?HPI per Dr. Imogene Burn ?46 yo WF with hx of ADD, carpel tunnel syndrome, psoriasis with 3-4 month hx of pain her her cervical spine. Radiation of pain to her left arm.  3-4 month hx of left foot drop. Has been seeing PT for about 6 weeks. Worsening numbness on her left side. ?  ?Brought to ER today after going her PT session.  During PT, she reported new onset left sided numbness and weakness in left leg.  Pt directed to ED for evaluation. ?  ?CT head negative for acute progress. Neurology consulted and pt transferred to [facility] for MRI brain. ?  ?  ?Additional History: ?  ?In March 2023, pt injured the right side of her neck after a near fall from left foot drop. She had noticed she was dragging her left toe when she walked.  This had been going on since jan 2023.  Saw [facility]. They thought she might have an impinged nerve and started her on oral steroids and soft collar. ?  ?She was seen again in April by ortho for continued pain in her neck.  MRI of c-spine was performed.  MRI of the cervical spine demonstrates disc bulging osteophyte complex C5-6 paracentral to the left with moderately severe left facet arthrosis. Severe facet arthrosis on the right C7 and T1 with moderately severe foraminal narrowing displacing the exiting right C8 nerve root. Moderately severe bilateral foraminal narrowing at C3-4. Since her previous MRI perhaps some mild progression in the foraminal  stenosis at C5-6 on the left  ? ?Impression:  ? ?1. Acute cervical strain hyperextension with radiculopathy C8 nerve root distribution. Foraminal stenosis on the right. No evidence of fracture or instability  ?2. Bilateral cubital tunnel syndrome  ?3. Foraminal stenosis C3-4 disc osteophyte complex C5-6  ?4. Facial dysesthesias predating the injury  ?  ?During that appointment, PT was ordered for the patient. ?  ?She was seen again Feb 01, 2022 and an EMG was performed. ?  ?Electrodiagnostic evidence of chronic left cervical radiculopathy, affecting the left C8 nerve root. No evidence of active denervation or axonal injury in the muscles supplied by the respective nerve root.  ? ? 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 No active denervation or axonal injury noted in the left FDI muscle.  ? ? ?No electrodiagnostic evidence of a median motor or sensory entrapment neuropathy at the level of the left wrist.  ? ? ?No electrodiagnostic evidence of a peripheral neuropathy in the left upper extremity.  ? ? ?No electrodiagnostic evidence of a myopathy in the left upper extremity.  ? ? ?Electrodiagnostic evidence of a mild ulnar motor entrapment neuropathy at the level of the right elbow, consistent with cubital tunnel syndrome. No active denervation or axonal injury noted in the right FDI muscle.  ? ? ?No electrodiagnostic evidence of a median motor or sensory entrapment neuropathy at the level of the right  wrist.  ? ? ?No electrodiagnostic evidence of a peripheral neuropathy in the right upper extremity.  ?  ?  ?  ?  ?ED Course: CT head negative. Transferred to [facility] for MRI. ?  ?Hospital Course:  ? ?Assessment and Plan: ?* Left-sided weakness ?Patient admitted for further evaluation.. Pt's MRI head, Cervical, thoracic and lumbar spine do not explain pt consistent symptoms of weakness and neuropathy. Workup per neurology. Pt  without any MS lesions on her MRI.  Also interesting in that pt has high arched feet and her initial symptoms was a left foot drop.  Charcot-marie-tooth disease I think is also in the differential. Pt did have an EMG performed at ortho office but not sure if this included nerve conduction. Pt has normal and maybe slightly hyperreflexic DTR which would argue against CMT. ?-Patient placed on Neurontin 100 mg 3 times daily as needed as patient stated home regimen of Neurontin 300 mg made her feel sleepy/tired.  ?-HIV done was nonreactive.   ?-Patient seen by neurology during the hospitalization who reviewed films and felt no findings in the CNS to explain patient's left lower extremity symptoms.  CK level which was obtained was within normal limits.  Per neurology no further inpatient work-up needed at this time and to follow-up in the outpatient setting with neurology.  Patient noted to have an appointment already made to be seen by Dr. Willaim Bane.Per neurology may consider EMG/NCS left lower extremity as outpatient for further evaluation.   ?-Patient be discharged in stable and improved condition with outpatient follow-up with neurology.   ?-Patient seen by physical therapy who recommended outpatient PT which has already been established.   ?-Patient will be discharged in stable and improved condition.  ? ?Eczema ?Has been using Dupixent since last year. No adverse side effects from it that she has noticed. ?-Outpatient follow-up with PCP. ? ? ? ? ?  ? ?Procedures: ?CT head 02/03/2022 ?CT angiogram head and neck 02/03/2022 ?Chest x-ray 02/03/2022 ?MRI brain 02/03/2022 ?MRI C/L/T-spine 02/03/2022 ? ? ? ? ?Consultations: ?Neurology: Dr. Selina Cooley 02/03/2022 ? ?Discharge Exam: ?Vitals:  ? 02/04/22 0814 02/04/22 1219  ?BP: 113/73 124/60  ?Pulse: 85 79  ?Resp: 18 16  ?Temp: 99 ?F (37.2 ?C)   ?SpO2: 99% 98%  ? ? ?General: NAD ?Cardiovascular: Regular rate rhythm no murmurs rubs or gallops.  No JVD.  No lower extremity  edema. ?Respiratory: CTA B.  No wheezes, no crackles, no rhonchi. ? ?Discharge Instructions ? ? ?Discharge Instructions   ? ? Diet general   Complete by: As directed ?  ? Increase activity slowly   Complete by: As directed ?  ? ?  ? ?Allergies as of 02/04/2022   ? ?   Reactions  ? Latex Itching  ? Gluten Meal Other (See Comments)  ? Cramping and diarrhea  ? ?  ? ?  ?Medication List  ?  ? ?TAKE these medications   ? ?cyclobenzaprine 5 MG tablet ?Commonly known as: FLEXERIL ?Take 5 mg by mouth 3 (three) times daily as needed for muscle spasms. ?  ?Dupixent 300 MG/2ML Sopn ?Generic drug: Dupilumab ?Inject 300 mg into the skin every 14 (fourteen) days. ?  ?FLUoxetine 20 MG capsule ?Commonly known as: PROZAC ?Take 1 capsule (20 mg total) by mouth daily. ?What changed: when to take this ?  ?gabapentin 100 MG capsule ?Commonly known as: NEURONTIN ?Take 1 capsule (100 mg total) by mouth 3 (three) times daily as needed (pain). ?What changed:  ?medication strength ?  how much to take ?when to take this ?  ?meloxicam 15 MG tablet ?Commonly known as: MOBIC ?Take 1 tablet (15 mg total) by mouth daily. ?What changed: when to take this ?  ?methocarbamol 500 MG tablet ?Commonly known as: ROBAXIN ?Take 500 mg by mouth 2 (two) times daily as needed for muscle spasms. ?  ?NASACORT AQ NA ?Place 1 spray into both nostrils daily as needed (allergies). ?  ?OVER THE COUNTER MEDICATION ?Take 4 capsules by mouth daily. Nutrafol- ?  ?triamcinolone ointment 0.1 % ?Commonly known as: KENALOG ?Apply 1 application. topically 2 (two) times daily as needed (rash). ?  ? ?  ? ?Allergies  ?Allergen Reactions  ? Latex Itching  ? Gluten Meal Other (See Comments)  ?  Cramping and diarrhea  ? ? Follow-up Information   ? ? Anson FretAhern, Antonia B, MD Follow up.   ?Specialty: Neurology ?Why: Follow-up as scheduled. ?Contact information: ?[address] ?[address] ?[place] [address] ?[phone] ? ? ?  ?  ? ?  ?  ? ?  ? ? ? ?The results of significant diagnostics from  this hospitalization (including imaging, microbiology, ancillary and laboratory) are listed below for reference.   ? ?Significant Diagnostic Studies: ?CT ANGIO HEAD NECK W WO CM ? ?Result Date: 02/03/2022 ?CLINICAL DATA:

## 2022-02-04 NOTE — ED Notes (Signed)
Breakfast order placed ?

## 2022-02-05 ENCOUNTER — Encounter: Payer: Self-pay | Admitting: Physical Therapy

## 2022-02-07 ENCOUNTER — Ambulatory Visit: Payer: 59 | Admitting: Physical Therapy

## 2022-02-07 ENCOUNTER — Encounter: Payer: Self-pay | Admitting: Physical Therapy

## 2022-02-07 DIAGNOSIS — M6281 Muscle weakness (generalized): Secondary | ICD-10-CM

## 2022-02-07 DIAGNOSIS — R252 Cramp and spasm: Secondary | ICD-10-CM

## 2022-02-07 DIAGNOSIS — M542 Cervicalgia: Secondary | ICD-10-CM

## 2022-02-07 DIAGNOSIS — R293 Abnormal posture: Secondary | ICD-10-CM

## 2022-02-07 NOTE — Therapy (Signed)
?OUTPATIENT PHYSICAL THERAPY TREATMENT NOTE ? ? ?Patient Name: Celes Dedic ?MRN: [id number] ?DOB:[birth year], 46 y.o., female ?Today's Date: 02/07/2022 ? ?PCP: Mosie Lukes, MD ?REFERRING PROVIDER: Susa Day, MD ? ? PT End of Session - 02/07/22 1440   ? ? Visit Number 12   ? Number of Visits 12   ? Date for PT Re-Evaluation 02/09/22   ? Authorization Type [employer]   ? Progress Note Due on Visit 10   ? PT Start Time 1440   ? PT Stop Time 1530   ? PT Time Calculation (min) 50 min   ? Activity Tolerance Patient tolerated treatment well   ? Behavior During Therapy [facility] for tasks assessed/performed   ? ?  ?  ? ?  ? ? ?Past Medical History:  ?Diagnosis Date  ? Acute pulmonary embolism  5 wks post hysterectomy 07/07/2014  ? Acute right lower quadrant pain 01/25/2016  ? ADD (attention deficit disorder) 02/02/2015  ? Allergy   ? seasonal allergies  ? Anxiety   ? Colon cancer screening 12/16/2015  ? Depression with anxiety 03/15/2016  ? Endometriosis   ? Flank pain 05/03/2021  ? Gallstones   ? Gross hematuria 05/03/2021  ? History of COVID-19 02/24/2020  ? Hyperlipidemia, mixed 02/02/2015  ? Migraine 12/16/2015  ? Preventative health care 12/16/2015  ? Pulmonary emboli (HCC)   ? b/l PE after hysterectomy, 5 weeks post op, clotting problem ruled out at [facility]  ? ?Past Surgical History:  ?Procedure Laterality Date  ? ABDOMINAL HYSTERECTOMY    ? ovaries removed, in 2 separate procedures  ? CESAREAN SECTION  2007, 2009  ? CHOLECYSTECTOMY    ? CYSTOSCOPY  10/2014  ? EYE SURGERY    ? lasik b/l  ? LAPAROTOMY    ? Endometriosis  ? WISDOM TOOTH EXTRACTION    ? ?Patient Active Problem List  ? Diagnosis Date Noted  ? Left-sided weakness 02/03/2022  ? Eczema 12/17/2021  ? Psoriasis 11/09/2020  ? Arthritis 07/05/2020  ? Bilateral occipital neuralgia 05/04/2020  ? Exertional headache 05/04/2020  ? Migraine without aura and without status migrainosus, not intractable 05/04/2020  ? Bilateral carpal tunnel syndrome 10/19/2017  ? Hyperlipidemia,  mixed 02/02/2015  ? Vitamin D deficiency 02/02/2015  ? ADD (attention deficit disorder) 02/02/2015  ? Allergy   ? Endometriosis 07/07/2014  ? ? ?REFERRING DIAG: M54.2 (ICD-10-CM) - Cervical spine pain ? ?THERAPY DIAG:  ?Cervicalgia ? ?Muscle weakness (generalized) ? ?Abnormal posture ? ?Cramp and spasm ? ?PERTINENT HISTORY: see Media for latest MRI ? ?PRECAUTIONS: no ? ?SUBJECTIVE: Patient arrived today reporting improved symptoms, but still feeling intermittent and varying numbness and weakness.  ? ?PAIN:  ?Are you having pain? Yes: NPRS scale: 3/10 ?Pain location: neck  ? ?OBJECTIVE: (objective measures completed at initial evaluation unless otherwise dated) ?FOTO: neck: 34%, predicted outcome 60% after 12 visits.  12/29/2021 ?02/07/2022- 56%  ?   ?Sensation  ?Light Touch Appears Intact   ?Additional Comments tingling in 4th finger bil, reports R side feels "heavier" but sensation intact   ?   ?Posture/Postural Control  ?Posture/Postural Control Postural limitations   ?Postural Limitations Rounded Shoulders   ?Posture Comments decreased cervical lordosis   ?   ?ROM / Strength  ?AROM / PROM / Strength AROM;Strength   ?   ?AROM  ?Overall AROM  Deficits   ?Overall AROM Comments extremely slow guarded movements, tested in sitting.  Shoulder ROM WNL and symmetric bil.   ?AROM Assessment Site Cervical   ?  Cervical Flexion 50   very tight, gets zapping sensation.  ?Cervical Extension 40   ?Cervical - Right Rotation 45   ?Cervical - Left Rotation 45   ?   ?Strength  ?Overall Strength Deficits   ?Overall Strength Comments tested in sitting   ?Strength Assessment Site Shoulder;Elbow;Wrist;Hand   ?Right/Left Shoulder Right;Left   ?Right Shoulder Flexion 4+/5   pain  ?Right Shoulder ABduction 5/5   ?Right Shoulder Internal Rotation 4+/5   ?Right Shoulder External Rotation 5/5   ?Left Shoulder Flexion 5/5   ?Left Shoulder ABduction 5/5   ?Left Shoulder Internal Rotation 4+/5   ?Left Shoulder External Rotation 5/5   ?Right/Left  Elbow Right;Left   ?Right Elbow Flexion 4+/5   ?Right Elbow Extension 4/5   ?Left Elbow Flexion 5/5   ?Left Elbow Extension 5/5   ?Right/Left Wrist Right;Left   ?Right Wrist Flexion 5/5   ?Right Wrist Extension 5/5   ?Left Wrist Flexion 5/5   ?Left Wrist Extension 5/5   ?Right/Left hand Right;Left   ?Right Hand Gross Grasp Functional   ?Left Hand Gross Grasp Functional   ?   ?Palpation  ?Spinal mobility hypomobility throughout cervical spine   ?Palpation comment Tenderness/trigger points bil UT, levator scapulae, cervical paraspinals   ? ? ? ?TODAY'S TREATMENT:  ?02/07/22- Self Care - education on imaging, findings, progress, next steps.  ?Manual therapy - in supine to decrease muscle spasm and improve cervical ROM - STM to cervical paraspinals, PA mobs cervical spine grade 3-4, NAGs into rotation, manual cervical traction with 15 sec holds.  ? ?02/03/22- No interventions.  ? ?01/31/22 ?UBE x 6 min L2 37fw/3bw ?Nerve glides x 3 bil (prayer); also did one ulnar nerve hand upside down ?Server stretch x 10 5 sec hold with axial extension ?Reviewed thoracic extension over foam roll ?Supine neck rotation on small noodle x 3 ea ? ?Manual Therapy: CPA mobs lumbar, thoracic gd II/III; UPA L and R to cervical spine; STM to bil cervical paraspinals and UTs; OA release  and manual traction x 20 sec which increased tingling today. ? ? ?PATIENT EDUCATION: ?Education details: server stretch ?Person educated: Patient ?Education method: Explanation and Demonstration ?Education comprehension: verbalized understanding and returned demonstration ? ? ?HOME EXERCISE PROGRAM: ?[id number] ? ?ASSESSMENT: ?Clinical Impression: Kyleigh Nannini returns today after being taken to ED by this PT last Friday.  She was transported to [facility] to rule out CVA.  She had imaging of brain, neck and lumbar spine, CVA and MS were both ruled out but she continues to report feelings of numbness and weakness.  She reports she has been tripping over L foot,  but she is able to walk on both heels and toes.  She has not had NCV testing of LE done yet, and results for UE have not yet been released.  Her neck pain is improved  today, but she took a muscle relaxer and has not worked for 2 days.  She has made progress with PT, but based on these new symptoms, agreed to discharge today.  She is compliant and independent with HEP and feels it has helped her neck pain significantly.  Most of session was spent discussing her results and plan for future.  Manual therapy at end of session to further relax spasm and improve vertebral mobility, which was tolerated well.   ? ? PT Short Term Goals - 01/06/22 0905   ? ?  ? PT SHORT TERM GOAL #1  ? Title Ind with initial  HEP   ? Time 2   ? Period Weeks   ? Status Achieved   ? Target Date 01/12/22   ? ?  ?  ? ?  ? ? ? PT Long Term Goals - 01/02/22 1620   ? ?  ? PT LONG TERM GOAL #1  ? Title Ind with progressed HEP to improve posture and outcomes.   ? Time 6   ? Period Weeks   ? Status Met, good compliance   ? Target Date 02/09/22   ?  ? PT LONG TERM GOAL #2  ? Title Pt. will demonstrate improved cervical AROM with 60 deg without pain all directions for safety with driving.   ? Baseline see flowsheet, very slow gaurded painful movements.   ? Time 6   ? Period Weeks   ? Status Partially met, 60 deg rotation bil, 50 deg flexion,extension.    ? Target Date 02/09/22   ?  ? PT LONG TERM GOAL #3  ? Title Pt. will demonstrate improved functional ability as demonstrated by 60% on FOTO.   ? Baseline 34% on FOTO = 66% disability   ? Time 6   ? Period Weeks   ? Status Partially met - 56% on FOTO  ? Target Date 02/09/22   ?  ? PT LONG TERM GOAL #4  ? Title Pt. will report 75% improvement in neck/upper back pain to improve QOL   ? Time 6   ? Period Weeks   ? Status Not Met - decreased pain today but has not worked x 2 days.  Still having severe pain when working.   ? Target Date 02/09/22   ? ?  ?  ? ?  ? ?PLAN:  ? ?  PT Frequency 2x / week   ?  PT  Duration 6 weeks   ?  PT Treatment/Interventions ADLs/Self Care Home Management;Cryotherapy;Electrical Stimulation;Moist Heat;Traction;Ultrasound;Functional mobility training;Therapeutic activities;Therapeutic e

## 2022-02-10 ENCOUNTER — Encounter: Payer: 59 | Admitting: Physical Therapy

## 2022-02-13 ENCOUNTER — Ambulatory Visit: Payer: 59 | Admitting: Neurology

## 2022-02-13 ENCOUNTER — Encounter: Payer: Self-pay | Admitting: Neurology

## 2022-02-13 VITALS — BP 126/66 | HR 82 | Ht 59.0 in | Wt 131.0 lb

## 2022-02-13 DIAGNOSIS — M6289 Other specified disorders of muscle: Secondary | ICD-10-CM

## 2022-02-13 DIAGNOSIS — M2550 Pain in unspecified joint: Secondary | ICD-10-CM

## 2022-02-13 DIAGNOSIS — R21 Rash and other nonspecific skin eruption: Secondary | ICD-10-CM | POA: Diagnosis not present

## 2022-02-13 DIAGNOSIS — M5412 Radiculopathy, cervical region: Secondary | ICD-10-CM | POA: Diagnosis not present

## 2022-02-13 DIAGNOSIS — M5416 Radiculopathy, lumbar region: Secondary | ICD-10-CM

## 2022-02-13 NOTE — Progress Notes (Unsigned)
[facility]    Provider:  Dr Jaynee Eagles Requesting Provider: Emergency room Primary Care Provider:  Mosie Lukes, MD  CC: rashes, arthralgias, stiffness  Follow up 02/13/2022: She comes with a list today of concerns. She has a lot of tightness in the neck, she was in the emergency room last weekend she saw a rheumatologist, she wakes up stiff every morning, random muscles, it moves around, recently her calves and the neck is better.  So symptoms started in May 2021 after COVID, from then on neck pain and back pain for which we have seen patient for in the past in January 2023 started scuffing or tripping on the left foot, heaviness in the left leg, in March fell had whiplash numbness in the face now week heavy feeling leg and some decreased range of motion, 2 weeks ago started feeli feeling tingling and numbness in the left leg, torso, face throat arm chest cramping and muscle spasms with numbness and tingling, randomly occurs in different parts of the body and times, hands feet get cold randomly at time of rash. Te rsah is across the front of the face, she has rashes on her body, today she appears as though she has a rash Maller rash and rash on the forehead, she shows me some pictures where she has several rashes on the abdomen on the forehead on the neck in different places,she saw a rheumatologist, just did not feel as if she had psoriatic arthritis that it might of been eczema, started Dupixent, the rash improved but now has more of a rash on her face, wakes up stiff, has had an MRI of the brain and the cervical spine right, EMG of the upper extremities. Stiffness, joint pain, rashes, muscle spasms, no weakness, cold and hot extremities can be left or right, no numbness or tingling currently (has ulnar neuropathy. Also family history in mother of these symptoms.   Patient complains of symptoms per HPI as well as the following symptoms: rashes, joint pain . Pertinent negatives and  positives per HPI. All others negative   HPI:  Whitney Trujillo is a 46 y.o. female here as requested by emergency room for headaches.  Past medical history migraines, hyperlipidemia, depression, anxiety, ADD.  I reviewed emergency room notes, patient had sudden onset headache during intercourse March 08, 2020 when she was seen in the emergency room it had improved, similar to previous migraine but more severe, has had post ictal headaches in the past but never this severe, neurologic exam is nonfocal, CTA was reassuring without aneurysm, she declined lumbar puncture, her headache improved and she wished to go home, there was no evidence of aneurysm or subarachnoid hemorrhage but she was warned that about 1% of the time its not seen on CAT scan she still declined lumbar puncture.  She described it as a severe generalized headache and pain into her neck, not worsening on movement, no vision changes, vomiting or confusion, no numbness weakness or tingling into her arms or legs, she works as a Theme park manager, she has a history of blood clots, Tylenol and migraine medication did not help and the pain was 9 out of 10.  She has had headache at orgasm 4 times, this last one was very severe 03/07/2020, 9/10 pain, CTA was negative and she declined LP, since then she saw an orthopaedist because her neck and back always hurt she had an MRI cervical spine and was started on Gabapentin which has helped her headaches. She was sent  to Rheumatology for joint pain to evaluate for RF or other but hasn;t gone yet (she has psoriasis), She has a history of migraines especially with her cycles, pusating/pounding, light and sound sensitivity, nausea, vomiting, has had them many years stable 2x a month, maxalt heps with the migraines may have to take it twice, taking gabapentin 1-2x a day and has helped with the migraines, no exertional headaches since June. 2 migraines a month and rarely coital headache. She has had a few sharp pains  in the morning when she is standing and getting ready and she leans over a little bit, positional. When the headache happens during sex it starts in the back of the head and radiates and shoots up the head of the back of the eye.No other focal neurologic deficits, associated symptoms, inciting events or modifiable factors.    Reviewed notes, labs and imaging from outside physicians, which showed:  Personally revewed images and agree with the following  Brain: There is no mass, hemorrhage or extra-axial collection. The size and configuration of the ventricles and extra-axial CSF spaces are normal. There is no acute or chronic infarction. The brain parenchyma is normal.   Skull: The visualized skull base, calvarium and extracranial soft tissues are normal.   Sinuses/Orbits: No fluid levels or advanced mucosal thickening of the visualized paranasal sinuses. No mastoid or middle ear effusion. The orbits are normal.   CTA NECK FINDINGS   SKELETON: There is no bony spinal canal stenosis. No lytic or blastic lesion.   OTHER NECK: Normal pharynx, larynx and major salivary glands. No cervical lymphadenopathy. Unremarkable thyroid gland.   UPPER CHEST: No pneumothorax or pleural effusion. No nodules or masses.   AORTIC ARCH:   There is no calcific atherosclerosis of the aortic arch. There is no aneurysm, dissection or hemodynamically significant stenosis of the visualized portion of the aorta. Conventional 3 vessel aortic branching pattern. The visualized proximal subclavian arteries are widely patent.   RIGHT CAROTID SYSTEM: Normal without aneurysm, dissection or stenosis.   LEFT CAROTID SYSTEM: Normal without aneurysm, dissection or stenosis.   VERTEBRAL ARTERIES: Left dominant configuration. Both origins are clearly patent. There is no dissection, occlusion or flow-limiting stenosis to the skull base (V1-V3 segments).   CTA HEAD FINDINGS   POSTERIOR CIRCULATION:    --Vertebral arteries: Normal V4 segments.   --Inferior cerebellar arteries: Normal.   --Basilar artery: Normal.   --Superior cerebellar arteries: Normal.   --Posterior cerebral arteries (PCA): Normal.   ANTERIOR CIRCULATION:   --Intracranial internal carotid arteries: Normal.   --Anterior cerebral arteries (ACA): Normal. Both A1 segments are present. Patent anterior communicating artery (a-comm).   --Middle cerebral arteries (MCA): Normal.   VENOUS SINUSES: As permitted by contrast timing, patent.   ANATOMIC VARIANTS: None   Review of the MIP images confirms the above findings.   IMPRESSION: Normal CTA of the head and neck.  Reviewed MRI of the cervical spine report April 12, 2020 which showed severe left C2-C3 neural foraminal narrowing, impinging upon the exiting left C3 nerve roots, due to severe left uncovertebral osteophytosis and severe left facet arthrosis, moderate right C7-T1 neuroforaminal narrowing due to severe right facet arthrosis, mild left C5-C6 neural foraminal narrowing, partially visualized moderate broad central T3-T4 disc osteophyte complex with mild/moderate deformity of central cord without spinal canal stenosis, straightening of the normal cervical lordosis.   Review of Systems: Patient complains of symptoms per HPI as well as the following symptoms: Headache, neck pain, shoulder pain, paresthesias. Pertinent negatives and  positives per HPI. All others negative.   Social History   Socioeconomic History   Marital status: Married    Spouse name: Not on file   Number of children: Not on file   Years of education: Not on file   Highest education level: Not on file  Occupational History   Not on file  Tobacco Use   Smoking status: Never   Smokeless tobacco: Never  Vaping Use   Vaping Use: Never used  Substance and Sexual Activity   Alcohol use: Yes    Comment: Socially   Drug use: No   Sexual activity: Yes    Partners: Male    Birth  control/protection: Surgical    Comment: lives with husband (ADD) and children, 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  Other Topics Concern   Not on file  Social History Narrative   Lives at home with husband and 2 children   Right handed   Caffeine: 2-3 cups/day   Social Determinants of Health   Financial Resource Strain: Not on file  Food Insecurity: Not on file  Transportation Needs: Not on file  Physical Activity: Not on file  Stress: Not on file  Social Connections: Not on file  Intimate Partner Violence: Not on file    Family History  Problem Relation Age of Onset   Cancer Father        Carcinoid Syndrome, colon   Colon cancer Father    Headache Father    Diabetes Mother        2, diet controlled   ADD / ADHD Son    Heart disease Maternal Grandmother    Stroke Paternal Grandmother    Dementia Paternal Grandmother    COPD Paternal Grandfather        black lun, Forensic scientist, tobacco   Ovarian cancer Maternal Aunt     Past Medical History:  Diagnosis Date   Acute pulmonary embolism  5 wks post hysterectomy 07/07/2014   Acute right lower quadrant pain 01/25/2016   ADD (attention deficit disorder) 02/02/2015   Allergy    seasonal allergies   Anxiety    Colon cancer screening 12/16/2015   Depression with anxiety 03/15/2016   Endometriosis    Flank pain 05/03/2021   Gallstones    Gross hematuria 05/03/2021   History of COVID-19 02/24/2020   Hyperlipidemia, mixed 02/02/2015   Migraine 12/16/2015   Preventative health care 12/16/2015   Pulmonary emboli (HCC)    b/l PE after hysterectomy, 5 weeks post op, clotting problem ruled out at [facility]    Patient Active Problem List   Diagnosis Date Noted   Rash 02/14/2022   Muscle stiffness 02/14/2022   Arthralgia 02/14/2022   Cervical radiculopathy 02/14/2022   Lumbar radiculopathy 02/14/2022   Left-sided weakness 02/03/2022   Eczema 12/17/2021   Psoriasis 11/09/2020   Arthritis 07/05/2020   Bilateral occipital  neuralgia 05/04/2020   Exertional headache 05/04/2020   Migraine without aura and without status migrainosus, not intractable 05/04/2020   Bilateral carpal tunnel syndrome 10/19/2017   Hyperlipidemia, mixed 02/02/2015   Vitamin D deficiency 02/02/2015   ADD (attention deficit disorder) 02/02/2015   Allergy    Endometriosis 07/07/2014    Past Surgical History:  Procedure Laterality Date   ABDOMINAL HYSTERECTOMY     ovaries removed, in 2 separate procedures   CESAREAN SECTION  2007, 2009   CHOLECYSTECTOMY     CYSTOSCOPY  10/2014   EYE SURGERY     lasik b/l  LAPAROTOMY     Endometriosis   WISDOM TOOTH EXTRACTION      Current Outpatient Medications  Medication Sig Dispense Refill   cyclobenzaprine (FLEXERIL) 5 MG tablet Take 5 mg by mouth 3 (three) times daily as needed for muscle spasms.     Dupilumab (DUPIXENT) 300 MG/2ML SOPN Inject 300 mg into the skin every 14 (fourteen) days. 3.92 mL    FLUoxetine (PROZAC) 20 MG capsule Take 1 capsule (20 mg total) by mouth daily. (Patient taking differently: Take 20 mg by mouth at bedtime.) 90 capsule 1   gabapentin (NEURONTIN) 100 MG capsule Take 1 capsule (100 mg total) by mouth 3 (three) times daily as needed (pain). 20 capsule 0   meloxicam (MOBIC) 15 MG tablet Take 1 tablet (15 mg total) by mouth daily. (Patient taking differently: Take 15 mg by mouth at bedtime.) 30 tablet 3   methocarbamol (ROBAXIN) 500 MG tablet Take 500 mg by mouth 2 (two) times daily as needed for muscle spasms.     OVER THE COUNTER MEDICATION Take 4 capsules by mouth daily. Nutrafol-     Triamcinolone Acetonide (NASACORT AQ NA) Place 1 spray into both nostrils daily as needed (allergies).     triamcinolone ointment (KENALOG) 0.1 % Apply 1 application. topically 2 (two) times daily as needed (rash).     No current facility-administered medications for this visit.    Allergies as of 02/13/2022 - Review Complete 02/13/2022  Allergen Reaction Noted   Latex Itching  03/13/2013   Gluten meal Other (See Comments) 03/13/2013    Vitals: BP 126/66   Pulse 82   Ht $R'4\' 11"'AG$  (1.499 m)   Wt 131 lb (59.4 kg)   BMI 26.46 kg/m  Last Weight:  Wt Readings from Last 1 Encounters:  02/13/22 131 lb (59.4 kg)   Last Height:   Ht Readings from Last 1 Encounters:  02/13/22 $RemoveB'4\' 11"'yTMKwmbH$  (1.499 m)  Exam: NAD, pleasant                  Speech:    Speech is normal; fluent and spontaneous with normal comprehension.  Cognition:    The patient is oriented to person, place, and time;     recent and remote memory intact;     language fluent;    Cranial Nerves:    The pupils are equal, round, and reactive to light.Trigeminal sensation is intact and the muscles of mastication are normal. The face is symmetric. The palate elevates in the midline. Hearing intact. Voice is normal. Shoulder shrug is normal. The tongue has normal motion without fasciculations.   Coordination:  No dysmetria  Motor Observation:    No asymmetry, no atrophy, and no involuntary movements noted. Tone:    Normal muscle tone.     Strength:     Mild prox left arm weakness and distal leg flex/ex weakness      Sensation: intact to LT     Assessment/Plan:  46 y.o. female see in the past for headaches here today for arthralgias, stiffness,rashes .  Past medical history migraines, hyperlipidemia, depression, anxiety, ADD.  02/03/2022: MRI of the entire neuro axis MRI brain(normal),  MRI Cervical: Multifactorial degenerative changes with resultant multilevel foraminal narrowing as above. Notable findings include severe left C3 foraminal stenosis, moderate bilateral C4 foraminal narrowing, with mild left C5 and C6 foraminal stenosis MRI thoracic : normal spinal cord. Small disc protrusions involving the T3-4 through T8-9  MRI lumbar spine: Small central disc protrusion with annular fissure at  L5-S1, closely approximating the left greater than right descending S1 nerve roots without frank  impingement  Contacted Dr. Estanislado Pandy, she has rashes, reviewed images with patient and forwarded to dr deveshwar, today with what appears to be a facial/malar rash, unclear if she needs rheumatology but will refer to Dr. Estanislado Pandy for review. Dr. Estanislado Pandy kindly also suggested a derm referral for biopsy, appreciate my colleague.   Mild prox left arm weakness and distal leg flex/ex weakness: Given MRI c-spine  and l-spine with degenerative disease this is likely be due to radiculopathy. MRI brain was neg for acute event/stroke. Mri of entire neuro axis with normal spinal cord.  Episodic migraines: Continue to take Maxalt as needed for episodic migraines.  She feels the gabapentin helps with the migraines as well.  Exertional headaches: Appears to be occipital neuralgia, several times during coitus she has had shooting pain coming up in the back of her head to the top, severe, burning, brief, she has C2-C3 degenerative changes with severe foraminal stenosis so this is likely contributing.  Prior CTA of the head and neck as well as CT of the head were normal without aneurysm.  MRI of the cervical spine showed normal cord but multilevel degenerative changes worse at C2-C3 explaining the occipital neuralgic pain.   Orders Placed This Encounter  Procedures   ANA Comprehensive Panel   Heavy metals, blood   Vitamin B6   Multiple Myeloma Panel (SPEP&IFE w/QIG)   Vitamin B1   Folate   ANA, IFA (with reflex)   ANCA Profile   ANCA Profile   Ambulatory referral to Rheumatology   Ambulatory referral to Dermatology   Cc:  Mosie Lukes, MD  Sarina Ill, MD  [facility] [address] [place], [place] [address]  Phone [phone] Fax [phone]  I spent over 45  minutes of face-to-face and non-face-to-face time with patient on the  1. Rash   2. Muscle stiffness   3. Arthralgia, unspecified joint   4. Cervical radiculopathy   5. Lumbar radiculopathy      diagnosis.  This included previsit chart review, lab review, study review, order entry, electronic health record documentation, patient education on the different diagnostic and therapeutic options, counseling and coordination of care, risks and benefits of management, compliance, or risk factor reduction. This also includes imaging review of additional 15 minutes outside of appointment time.

## 2022-02-13 NOTE — Patient Instructions (Signed)
Bloodwork today Referral [employer]

## 2022-02-14 ENCOUNTER — Telehealth: Payer: Self-pay | Admitting: Neurology

## 2022-02-14 ENCOUNTER — Encounter: Payer: Self-pay | Admitting: Neurology

## 2022-02-14 ENCOUNTER — Encounter: Payer: 59 | Admitting: Physical Therapy

## 2022-02-14 DIAGNOSIS — M2550 Pain in unspecified joint: Secondary | ICD-10-CM | POA: Insufficient documentation

## 2022-02-14 DIAGNOSIS — M5416 Radiculopathy, lumbar region: Secondary | ICD-10-CM | POA: Insufficient documentation

## 2022-02-14 DIAGNOSIS — M6289 Other specified disorders of muscle: Secondary | ICD-10-CM | POA: Insufficient documentation

## 2022-02-14 DIAGNOSIS — M5412 Radiculopathy, cervical region: Secondary | ICD-10-CM | POA: Insufficient documentation

## 2022-02-14 DIAGNOSIS — R21 Rash and other nonspecific skin eruption: Secondary | ICD-10-CM | POA: Insufficient documentation

## 2022-02-14 NOTE — Telephone Encounter (Signed)
Referral for Dermatology sent to [facility] [phone]. 

## 2022-02-16 ENCOUNTER — Encounter: Payer: 59 | Admitting: Physical Therapy

## 2022-02-16 LAB — ANA COMPREHENSIVE PANEL
Anti JO-1: <0.2 AI (ref 0.0–0.9)
Centromere Ab Screen: <0.2 AI (ref 0.0–0.9)
Chromatin Ab SerPl-aCnc: <0.2 AI (ref 0.0–0.9)
ENA RNP Ab: <0.2 AI (ref 0.0–0.9)
ENA SM Ab Ser-aCnc: <0.2 AI (ref 0.0–0.9)
ENA SSA (RO) Ab: 0.2 AI (ref 0.0–0.9)
ENA SSB (LA) Ab: <0.2 AI (ref 0.0–0.9)
Scleroderma (Scl-70) (ENA) Antibody, IgG: <0.2 AI (ref 0.0–0.9)
dsDNA Ab: <1 IU/mL (ref 0–9)

## 2022-02-16 LAB — HEAVY METALS, BLOOD
Arsenic: 1 ug/L (ref 0–9)
Lead, Blood: 1 ug/dL (ref 0.0–3.4)
Mercury: <1 ug/L (ref 0.0–14.9)

## 2022-02-16 LAB — MULTIPLE MYELOMA PANEL, SERUM
Albumin SerPl Elph-Mcnc: 4.2 g/dL (ref 2.9–4.4)
Albumin/Glob SerPl: 1.6 (ref 0.7–1.7)
Alpha 1: 0.2 g/dL (ref 0.0–0.4)
Alpha2 Glob SerPl Elph-Mcnc: 0.6 g/dL (ref 0.4–1.0)
B-Globulin SerPl Elph-Mcnc: 1.1 g/dL (ref 0.7–1.3)
Gamma Glob SerPl Elph-Mcnc: 0.8 g/dL (ref 0.4–1.8)
Globulin, Total: 2.8 g/dL (ref 2.2–3.9)
IgA/Immunoglobulin A, Serum: 215 mg/dL (ref 87–352)
IgG (Immunoglobin G), Serum: 812 mg/dL (ref 586–1602)
IgM (Immunoglobulin M), Srm: 62 mg/dL (ref 26–217)
Total Protein: 7 g/dL (ref 6.0–8.5)

## 2022-02-16 LAB — ANCA PROFILE
Anti-MPO Antibodies: <0.2 units (ref 0.0–0.9)
Anti-PR3 Antibodies: <0.2 units (ref 0.0–0.9)
Atypical pANCA: <1:20 {titer}
C-ANCA: <1:20 {titer}
P-ANCA: <1:20 {titer}

## 2022-02-16 LAB — VITAMIN B6: Vitamin B6: 16.1 ug/L (ref 3.4–65.2)

## 2022-02-16 LAB — ANTINUCLEAR ANTIBODIES, IFA: ANA Titer 1: NEGATIVE

## 2022-02-16 LAB — FOLATE: Folate: 12.6 ng/mL (ref >3.0)

## 2022-02-16 LAB — VITAMIN B1: Thiamine: 118.7 nmol/L (ref 66.5–200.0)

## 2022-02-21 ENCOUNTER — Encounter: Payer: 59 | Admitting: Physical Therapy

## 2022-02-21 ENCOUNTER — Ambulatory Visit (HOSPITAL_BASED_OUTPATIENT_CLINIC_OR_DEPARTMENT_OTHER)
Admission: RE | Admit: 2022-02-21 | Discharge: 2022-02-21 | Disposition: A | Discharge status: Home / Self Care | Payer: 59 | Source: Ambulatory Visit | Attending: Family Medicine | Admitting: Family Medicine

## 2022-02-21 ENCOUNTER — Encounter (HOSPITAL_BASED_OUTPATIENT_CLINIC_OR_DEPARTMENT_OTHER): Payer: Self-pay

## 2022-02-21 DIAGNOSIS — Z1231 Encounter for screening mammogram for malignant neoplasm of breast: Secondary | ICD-10-CM | POA: Diagnosis present

## 2022-02-28 ENCOUNTER — Ambulatory Visit: Payer: 59 | Admitting: Family Medicine

## 2022-02-28 ENCOUNTER — Other Ambulatory Visit: Payer: Self-pay | Admitting: *Deleted

## 2022-02-28 ENCOUNTER — Encounter: Payer: Self-pay | Admitting: Family Medicine

## 2022-02-28 ENCOUNTER — Encounter: Payer: Self-pay | Admitting: Physical Therapy

## 2022-02-28 VITALS — BP 126/62 | HR 79 | Ht 59.0 in | Wt 129.2 lb

## 2022-02-28 DIAGNOSIS — E559 Vitamin D deficiency, unspecified: Secondary | ICD-10-CM

## 2022-02-28 DIAGNOSIS — Z1211 Encounter for screening for malignant neoplasm of colon: Secondary | ICD-10-CM

## 2022-02-28 DIAGNOSIS — Z90710 Acquired absence of both cervix and uterus: Secondary | ICD-10-CM

## 2022-02-28 DIAGNOSIS — N951 Menopausal and female climacteric states: Secondary | ICD-10-CM

## 2022-02-28 DIAGNOSIS — F418 Other specified anxiety disorders: Secondary | ICD-10-CM

## 2022-02-28 MED ORDER — FLUOXETINE HCL 20 MG PO CAPS: 20.0000 mg | ORAL_CAPSULE | Freq: Every day | ORAL | 0 refills | Status: DC

## 2022-02-28 NOTE — Patient Instructions (Addendum)
Bone density scan ordered. Make sure they let you know if it will run through your insurance before you have the scan done.  Let us know if you decide you want a referral to a spine specialist.  Regarding hormones, we discussed potential options and risks vs benefits. Additional information sheets attached for you to review and consider. If you decide you want to proceed, let us know. GYN and Delrae Rend MD would also have other options to consider as well.

## 2022-02-28 NOTE — Progress Notes (Signed)
Acute Office Visit  Subjective:     Patient ID: Whitney Trujillo, female    DOB: Aug 29, 1976, 46 y.o.   MRN: [id number]  CC: concerns for osteopenia  HPI Patient is in today for concerns for osteopenia.   Patient  has been having ongoing back pain for awhile now. 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 She has been following with neurology for this problem along with several other complaints and reports their workup has been negative overall. Imaging has revealed significant facet arthritis and bulging discs, varying in severity. Neurology has referred her to rheumatology for a potential autoimmune component given that rashes are also part of her symptoms - she cannot get in with them for several month. They also recommend she follow up with her dermatologist given the intermittent rashes - she will be seeing them this summer.  Patient is reporting concerns for osteopenia and would like a DEXA ordered. States that given the spine images, her discomfort, family history of arthritis and osteopenia, and her personal history of total hysterectomy over 5 years ago, she would like to start with early screening for osteoporosis. Additionally, she is scheduled with ortho for initial consult later today to see what further workup/treatment they can offer her for her back pain symptoms. She is open to considering a spine specialist referral pending how the ortho appointment goes. She also reports that she is considering starting back on hormone replacement. Reports that she was following with Delrae Rend MD until last year and had done a total of about 4 years of hormone replacement, but she stopped due to the cost and time commitment       ROS All review of systems negative except what is listed in the HPI      Objective:    BP 126/62   Pulse 79   Ht 4\' 11"  (1.499 m)   Wt 129 lb 3.2 oz (58.6 kg)    BMI 26.10 kg/m    Physical Exam Vitals reviewed.  Constitutional:      General: She is not in acute distress.    Appearance: Normal appearance. She is normal weight. She is not ill-appearing.  Cardiovascular:     Rate and Rhythm: Normal rate and regular rhythm.  Pulmonary:     Effort: Pulmonary effort is normal.     Breath sounds: Normal breath sounds.  Skin:    General: Skin is warm and dry.  Neurological:     General: No focal deficit present.     Mental Status: She is alert and oriented to person, place, and time. Mental status is at baseline.  Psychiatric:        Mood and Affect: Mood normal.        Behavior: Behavior normal.        Thought Content: Thought content normal.        Judgment: Judgment normal.    No results found for any visits on 02/28/22.      Assessment & Plan:   1. Vitamin D deficiency 2. Menopausal state 3. H/O total hysterectomy Bone density scan ordered. Make sure they let you know if it will run through your insurance before you have the scan done.  Let 04/30/22 know if you decide you want a referral to a spine specialist.  Regarding hormones, we discussed potential options and risks vs benefits. Additional information sheets attached for  you to review and consider. If you decide you want to proceed, let us know. GYN and Delrae Rend MD would also have other options to consider as well.   - DG Bone Density; Future    No orders of the defined types were placed in this encounter.   Return if symptoms worsen or fail to improve.  Clayborne Dana, NP

## 2022-03-02 ENCOUNTER — Ambulatory Visit: Payer: 59 | Admitting: Family Medicine

## 2022-03-08 ENCOUNTER — Encounter: Payer: Self-pay | Admitting: Internal Medicine

## 2022-03-09 ENCOUNTER — Ambulatory Visit (HOSPITAL_BASED_OUTPATIENT_CLINIC_OR_DEPARTMENT_OTHER)
Admission: RE | Admit: 2022-03-09 | Discharge: 2022-03-09 | Disposition: A | Discharge status: Home / Self Care | Payer: 59 | Source: Ambulatory Visit | Attending: Family Medicine | Admitting: Family Medicine

## 2022-03-09 DIAGNOSIS — E559 Vitamin D deficiency, unspecified: Secondary | ICD-10-CM | POA: Diagnosis not present

## 2022-03-09 DIAGNOSIS — Z90710 Acquired absence of both cervix and uterus: Secondary | ICD-10-CM | POA: Insufficient documentation

## 2022-03-09 DIAGNOSIS — N951 Menopausal and female climacteric states: Secondary | ICD-10-CM | POA: Diagnosis present

## 2022-03-10 ENCOUNTER — Other Ambulatory Visit: Payer: Self-pay | Admitting: *Deleted

## 2022-03-10 DIAGNOSIS — M2550 Pain in unspecified joint: Secondary | ICD-10-CM

## 2022-03-10 DIAGNOSIS — R21 Rash and other nonspecific skin eruption: Secondary | ICD-10-CM

## 2022-03-10 DIAGNOSIS — M6289 Other specified disorders of muscle: Secondary | ICD-10-CM

## 2022-03-13 ENCOUNTER — Telehealth: Payer: Self-pay | Admitting: Family Medicine

## 2022-03-13 NOTE — Telephone Encounter (Signed)
Whitney Trujillo [facility]) called requesting labs be sent over in regards to referral that was placed with their practice to the following fax number:  [phone] Attn: Omega

## 2022-03-13 NOTE — Telephone Encounter (Signed)
I have faxed all labs done during 2023.

## 2022-04-13 ENCOUNTER — Ambulatory Visit (AMBULATORY_SURGERY_CENTER): Payer: 59

## 2022-04-13 VITALS — Ht 59.0 in | Wt 128.0 lb

## 2022-04-13 DIAGNOSIS — Z800 Family history of malignant neoplasm of digestive organs: Secondary | ICD-10-CM

## 2022-04-13 MED ORDER — NA SULFATE-K SULFATE-MG SULF 17.5-3.13-1.6 GM/177ML PO SOLN: 1.0000 | Freq: Once | ORAL | 0 refills | Status: AC

## 2022-04-13 NOTE — Progress Notes (Signed)
No egg or soy allergy known to patient  No issues known to pt with past sedation with any surgeries or procedures Patient denies ever being told they had issues or difficulty with intubation  No FH of Malignant Hyperthermia Pt is not on diet pills Pt is not on  home 02  Pt is not on blood thinners  Pt denies issues with constipation  No A fib or A flutter   Pt to hold Celebrex x7 days, instruct pt to notify Dr Rhea Belton if  any questions regarding holding Celebrex if pt is having pain, pt states her pain is severe if unbale to take Celebrex, tylenol may help per pt.  Pt instructed to use Singlecare.com or GoodRx for a price reduction on prep

## 2022-04-25 ENCOUNTER — Other Ambulatory Visit: Payer: Self-pay | Admitting: Family Medicine

## 2022-04-25 DIAGNOSIS — F418 Other specified anxiety disorders: Secondary | ICD-10-CM

## 2022-05-02 ENCOUNTER — Encounter: Payer: Self-pay | Admitting: Internal Medicine

## 2022-05-08 ENCOUNTER — Encounter: Payer: Self-pay | Admitting: Internal Medicine

## 2022-05-08 ENCOUNTER — Ambulatory Visit (AMBULATORY_SURGERY_CENTER): Payer: 59 | Admitting: Internal Medicine

## 2022-05-08 VITALS — BP 103/70 | HR 64 | Temp 97.8°F | Resp 14 | Ht 59.0 in | Wt 128.0 lb

## 2022-05-08 DIAGNOSIS — Z800 Family history of malignant neoplasm of digestive organs: Secondary | ICD-10-CM

## 2022-05-08 DIAGNOSIS — Z1211 Encounter for screening for malignant neoplasm of colon: Secondary | ICD-10-CM

## 2022-05-08 MED ORDER — SODIUM CHLORIDE 0.9 % IV SOLN: 500.0000 mL | Freq: Once | INTRAVENOUS | Status: DC

## 2022-05-08 NOTE — Progress Notes (Signed)
GASTROENTEROLOGY PROCEDURE H&P NOTE   Primary Care Physician: Mosie Lukes, MD    Reason for Procedure:  Family history of colonic carcinoma  Plan:    Colonoscopy  Patient is appropriate for endoscopic procedure(s) in the ambulatory ([place]) setting.  The nature of the procedure, as well as the risks, benefits, and alternatives were carefully and thoroughly reviewed with the patient. Ample time for discussion and questions allowed. The patient understood, was satisfied, and agreed to proceed.     HPI: Whitney Trujillo is a 46 y.o. female who presents for screening colonoscopy.  Medical history as below.  Tolerated the prep.  No recent chest pain or shortness of breath.  No abdominal pain today.  Past Medical History:  Diagnosis Date   Acute pulmonary embolism  5 wks post hysterectomy 07/07/2014   Acute right lower quadrant pain 01/25/2016   ADD (attention deficit disorder) 02/02/2015   Allergy    seasonal allergies   Anxiety    Arthritis    Colon cancer screening 12/16/2015   Depression with anxiety 03/15/2016   Endometriosis    Flank pain 05/03/2021   Gallstones    Gross hematuria 05/03/2021   History of COVID-19 02/24/2020   Hyperlipidemia, mixed 02/02/2015   Migraine 12/16/2015   Preventative health care 12/16/2015   Pulmonary emboli (HCC)    b/l PE after hysterectomy, 5 weeks post op, clotting problem ruled out at [facility]    Past Surgical History:  Procedure Laterality Date   ABDOMINAL HYSTERECTOMY  2010   1- ovary then second ovary ovary later   CESAREAN SECTION  2007, 2009   CHOLECYSTECTOMY  2015   CYSTOSCOPY  10/2014   EYE SURGERY     lasik b/l   LAPAROTOMY     Endometriosis   OOPHORECTOMY  0000000   UMBILICAL HERNIA REPAIR  2019   also epigastric hernia removed at the same time per pt   WISDOM TOOTH EXTRACTION      Prior to Admission medications   Medication Sig Start Date End Date Taking? Authorizing Provider  cyclobenzaprine (FLEXERIL) 5 MG  tablet Take 5 mg by mouth 3 (three) times daily as needed for muscle spasms. 12/21/21  Yes [provider]  DULoxetine (CYMBALTA) 20 MG capsule Take 20 mg by mouth daily. 04/20/22  Yes [provider]  Dupilumab (DUPIXENT) 300 MG/2ML SOPN Inject 300 mg into the skin every 14 (fourteen) days. 05/09/21  Yes Mosie Lukes, MD  estradiol (VIVELLE-DOT) 0.025 MG/24HR Place 1 patch onto the skin 2 (two) times a week.   Yes [provider]  Omega 3-6-9 Fatty Acids (OMEGA 3-6-9 COMPLEX PO) Take 2 capsules by mouth daily at 8 pm. 1600 mg   Yes [provider]  OVER THE COUNTER MEDICATION Take 4 capsules by mouth daily. Nutrafol-   Yes [provider]  Turmeric 500 MG CAPS See admin instructions.   Yes [provider]  celecoxib (CELEBREX) 200 MG capsule 1 capsule with food 02/28/22   [provider]  gabapentin (NEURONTIN) 100 MG capsule Take 1 capsule (100 mg total) by mouth 3 (three) times daily as needed (pain). 02/04/22   Eugenie Filler, MD  methocarbamol (ROBAXIN) 500 MG tablet Take 500 mg by mouth 2 (two) times daily as needed for muscle spasms. 12/27/21   [provider]  OVER THE COUNTER MEDICATION Take 1 Dose by mouth daily at 8 pm. 3 tsp powder consisting of: Magnesium carbonate 320 mg, vitamin C 265 mg,  vitamin D 3 mcg, calcium 210 mg, potassium 110 mg    [provider]  Triamcinolone Acetonide (NASACORT AQ NA) Place 1 spray into both nostrils daily as needed (allergies).    [provider]  triamcinolone ointment (KENALOG) 0.1 % Apply 1 application. topically 2 (two) times daily as needed (rash). 01/10/22   [provider]    Current Outpatient Medications  Medication Sig Dispense Refill   cyclobenzaprine (FLEXERIL) 5 MG tablet Take 5 mg by mouth 3 (three) times daily as needed for muscle spasms.     DULoxetine (CYMBALTA) 20 MG capsule Take 20 mg by mouth daily.     Dupilumab (DUPIXENT) 300 MG/2ML  SOPN Inject 300 mg into the skin every 14 (fourteen) days. 3.92 mL    estradiol (VIVELLE-DOT) 0.025 MG/24HR Place 1 patch onto the skin 2 (two) times a week.     Omega 3-6-9 Fatty Acids (OMEGA 3-6-9 COMPLEX PO) Take 2 capsules by mouth daily at 8 pm. 1600 mg     OVER THE COUNTER MEDICATION Take 4 capsules by mouth daily. Nutrafol-     Turmeric 500 MG CAPS See admin instructions.     celecoxib (CELEBREX) 200 MG capsule 1 capsule with food     gabapentin (NEURONTIN) 100 MG capsule Take 1 capsule (100 mg total) by mouth 3 (three) times daily as needed (pain). 20 capsule 0   methocarbamol (ROBAXIN) 500 MG tablet Take 500 mg by mouth 2 (two) times daily as needed for muscle spasms.     OVER THE COUNTER MEDICATION Take 1 Dose by mouth daily at 8 pm. 3 tsp powder consisting of: Magnesium carbonate 320 mg, vitamin C 265 mg, vitamin D 3 mcg, calcium 210 mg, potassium 110 mg     Triamcinolone Acetonide (NASACORT AQ NA) Place 1 spray into both nostrils daily as needed (allergies).     triamcinolone ointment (KENALOG) 0.1 % Apply 1 application. topically 2 (two) times daily as needed (rash).     Current Facility-Administered Medications  Medication Dose Route Frequency Provider Last Rate Last Admin   0.9 %  sodium chloride infusion  500 mL Intravenous Once Zeina Akkerman, Carie Caddy, MD        Allergies as of 05/08/2022 - Review Complete 05/08/2022  Allergen Reaction Noted   Latex Itching 03/13/2013   Gluten meal Other (See Comments) 03/13/2013    Family History  Problem Relation Age of Onset   Diabetes Mother        2, diet controlled   Colon cancer Father        age 77   Cancer Father        Carcinoid Syndrome, colon   Headache Father    Ovarian cancer Maternal Aunt    Heart disease Maternal Grandmother    Stroke Paternal Grandmother    Dementia Paternal Grandmother    COPD Paternal Grandfather        black lun, Forensic scientist, tobacco   ADD / ADHD Son    Colon polyps Neg Hx    Esophageal cancer Neg Hx     Stomach cancer Neg Hx    Rectal cancer Neg Hx     Social History   Socioeconomic History   Marital status: Married    Spouse name: Not on file   Number of children: Not on file   Years of education: Not on file   Highest education level: Not on file  Occupational History   Not on file  Tobacco Use   Smoking  status: Never   Smokeless tobacco: Never  Vaping Use   Vaping Use: Never used  Substance and Sexual Activity   Alcohol use: Not Currently    Comment: Socially, none at this time 04/13/22   Drug use: Never   Sexual activity: Yes    Partners: Male    Birth control/protection: Surgical    Comment: lives with husband (ADD) and children, 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  Other Topics Concern   Not on file  Social History Narrative   Lives at home with husband and 2 children   Right handed   Caffeine: 2-3 cups/day   Social Determinants of Health   Financial Resource Strain: Not on file  Food Insecurity: Not on file  Transportation Needs: Not on file  Physical Activity: Not on file  Stress: Not on file  Social Connections: Not on file  Intimate Partner Violence: Not on file    Physical Exam: Vital signs in last 24 hours: @BP  (!) 95/53   Pulse 80   Temp 97.8 F (36.6 C) (Temporal)   Ht 4\' 11"  (1.499 m)   Wt 128 lb (58.1 kg)   SpO2 98%   BMI 25.85 kg/m  GEN: NAD EYE: Sclerae anicteric ENT: MMM CV: Non-tachycardic Pulm: CTA b/l GI: Soft, NT/ND NEURO:  Alert & Oriented x 3   , MD [facility]  05/08/2022 9:10 AM

## 2022-05-08 NOTE — Op Note (Signed)
[facility] Patient Name: Whitney Trujillo Procedure Date: 05/08/2022 9:12 AM MRN: [id number] Endoscopist: Beverley Fiedler , MD Age: 46 Referring MD:  Date of Birth: 1976-09-11 Gender: Female Account #: [id number] Procedure:                Colonoscopy Indications:              Screening in patient at increased risk: Family                            history of 1st-degree relative with colonic                            carcinoid cancer before age 31 years Medicines:                Monitored Anesthesia Care Procedure:                Pre-Anesthesia Assessment:                           - Prior to the procedure, a History and Physical                            was performed, and patient medications and                            allergies were reviewed. The patient's tolerance of                            previous anesthesia was also reviewed. The risks                            and benefits of the procedure and the sedation                            options and risks were discussed with the patient.                            All questions were answered, and informed consent                            was obtained. Prior Anticoagulants: The patient has                            taken no previous anticoagulant or antiplatelet                            agents. ASA Grade Assessment: II - A patient with                            mild systemic disease. After reviewing the risks                            and benefits, the patient was deemed in  satisfactory condition to undergo the procedure.                           After obtaining informed consent, the colonoscope                            was passed under direct vision. Throughout the                            procedure, the patient's blood pressure, pulse, and                            oxygen saturations were monitored continuously. The                            Olympus PCF-H190DL (#[id number])  Colonoscope was                            introduced through the anus and advanced to the                            terminal ileum. The colonoscopy was performed                            without difficulty. The patient tolerated the                            procedure well. The quality of the bowel                            preparation was excellent. The terminal ileum,                            ileocecal valve, appendiceal orifice, and rectum                            were photographed. Scope In: 9:20:16 AM Scope Out: 9:32:29 AM Scope Withdrawal Time: 0 hours 7 minutes 32 seconds  Total Procedure Duration: 0 hours 12 minutes 13 seconds  Findings:                 The digital rectal exam was normal.                           The terminal ileum appeared normal.                           The entire examined colon appeared normal on direct                            and retroflexion views. Complications:            No immediate complications. Estimated Blood Loss:     Estimated blood loss: none. Impression:               - The examined portion of the ileum was normal.                           -  The entire examined colon is normal on direct and                            retroflexion views.                           - No specimens collected. Recommendation:           - Patient has a contact number available for                            emergencies. The signs and symptoms of potential                            delayed complications were discussed with the                            patient. Return to normal activities tomorrow.                            Written discharge instructions were provided to the                            patient.                           - Resume previous diet.                           - Continue present medications.                           - Repeat colonoscopy in 5 years for screening                            purposes. Beverley Fiedler,  MD 05/08/2022 9:35:02 AM This report has been signed electronically.

## 2022-05-08 NOTE — Patient Instructions (Signed)
YOU HAD AN ENDOSCOPIC PROCEDURE TODAY AT THE [facility]:   Refer to the procedure report that was given to you for any specific questions about what was found during the examination.  If the procedure report does not answer your questions, please call your gastroenterologist to clarify.  If you requested that your care partner not be given the details of your procedure findings, then the procedure report has been included in a sealed envelope for you to review at your convenience later.  YOU SHOULD EXPECT: Some feelings of bloating in the abdomen. Passage of more gas than usual.  Walking can help get rid of the air that was put into your GI tract during the procedure and reduce the bloating. If you had a lower endoscopy (such as a colonoscopy or flexible sigmoidoscopy) you may notice spotting of blood in your stool or on the toilet paper. If you underwent a bowel prep for your procedure, you may not have a normal bowel movement for a few days.  Please Note:  You might notice some irritation and congestion in your nose or some drainage.  This is from the oxygen used during your procedure.  There is no need for concern and it should clear up in a day or so.  SYMPTOMS TO REPORT IMMEDIATELY:  Following lower endoscopy (colonoscopy or flexible sigmoidoscopy):  Excessive amounts of blood in the stool  Significant tenderness or worsening of abdominal pains  Swelling of the abdomen that is new, acute  Fever of 100F or higher  For urgent or emergent issues, a gastroenterologist can be reached at any hour by calling [phone]. Do not use MyChart messaging for urgent concerns.    DIET:  We do recommend a small meal at first, but then you may proceed to your regular diet.  Drink plenty of fluids but you should avoid alcoholic beverages for 24 hours.  ACTIVITY:  You should plan to take it easy for the rest of today and you should NOT DRIVE or use heavy machinery until tomorrow (because of  the sedation medicines used during the test).    FOLLOW UP: Our staff will call the number listed on your records the next business day following your procedure.  We will call around 7:15- 8:00 am to check on you and address any questions or concerns that you may have regarding the information given to you following your procedure. If we do not reach you, we will leave a message.  If you develop any symptoms (ie: fever, flu-like symptoms, shortness of breath, cough etc.) before then, please call [phone].  If you test positive for Covid 19 in the 2 weeks post procedure, please call and report this information to us.    If any biopsies were taken you will be contacted by phone or by letter within the next 1-3 weeks.  Please call us at [phone] if you have not heard about the biopsies in 3 weeks.    SIGNATURES/CONFIDENTIALITY: You and/or your care partner have signed paperwork which will be entered into your electronic medical record.  These signatures attest to the fact that that the information above on your After Visit Summary has been reviewed and is understood.  Full responsibility of the confidentiality of this discharge information lies with you and/or your care-partner.  

## 2022-05-08 NOTE — Progress Notes (Signed)
Pt's states no medical or surgical changes since previsit or office visit. 

## 2022-05-08 NOTE — Progress Notes (Signed)
Sedate, gd SR, tolerated procedure well, VSS, report to RN 

## 2022-05-09 ENCOUNTER — Telehealth: Payer: Self-pay

## 2022-05-09 NOTE — Telephone Encounter (Signed)
  Follow up Call-     05/08/2022    8:13 AM  Call back number  Post procedure Call Back phone  # [phone]  Permission to leave phone message Yes     Patient questions:  Do you have a fever, pain , or abdominal swelling? No. Pain Score  0 *  Have you tolerated food without any problems? Yes.    Have you been able to return to your normal activities? Yes.    Do you have any questions about your discharge instructions: Diet   No. Medications  No. Follow up visit  No.  Do you have questions or concerns about your Care? No.  Actions: * If pain score is 4 or above: No action needed, pain <4.

## 2022-08-31 ENCOUNTER — Ambulatory Visit: Payer: 59 | Admitting: Rheumatology

## 2022-09-27 ENCOUNTER — Ambulatory Visit: Payer: 59 | Admitting: Rheumatology

## 2022-12-11 LAB — LAB REPORT - SCANNED
A1c: 5.4
EGFR (Non-African Amer.): 97
TSH W/REFLEX TO FT4: 1.19

## 2022-12-11 LAB — BASIC METABOLIC PANEL WITH GFR
BUN: 17 (ref 4–21)
CO2: 30 — AB (ref 13–22)
Chloride: 98 — AB (ref 99–108)
Creatinine: 0.7 (ref 0.5–1.1)
Glucose: 71
Potassium: 4 meq/L (ref 3.5–5.1)
Sodium: 137 (ref 137–147)

## 2022-12-11 LAB — LIPID PANEL
Cholesterol: 265 — AB (ref 0–200)
HDL: 59 (ref 35–70)
LDL Cholesterol: 173
LDl/HDL Ratio: 2.9
Triglycerides: 163 — AB (ref 40–160)

## 2022-12-11 LAB — HEPATIC FUNCTION PANEL
ALT: 39 U/L — AB (ref 7–35)
AST: 18 (ref 13–35)
Alkaline Phosphatase: 51 (ref 25–125)
Bilirubin, Total: 0.8

## 2022-12-11 LAB — CBC AND DIFFERENTIAL
HCT: 46 (ref 36–46)
Hemoglobin: 15.7 (ref 12.0–16.0)
Platelets: 301 10*3/uL (ref 150–400)
WBC: 7.5

## 2022-12-11 LAB — HEMOGLOBIN A1C: Hemoglobin A1C: 5.4

## 2022-12-11 LAB — CBC: RBC: 5.15 — AB (ref 3.87–5.11)

## 2022-12-11 LAB — COMPREHENSIVE METABOLIC PANEL WITH GFR
Albumin: 4.2 (ref 3.5–5.0)
Calcium: 9 (ref 8.7–10.7)
Globulin: 2.8

## 2022-12-11 LAB — TSH: TSH: 1.19 (ref 0.41–5.90)

## 2022-12-18 ENCOUNTER — Encounter: Payer: 59 | Admitting: Family Medicine

## 2023-02-14 ENCOUNTER — Other Ambulatory Visit (HOSPITAL_BASED_OUTPATIENT_CLINIC_OR_DEPARTMENT_OTHER): Payer: Self-pay | Admitting: Family Medicine

## 2023-02-14 DIAGNOSIS — Z1231 Encounter for screening mammogram for malignant neoplasm of breast: Secondary | ICD-10-CM

## 2023-03-13 ENCOUNTER — Encounter (HOSPITAL_BASED_OUTPATIENT_CLINIC_OR_DEPARTMENT_OTHER): Payer: Self-pay

## 2023-03-13 ENCOUNTER — Ambulatory Visit (HOSPITAL_BASED_OUTPATIENT_CLINIC_OR_DEPARTMENT_OTHER)
Admission: RE | Admit: 2023-03-13 | Discharge: 2023-03-13 | Disposition: A | Discharge status: Home / Self Care | Payer: 59 | Source: Ambulatory Visit | Attending: Family Medicine | Admitting: Family Medicine

## 2023-03-13 DIAGNOSIS — Z1231 Encounter for screening mammogram for malignant neoplasm of breast: Secondary | ICD-10-CM | POA: Diagnosis present

## 2023-03-16 ENCOUNTER — Other Ambulatory Visit: Payer: Self-pay | Admitting: Family Medicine

## 2023-03-16 DIAGNOSIS — R928 Other abnormal and inconclusive findings on diagnostic imaging of breast: Secondary | ICD-10-CM

## 2023-03-22 ENCOUNTER — Ambulatory Visit
Admission: RE | Admit: 2023-03-22 | Discharge: 2023-03-22 | Disposition: A | Discharge status: Home / Self Care | Payer: 59 | Source: Ambulatory Visit | Attending: Family Medicine | Admitting: Family Medicine

## 2023-03-22 DIAGNOSIS — R928 Other abnormal and inconclusive findings on diagnostic imaging of breast: Secondary | ICD-10-CM

## 2023-07-03 ENCOUNTER — Ambulatory Visit: Payer: 59 | Admitting: Neurology

## 2023-07-03 ENCOUNTER — Encounter: Payer: Self-pay | Admitting: Neurology

## 2023-07-03 VITALS — BP 127/70 | HR 95 | Ht 60.0 in | Wt 134.6 lb

## 2023-07-03 DIAGNOSIS — G43709 Chronic migraine without aura, not intractable, without status migrainosus: Secondary | ICD-10-CM | POA: Diagnosis not present

## 2023-07-03 MED ORDER — PROPRANOLOL HCL 10 MG PO TABS: 10.0000 mg | ORAL_TABLET | Freq: Two times a day (BID) | ORAL | 11 refills | Status: DC

## 2023-07-03 NOTE — Patient Instructions (Signed)
Try propranolol is a good migraine preventative and is used in exertional headaches however can decrease blood pressure and pulse so monitor for symptoms such as dizziness, chest pain, fatigue  Next I would try Ajovy or Emgality for prevention  After that we could consider botox for migraines. We cannot put botox in the masseters but the btox around the head and in the shoulders could be very beneficial for migraines and the neck pain/tightness.   OnabotulinumtoxinA Injection (Medical Use) What is this medication? ONABOTULINUMTOXINA (o na BOTT you lye num tox in eh) treats severe muscle spasms. It may also be used to prevent migraine headaches. It can treat excessive sweating when other medications do not work well enough. This medicine may be used for other purposes; ask your health care provider or pharmacist if you have questions. COMMON BRAND NAME(S): Botox What should I tell my care team before I take this medication? They need to know if you have any of these conditions: Breathing problems Cerebral palsy spasms Difficulty urinating Heart problems History of surgery where this medication is going to be used Infection at the site where this medication is going to be used Myasthenia gravis or other neurologic disease Nerve or muscle disease Surgery plans Take medications that treat or prevent blood clots Thyroid problems An unusual or allergic reaction to botulinum toxin, albumin, other medications, foods, dyes, or preservatives Pregnant or trying to get pregnant Breast-feeding How should I use this medication? This medication is for injected into a muscle. It is given by your care team in a hospital or clinic setting. A special MedGuide will be given to you before each treatment. Be sure to read this information carefully each time. Talk to your care team about the use of this medication in children. While this medication may be prescribed for children as young as 2 years for  selected conditions, precautions do apply. Overdosage: If you think you have taken too much of this medicine contact a poison control center or emergency room at once. NOTE: This medicine is only for you. Do not share this medicine with others. What if I miss a dose? This does not apply. What may interact with this medication? Aminoglycoside antibiotics, such as gentamicin, neomycin, tobramycin Muscle relaxants Other botulinum toxin injections This list may not describe all possible interactions. Give your health care provider a list of all the medicines, herbs, non-prescription drugs, or dietary supplements you use. Also tell them if you smoke, drink alcohol, or use illegal drugs. Some items may interact with your medicine. What should I watch for while using this medication? Visit your care team for regular check ups. This medication will cause weakness in the muscle where it is injected. Tell your care team if you feel unusually weak in other muscles. Get medical help right away if you have problems with breathing, swallowing, or talking. This medication might make your eyelids droop or make you see blurry or double. If you have weak muscles or trouble seeing do not drive a car, use machinery, or do other dangerous activities. This medication contains albumin from human blood. It may be possible to pass an infection in this medication, but no cases have been reported. Talk to your care team about the risks and benefits of this medication. If your activities have been limited by your condition, go back to your regular routine slowly after treatment with this medication. What side effects may I notice from receiving this medication? Side effects that you should report to your  care team as soon as possible: Allergic reactions--skin rash, itching, hives, swelling of the face, lips, tongue, or throat Dryness or irritation of the eyes, eye pain, change in vision, sensitivity to  light Infection--fever, chills, cough, sore throat, wounds that don't heal, pain or trouble when passing urine, general feeling of discomfort or being unwell Spread of botulinum toxin effects--unusual weakness or fatigue, blurry or double vision, trouble swallowing, hoarseness or trouble speaking, trouble breathing, loss of bladder control Trouble passing urine Side effects that usually do not require medical attention (report these to your care team if they continue or are bothersome): Dry mouth Eyelid drooping Fatigue Headache Pain, redness, or irritation at injection site This list may not describe all possible side effects. Call your doctor for medical advice about side effects. You may report side effects to FDA at 1-800-FDA-1088. Where should I keep my medication? This medication is given in a hospital or clinic and will not be stored at home. NOTE: This sheet is a summary. It may not cover all possible information. If you have questions about this medicine, talk to your doctor, pharmacist, or health care provider.  2024 Elsevier/Gold Standard (2021-09-08 00:00:00) Galcanezumab Injection What is this medication? GALCANEZUMAB (gal ka NEZ ue mab) prevents migraines. It works by blocking a substance in the body that causes migraines. It may also be used to treat cluster headaches. It is a monoclonal antibody. This medicine may be used for other purposes; ask your health care provider or pharmacist if you have questions. COMMON BRAND NAME(S): Emgality What should I tell my care team before I take this medication? They need to know if you have any of these conditions: An unusual or allergic reaction to galcanezumab, other medications, foods, dyes, or preservatives Pregnant or trying to get pregnant Breast-feeding How should I use this medication? This medication is injected under the skin. You will be taught how to prepare and give it. Take it as directed on the prescription label. Keep  taking it unless your care team tells you to stop. It is important that you put your used needles and syringes in a special sharps container. Do not put them in a trash can. If you do not have a sharps container, call your pharmacist or care team to get one. Talk to your care team about the use of this medication in children. Special care may be needed. Overdosage: If you think you have taken too much of this medicine contact a poison control center or emergency room at once. NOTE: This medicine is only for you. Do not share this medicine with others. What if I miss a dose? If you miss a dose, take it as soon as you can. If it is almost time for your next dose, take only that dose. Do not take double or extra doses. What may interact with this medication? Interactions are not expected. This list may not describe all possible interactions. Give your health care provider a list of all the medicines, herbs, non-prescription drugs, or dietary supplements you use. Also tell them if you smoke, drink alcohol, or use illegal drugs. Some items may interact with your medicine. What should I watch for while using this medication? Visit your care team for regular checks on your progress. Tell your care team if your symptoms do not start to get better or if they get worse. What side effects may I notice from receiving this medication? Side effects that you should report to your care team as soon as possible:  Allergic reactions or angioedema--skin rash, itching or hives, swelling of the face, eyes, lips, tongue, arms, or legs, trouble swallowing or breathing Side effects that usually do not require medical attention (report to your care team if they continue or are bothersome): Pain, redness, or irritation at injection site This list may not describe all possible side effects. Call your doctor for medical advice about side effects. You may report side effects to FDA at 1-800-FDA-1088. Where should I keep my  medication? Keep out of the reach of children and pets. Store in a refrigerator or at room temperature between 20 and 25 degrees C (68 and 77 degrees F). Refrigeration (preferred): Store in the refrigerator. Do not freeze. Keep in the original container until you are ready to take it. Remove the dose from the carton about 30 minutes before it is time for you to use it. If the dose is not used, it may be stored in original container at room temperature for 7 days. Get rid of any unused medication after the expiration date. Room Temperature: This medication may be stored at room temperature for up to 7 days. Keep it in the original container. Protect from light until time of use. If it is stored at room temperature, get rid of any unused medication after 7 days or after it expires, whichever is first. To get rid of medications that are no longer needed or have expired: Take the medication to a medication take-back program. Check with your pharmacy or law enforcement to find a location. If you cannot return the medication, ask your pharmacist or care team how to get rid of this medication safely. NOTE: This sheet is a summary. It may not cover all possible information. If you have questions about this medicine, talk to your doctor, pharmacist, or health care provider.  2024 Elsevier/Gold Standard (2021-11-07 00:00:00) Whitney Trujillo Injection What is this medication? FREMANEZUMAB (fre ma NEZ ue mab) prevents migraines. It works by blocking a substance in the body that causes migraines. It is a monoclonal antibody. This medicine may be used for other purposes; ask your health care provider or pharmacist if you have questions. COMMON BRAND NAME(S): AJOVY What should I tell my care team before I take this medication? They need to know if you have any of these conditions: An unusual or allergic reaction to fremanezumab, other medications, foods, dyes, or preservatives Pregnant or trying to get  pregnant Breast-feeding How should I use this medication? This medication is injected under the skin. You will be taught how to prepare and give it. Take it as directed on the prescription label. Keep taking it unless your care team tells you to stop. It is important that you put your used needles and syringes in a special sharps container. Do not put them in a trash can. If you do not have a sharps container, call your pharmacist or care team to get one. Talk to your care team about the use of this medication in children. Special care may be needed. Overdosage: If you think you have taken too much of this medicine contact a poison control center or emergency room at once. NOTE: This medicine is only for you. Do not share this medicine with others. What if I miss a dose? If you miss a dose, take it as soon as you can. If it is almost time for your next dose, take only that dose. Do not take double or extra doses. What may interact with this medication? Interactions are not expected.  This list may not describe all possible interactions. Give your health care provider a list of all the medicines, herbs, non-prescription drugs, or dietary supplements you use. Also tell them if you smoke, drink alcohol, or use illegal drugs. Some items may interact with your medicine. What should I watch for while using this medication? Tell your care team if your symptoms do not start to get better or if they get worse. What side effects may I notice from receiving this medication? Side effects that you should report to your care team as soon as possible: Allergic reactions or angioedema--skin rash, itching or hives, swelling of the face, eyes, lips, tongue, arms, or legs, trouble swallowing or breathing Side effects that usually do not require medical attention (report to your care team if they continue or are bothersome): Pain, redness, or irritation at injection site This list may not describe all possible side  effects. Call your doctor for medical advice about side effects. You may report side effects to FDA at 1-800-FDA-1088. Where should I keep my medication? Keep out of the reach of children and pets. Store in a refrigerator or at room temperature between 20 and 25 degrees C (68 and 77 degrees F). Refrigeration (preferred): Store in the refrigerator. Do not freeze. Keep in the original container until you are ready to take it. Remove the dose from the carton about 30 minutes before it is time for you to use it. If the dose is not used, it may be stored in the original container at room temperature for 7 days. Get rid of any unused medication after the expiration date. Room Temperature: This medication may be stored at room temperature for up to 7 days. Keep it in the original container. Protect from light until time of use. If it is stored at room temperature, get rid of any unused medication after 7 days or after it expires, whichever is first. To get rid of medications that are no longer needed or have expired: Take the medication to a medication take-back program. Check with your pharmacy or law enforcement to find a location. If you cannot return the medication, ask your pharmacist or care team how to get rid of this medication safely. NOTE: This sheet is a summary. It may not cover all possible information. If you have questions about this medicine, talk to your doctor, pharmacist, or health care provider.  2024 Elsevier/Gold Standard (2021-11-04 00:00:00) Propranolol Tablets What is this medication? PROPRANOLOL (proe PRAN oh lole) treats many conditions such as high blood pressure, tremors, and a type of arrhythmia known as AFib (atrial fibrillation). It works by lowering your blood pressure and heart rate, making it easier for your heart to pump blood to the rest of your body. It may be used to prevent migraine headaches. It works by relaxing the blood vessels in the brain that cause migraines. It  belongs to a group of medications called beta blockers. This medicine may be used for other purposes; ask your health care provider or pharmacist if you have questions. COMMON BRAND NAME(S): Inderal What should I tell my care team before I take this medication? They need to know if you have any of these conditions: Diabetes Having surgery Heart or blood vessel conditions, such as slow heartbeat, heart failure, heart block Kidney disease Liver disease Lung or breathing disease, such as asthma or COPD Myasthenia gravis Pheochromocytoma Thyroid disease An unusual or allergic reaction to propranolol, other medications, foods, dyes, or preservatives Pregnant or trying to get  pregnant Breastfeeding How should I use this medication? Take this medication by mouth. Take it as directed on the prescription label at the same time every day. Keep taking it unless your care team tells you to stop. Talk to your care team about the use of this medication in children. Special care may be needed. Overdosage: If you think you have taken too much of this medicine contact a poison control center or emergency room at once. NOTE: This medicine is only for you. Do not share this medicine with others. What if I miss a dose? If you miss a dose, take it as soon as you can. If it is almost time for your next dose, take only that dose. Do not take double or extra doses. What may interact with this medication? Do not take this medication with any of the following: Thioridazine This medication may also interact with the following: Certain medications for blood pressure, heart disease, irregular heartbeat Epinephrine NSAIDs, medications for pain and inflammation, such as ibuprofen or naproxen Warfarin Other medications may affect the way this medication works. Talk with your care team about all of the medications you take. They may suggest changes to your treatment plan to lower the risk of side effects and to make  sure your medications work as intended. This list may not describe all possible interactions. Give your health care provider a list of all the medicines, herbs, non-prescription drugs, or dietary supplements you use. Also tell them if you smoke, drink alcohol, or use illegal drugs. Some items may interact with your medicine. What should I watch for while using this medication? Visit your care team for regular checks on your progress. Check your blood pressure as directed. Know what your blood pressure should be and when to contact your care team. This medication may affect your coordination, reaction time, or judgment. Do not drive or operate machinery until you know how this medication affects you. Sit up or stand slowly to reduce the risk of dizzy or fainting spells. Drinking alcohol with this medication can increase the risk of these side effects. Do not suddenly stop taking this medication. This may increase your risk of side effects, such as chest pain and heart attack. If you no longer need to take this medication, your care team will lower the dose slowly over time to decrease the risk of side effects. If you are going to need surgery or a procedure, tell your care team that you are using this medication. This medication may affect blood glucose levels. It can also mask the symptoms of low blood sugar, such as a rapid heartbeat and tremors. If you have diabetes, it is important to check your blood sugar often while you are taking this medication. Do not treat yourself for coughs, colds, or pain while you are using this medication without asking your care team for advice. Some medications may increase your blood pressure. What side effects may I notice from receiving this medication? Side effects that you should report to your care team as soon as possible: Allergic reactions--skin rash, itching, hives, swelling of the face, lips, tongue, or throat Heart failure--shortness of breath, swelling of  the ankles, feet, or hands, sudden weight gain, unusual weakness or fatigue Low blood pressure--dizziness, feeling faint or lightheaded, blurry vision Raynaud's--cool, numb, or painful fingers or toes that may change color from pale, to blue, to red Redness, blistering, peeling, or loosening of the skin, including inside the mouth Slow heartbeat--dizziness, feeling faint or lightheaded, confusion,  trouble breathing, unusual weakness or fatigue Worsening mood, feelings of depression Side effects that usually do not require medical attention (report to your care team if they continue or are bothersome): Change in sex drive or performance Diarrhea Dizziness Fatigue Headache This list may not describe all possible side effects. Call your doctor for medical advice about side effects. You may report side effects to FDA at 1-800-FDA-1088. Where should I keep my medication? Keep out of the reach of children and pets. Store at room temperature between 20 and 25 degrees C (68 and 77 degrees F). Protect from light. Throw away any unused medication after the expiration date. NOTE: This sheet is a summary. It may not cover all possible information. If you have questions about this medicine, talk to your doctor, pharmacist, or health care provider.  2024 Elsevier/Gold Standard (2022-09-11 00:00:00)

## 2023-07-03 NOTE — Progress Notes (Addendum)
[facility]    Provider:  Dr Lucia Gaskins Requesting Provider: Emergency room Primary Care Provider:  Irena Reichmann, DO  CC: Migraines and headaches   07/03/2023: Here for headaches, we have seen her in the past for other multiple concerns. She is still having headaches. She has had headaches all her life, she has taken different medications in the past for her migraines she has had them for a very long time she will get them after an orgasm and it is painful and intense and she took her blood pressure afterwards and it was 170s. Today was 127/87 today. Usually her blood pressure is very low. She will have 8-10 migraine days a month and > 15 total headache days a month. Ongoing for over a year and she has been treated with multiple medications, she also says she has fibromyalgia, she grinds her teeth and clench, she will break teeth and got invisilign, the dentist is going to consider botox in the masseters for her discussed we cannot do that.   From a review of records, medications tried that can be used in migraine or headache management include: Tylenol, Celebrex, Flexeril, Cymbalta, Lexapro, Prozac, gabapentin, ibuprofen, Ativan, meloxicam, Robaxin, Zofran, Maxalt, imitrex, tramadol, Effexor, amitriptyline and nortriptyline and the tricyclic antidepressant class is contraindicated as patient is already on an SSRI and the combination can cause serotonergic syndrome. Aimovig is contraindicated due to constipation. Propranolol caused shortness of breath, medication discontinued due to side effects.  Patient complains of symptoms per HPI as well as the following symptoms: neck pain . Pertinent negatives and positives per HPI. All others negative    Follow up 02/13/2022: She comes with a list today of concerns. She has a lot of tightness in the neck, she was in the emergency room last weekend she saw a rheumatologist, she wakes up stiff every morning, random muscles, it moves around,  recently her calves and the neck is better.  So symptoms started in May 2021 after COVID, from then on neck pain and back pain for which we have seen patient for in the past in January 2023 started scuffing or tripping on the left foot, heaviness in the left leg, in March fell had whiplash numbness in the face now week heavy feeling leg and some decreased range of motion, 2 weeks ago started feeli feeling tingling and numbness in the left leg, torso, face throat arm chest cramping and muscle spasms with numbness and tingling, randomly occurs in different parts of the body and times, hands feet get cold randomly at time of rash. Te rsah is across the front of the face, she has rashes on her body, today she appears as though she has a rash Maller rash and rash on the forehead, she shows me some pictures where she has several rashes on the abdomen on the forehead on the neck in different places,she saw a rheumatologist, just did not feel as if she had psoriatic arthritis that it might of been eczema, started Dupixent, the rash improved but now has more of a rash on her face, wakes up stiff, has had an MRI of the brain and the cervical spine right, EMG of the upper extremities. Stiffness, joint pain, rashes, muscle spasms, no weakness, cold and hot extremities can be left or right, no numbness or tingling currently (has ulnar neuropathy. Also family history in mother of these symptoms.   Patient complains of symptoms per HPI as well as the following symptoms: rashes, joint pain . Pertinent negatives and  positives per HPI. All others negative   HPI:  Ercilia Bettinger is a 47 y.o. female here as requested by emergency room for headaches.  Past medical history migraines, hyperlipidemia, depression, anxiety, ADD.  I reviewed emergency room notes, patient had sudden onset headache during intercourse March 08, 2020 when she was seen in the emergency room it had improved, similar to previous migraine but more severe,  has had post ictal headaches in the past but never this severe, neurologic exam is nonfocal, CTA was reassuring without aneurysm, she declined lumbar puncture, her headache improved and she wished to go home, there was no evidence of aneurysm or subarachnoid hemorrhage but she was warned that about 1% of the time its not seen on CAT scan she still declined lumbar puncture.  She described it as a severe generalized headache and pain into her neck, not worsening on movement, no vision changes, vomiting or confusion, no numbness weakness or tingling into her arms or legs, she works as a Interior and spatial designer, she has a history of blood clots, Tylenol and migraine medication did not help and the pain was 9 out of 10.  She has had headache at orgasm 4 times, this last one was very severe 03/07/2020, 9/10 pain, CTA was negative and she declined LP, since then she saw an orthopaedist because her neck and back always hurt she had an MRI cervical spine and was started on Gabapentin which has helped her headaches. She was sent to Rheumatology for joint pain to evaluate for RF or other but hasn;t gone yet (she has psoriasis), She has a history of migraines especially with her cycles, pusating/pounding, light and sound sensitivity, nausea, vomiting, has had them many years stable 2x a month, maxalt heps with the migraines may have to take it twice, taking gabapentin 1-2x a day and has helped with the migraines, no exertional headaches since June. 2 migraines a month and rarely coital headache. She has had a few sharp pains in the morning when she is standing and getting ready and she leans over a little bit, positional. When the headache happens during sex it starts in the back of the head and radiates and shoots up the head of the back of the eye.No other focal neurologic deficits, associated symptoms, inciting events or modifiable factors.    Reviewed notes, labs and imaging from outside physicians, which showed:  Personally  revewed images and agree with the following  Brain: There is no mass, hemorrhage or extra-axial collection. The size and configuration of the ventricles and extra-axial CSF spaces are normal. There is no acute or chronic infarction. The brain parenchyma is normal.   Skull: The visualized skull base, calvarium and extracranial soft tissues are normal.   Sinuses/Orbits: No fluid levels or advanced mucosal thickening of the visualized paranasal sinuses. No mastoid or middle ear effusion. The orbits are normal.   CTA NECK FINDINGS   SKELETON: There is no bony spinal canal stenosis. No lytic or blastic lesion.   OTHER NECK: Normal pharynx, larynx and major salivary glands. No cervical lymphadenopathy. Unremarkable thyroid gland.   UPPER CHEST: No pneumothorax or pleural effusion. No nodules or masses.   AORTIC ARCH:   There is no calcific atherosclerosis of the aortic arch. There is no aneurysm, dissection or hemodynamically significant stenosis of the visualized portion of the aorta. Conventional 3 vessel aortic branching pattern. The visualized proximal subclavian arteries are widely patent.   RIGHT CAROTID SYSTEM: Normal without aneurysm, dissection or stenosis.   LEFT  CAROTID SYSTEM: Normal without aneurysm, dissection or stenosis.   VERTEBRAL ARTERIES: Left dominant configuration. Both origins are clearly patent. There is no dissection, occlusion or flow-limiting stenosis to the skull base (V1-V3 segments).   CTA HEAD FINDINGS   POSTERIOR CIRCULATION:   --Vertebral arteries: Normal V4 segments.   --Inferior cerebellar arteries: Normal.   --Basilar artery: Normal.   --Superior cerebellar arteries: Normal.   --Posterior cerebral arteries (PCA): Normal.   ANTERIOR CIRCULATION:   --Intracranial internal carotid arteries: Normal.   --Anterior cerebral arteries (ACA): Normal. Both A1 segments are present. Patent anterior communicating artery (a-comm).    --Middle cerebral arteries (MCA): Normal.   VENOUS SINUSES: As permitted by contrast timing, patent.   ANATOMIC VARIANTS: None   Review of the MIP images confirms the above findings.   IMPRESSION: Normal CTA of the head and neck.  Reviewed MRI of the cervical spine report April 12, 2020 which showed severe left C2-C3 neural foraminal narrowing, impinging upon the exiting left C3 nerve roots, due to severe left uncovertebral osteophytosis and severe left facet arthrosis, moderate right C7-T1 neuroforaminal narrowing due to severe right facet arthrosis, mild left C5-C6 neural foraminal narrowing, partially visualized moderate broad central T3-T4 disc osteophyte complex with mild/moderate deformity of central cord without spinal canal stenosis, straightening of the normal cervical lordosis.   Review of Systems: Patient complains of symptoms per HPI as well as the following symptoms: Headache, neck pain, shoulder pain, paresthesias. Pertinent negatives and positives per HPI. All others negative.   Social History   Socioeconomic History   Marital status: Married    Spouse name: Not on file   Number of children: Not on file   Years of education: Not on file   Highest education level: Not on file  Occupational History   Not on file  Tobacco Use   Smoking status: Never   Smokeless tobacco: Never  Vaping Use   Vaping status: Never Used  Substance and Sexual Activity   Alcohol use: Yes    Comment: Socially   Drug use: Never   Sexual activity: Yes    Partners: Male    Birth control/protection: Surgical    Comment: lives with husband (ADD) and children, 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  Other Topics Concern   Not on file  Social History Narrative   Lives at home with husband and 2 children   Right handed   Caffeine: 2-3 cups/day   Social Determinants of Health   Financial Resource Strain: Not on file  Food Insecurity: Not on file  Transportation Needs: Not on  file  Physical Activity: Not on file  Stress: Not on file  Social Connections: Not on file  Intimate Partner Violence: Not on file    Family History  Problem Relation Age of Onset   Diabetes Mother        2, diet controlled   Colon cancer Father        age 2   Cancer Father        Carcinoid Syndrome, colon   Headache Father    Ovarian cancer Maternal Aunt    Heart disease Maternal Grandmother    Stroke Paternal Grandmother    Dementia Paternal Grandmother    COPD Paternal Grandfather        black lun, Forensic scientist, tobacco   ADD / ADHD Son    Colon polyps Neg Hx    Esophageal cancer Neg Hx    Stomach cancer Neg Hx  Rectal cancer Neg Hx     Past Medical History:  Diagnosis Date   Acute pulmonary embolism  5 wks post hysterectomy 07/07/2014   Acute right lower quadrant pain 01/25/2016   ADD (attention deficit disorder) 02/02/2015   Allergy    seasonal allergies   Anxiety    Arthritis    Colon cancer screening 12/16/2015   Depression with anxiety 03/15/2016   Endometriosis    Fibromyalgia    Flank pain 05/03/2021   Gallstones    Gross hematuria 05/03/2021   History of COVID-19 02/24/2020   Hyperlipidemia, mixed 02/02/2015   Migraine 12/16/2015   Osteoarthritis    Preventative health care 12/16/2015   Pulmonary emboli (HCC)    b/l PE after hysterectomy, 5 weeks post op, clotting problem ruled out at [facility]    Patient Active Problem List   Diagnosis Date Noted   Rash 02/14/2022   Muscle stiffness 02/14/2022   Arthralgia 02/14/2022   Cervical radiculopathy 02/14/2022   Lumbar radiculopathy 02/14/2022   Left-sided weakness 02/03/2022   Eczema 12/17/2021   Psoriasis 11/09/2020   Arthritis 07/05/2020   Bilateral occipital neuralgia 05/04/2020   Exertional headache 05/04/2020   Chronic migraine without aura without status migrainosus, not intractable 05/04/2020   Bilateral carpal tunnel syndrome 10/19/2017   Hyperlipidemia, mixed 02/02/2015   Vitamin D  deficiency 02/02/2015   ADD (attention deficit disorder) 02/02/2015   Allergy    Endometriosis 07/07/2014    Past Surgical History:  Procedure Laterality Date   ABDOMINAL HYSTERECTOMY  2010   1- ovary then second ovary ovary later   CESAREAN SECTION  2007, 2009   CHOLECYSTECTOMY  2015   CYSTOSCOPY  10/2014   EYE SURGERY     lasik b/l   LAPAROTOMY     Endometriosis   OOPHORECTOMY  2013   UMBILICAL HERNIA REPAIR  2019   also epigastric hernia removed at the same time per pt   WISDOM TOOTH EXTRACTION      Current Outpatient Medications  Medication Sig Dispense Refill   celecoxib (CELEBREX) 200 MG capsule 1 capsule with food     DULoxetine (CYMBALTA) 20 MG capsule Take 60 mg by mouth daily.     Dupilumab (DUPIXENT) 300 MG/2ML SOPN Inject 300 mg into the skin every 14 (fourteen) days. 3.92 mL    estradiol (VIVELLE-DOT) 0.025 MG/24HR Place 1 patch onto the skin 2 (two) times a week.     gabapentin (NEURONTIN) 100 MG capsule Take 1 capsule (100 mg total) by mouth 3 (three) times daily as needed (pain). 20 capsule 0   methocarbamol (ROBAXIN) 500 MG tablet Take 500 mg by mouth 2 (two) times daily as needed for muscle spasms.     OVER THE COUNTER MEDICATION Take 1 Dose by mouth daily at 8 pm. 3 tsp powder consisting of: Magnesium carbonate 320 mg, vitamin C 265 mg, vitamin D 3 mcg, calcium 210 mg, potassium 110 mg     propranolol (INDERAL) 10 MG tablet Take 1 tablet (10 mg total) by mouth 2 (two) times daily. 60 tablet 11   Triamcinolone Acetonide (NASACORT AQ NA) Place 1 spray into both nostrils daily as needed (allergies).     triamcinolone ointment (KENALOG) 0.1 % Apply 1 application. topically 2 (two) times daily as needed (rash).     Turmeric 500 MG CAPS See admin instructions.     Current Facility-Administered Medications  Medication Dose Route Frequency Provider Last Rate Last Admin   0.9 %  sodium chloride infusion  500 mL Intravenous Once Beverley Fiedler, MD        Allergies  as of 07/03/2023 - Review Complete 07/03/2023  Allergen Reaction Noted   Latex Itching 03/13/2013   Gluten meal Other (See Comments) 03/13/2013    Vitals: BP 127/70   Pulse 95   Ht 5' (1.524 m)   Wt 134 lb 9.6 oz (61.1 kg)   BMI 26.29 kg/m  Last Weight:  Wt Readings from Last 1 Encounters:  07/03/23 134 lb 9.6 oz (61.1 kg)   Last Height:   Ht Readings from Last 1 Encounters:  07/03/23 5' (1.524 m)   Physical exam: Exam: Gen: NAD, conversant, well nourised, well groomed                     CV: RRR, no MRG. No Carotid Bruits. No peripheral edema, warm, nontender Eyes: Conjunctivae clear without exudates or hemorrhage  Neuro: Detailed Neurologic Exam  Speech:    Speech is normal; fluent and spontaneous with normal comprehension.  Cognition:    The patient is oriented to person, place, and time;     recent and remote memory intact;     language fluent;     normal attention, concentration,     fund of knowledge Cranial Nerves:    The pupils are equal, round, and reactive to light. ONH without edemaVisual fields are full. Extraocular movements are intact. Trigeminal sensation is intact and the muscles of mastication are normal. The face is symmetric. The palate elevates in the midline. Hearing intact. Voice is normal. Shoulder shrug is normal. The tongue has normal motion without fasciculations.   Coordination: nml  Gait: nml  Motor Observation:    No asymmetry, no atrophy, and no involuntary movements noted. Tone:    Normal muscle tone.    Posture:    Posture is normal. normal erect    Strength:    Strength is symmetrical in the upper and lower limbs.      Sensation: intact to LT     Reflex Exam:  DTR's:    Deep tendon reflexes in the upper and lower extremities are symmetrical bilaterally.   Toes:    The toes are downgoing bilaterally.   Clonus:    Clonus is absent.     Assessment/Plan:  47year old here for migraines and headaches, sometimes  exertional; chronic migraines  Try propranolol is a good migraine preventative and is used in exertional headaches however can decrease blood pressure and pulse so monitor for symptoms such as dizziness, chest pain, fatigue  Next I would try Ajovy or Emgality for prevention  After that we could consider botox for migraines. We cannot put botox in the masseters but the botox around the head and in the shoulders could be very beneficial for migraines and the neck pain/tightness.   She will have 8-10 migraine days a month and > 15 total headache days a month. Ongoing for over a year and she has been treated with multiple medications, she also says she has fibromyalgia, she grinds her teeth and clench, she will break teeth and got invisilign, the dentist is going to consider botox in the masseters.  From a review of records, medications tried that can be used in migraine or headache management include: Tylenol, Celebrex, Flexeril, Cymbalta, Lexapro, Prozac, gabapentin, ibuprofen, Ativan, meloxicam, Robaxin, Zofran, Maxalt, imitrex, tramadol, Effexor, amitriptyline and nortriptyline and the tricyclic antidepressant class is contraindicated as patient is already on an SSRI and the combination can cause  serotonergic syndrome. Aimovig is contraindicated due to constipation. Propranolol caused shortness of breath, medication discontinued due to side effects.  02/03/2022: MRI of the entire neuro axis, reviewed images MRI brain(normal),  MRI Cervical: Multifactorial degenerative changes with resultant multilevel foraminal narrowing as above. Notable findings include severe left C3 foraminal stenosis, moderate bilateral C4 foraminal narrowing, with mild left C5 and C6 foraminal stenosis MRI thoracic : normal spinal cord. Small disc protrusions involving the T3-4 through T8-9  MRI lumbar spine: Small central disc protrusion with annular fissure at L5-S1, closely approximating the left greater than right descending  S1 nerve roots without frank impingement  Mild prox left arm weakness and distal leg flex/ex weakness: Given MRI c-spine  and l-spine with degenerative disease this is likely be due to radiculopathy. MRI brain was neg for acute event/stroke. Mri of entire neuro axis with normal spinal cord.  Exertional headaches: Appears to be occipital nerve involvement several times during coitus she has had shooting pain coming up in the back of her head to the top, severe, burning, brief, she has C2-C3 degenerative changes with severe foraminal stenosis so this is likely contributing.  Prior CTA of the head and neck as well as CT of the head were normal without aneurysm.  MRI of the cervical spine showed normal cord but multilevel degenerative changes worse at C2-C3 explaining the occipital neuralgic pain.   Meds ordered this encounter  Medications   propranolol (INDERAL) 10 MG tablet    Sig: Take 1 tablet (10 mg total) by mouth 2 (two) times daily.    Dispense:  60 tablet    Refill:  11    Cc:  Irena Reichmann, DO  Naomie Dean, MD  [facility] [address] [place], [place] [address]  Phone [phone] Fax [phone]  I spent over 40  minutes of face-to-face and non-face-to-face time with patient on the  1. Chronic migraine without aura without status migrainosus, not intractable      diagnosis.  This included previsit chart review, lab review, study review, order entry, electronic health record documentation, patient education on the different diagnostic and therapeutic options, counseling and coordination of care, risks and benefits of management, compliance, or risk factor reduction. This also includes imaging review of additional 15 minutes outside of appointment time.

## 2023-07-04 ENCOUNTER — Encounter: Payer: Self-pay | Admitting: Neurology

## 2023-07-06 ENCOUNTER — Encounter: Payer: Self-pay | Admitting: Neurology

## 2023-07-09 MED ORDER — AJOVY 225 MG/1.5ML ~~LOC~~ SOAJ: 225.0000 mg | SUBCUTANEOUS | 5 refills | Status: DC

## 2023-07-09 NOTE — Telephone Encounter (Addendum)
From last office visit with Dr Lucia Gaskins:  "Try propranolol is a good migraine preventative and is used in exertional headaches however can decrease blood pressure and pulse so monitor for symptoms such as dizziness, chest pain, fatigue   Next I would try Ajovy or Emgality for prevention   After that we could consider botox for migraines. We cannot put botox in the masseters but the btox around the head and in the shoulders could be very beneficial for migraines and the neck pain/tightness."

## 2023-07-09 NOTE — Addendum Note (Signed)
Addended by: Bertram Savin on: 07/09/2023 03:38 PM   Modules accepted: Orders

## 2023-07-10 MED ORDER — EMGALITY 120 MG/ML ~~LOC~~ SOAJ: 240.0000 mg | Freq: Once | SUBCUTANEOUS | 0 refills | Status: DC

## 2023-07-10 NOTE — Addendum Note (Signed)
Addended by: Bertram Savin on: 07/10/2023 07:41 AM   Modules accepted: Orders

## 2023-07-16 ENCOUNTER — Telehealth: Payer: Self-pay | Admitting: *Deleted

## 2023-07-16 NOTE — Telephone Encounter (Signed)
Emgality PA needed. Pt isn't able to fill yet at pharmacy.

## 2023-07-16 NOTE — Telephone Encounter (Signed)
She does need the loading dose.

## 2023-07-17 ENCOUNTER — Telehealth: Payer: Self-pay

## 2023-07-17 ENCOUNTER — Other Ambulatory Visit (HOSPITAL_COMMUNITY): Payer: Self-pay

## 2023-07-17 NOTE — Telephone Encounter (Signed)
PA request has been Submitted. New Encounter created for follow up. For additional info see Pharmacy Prior Auth telephone encounter from 07/17/2023.

## 2023-07-17 NOTE — Telephone Encounter (Signed)
Pharmacy Patient Advocate Encounter   Received notification from Physician's Office that prior authorization for Emgality 120MG /ML auto-injectors (migraine) is required/requested.   Insurance verification completed.   The patient is insured through [facility] .   Per test claim: PA required; PA submitted to [facility] via CoverMyMeds Key/confirmation #/EOC [id number] Status is pending

## 2023-07-19 ENCOUNTER — Other Ambulatory Visit (HOSPITAL_COMMUNITY): Payer: Self-pay

## 2023-07-19 NOTE — Telephone Encounter (Signed)
Pharmacy Patient Advocate Encounter  Received notification from [facility] that Prior Authorization for Emgality 120MG /ML auto-injectors (migraine) has been APPROVED from 07/19/2023 to 08/01/2023. Ran test claim, Copay is $35.00. This test claim was processed through [facility]- copay amounts may vary at other pharmacies due to pharmacy/plan contracts, or as the patient moves through the different stages of their insurance plan.   PA #/Case ID/Reference #: [id number]

## 2023-07-19 NOTE — Telephone Encounter (Signed)
Thanks, I notified the patient.

## 2023-07-23 MED ORDER — EMGALITY 120 MG/ML ~~LOC~~ SOAJ: 120.0000 mg | SUBCUTANEOUS | 5 refills | Status: DC

## 2023-07-23 NOTE — Addendum Note (Signed)
Addended by: Bertram Savin on: 07/23/2023 08:00 AM   Modules accepted: Orders

## 2023-08-11 ENCOUNTER — Other Ambulatory Visit: Payer: Self-pay | Admitting: Neurology

## 2023-08-16 ENCOUNTER — Other Ambulatory Visit (HOSPITAL_COMMUNITY): Payer: Self-pay

## 2023-08-16 ENCOUNTER — Telehealth: Payer: Self-pay

## 2023-08-16 NOTE — Telephone Encounter (Signed)
LVM for pt to call back to discuss refill

## 2023-08-16 NOTE — Telephone Encounter (Signed)
Need PA for maintenance Emgality dose asap. She is having a positive response to the loading dose.

## 2023-08-16 NOTE — Telephone Encounter (Signed)
Pharmacy Patient Advocate Encounter  Received notification from [facility] that Prior Authorization for Emgality 120MG /ML auto-injectors (migraine) has been APPROVED from 08/16/2023 to 08/15/2024. Ran test claim, Copay is $35.00. This test claim was processed through [facility]- copay amounts may vary at other pharmacies due to pharmacy/plan contracts, or as the patient moves through the different stages of their insurance plan.   PA #/Case ID/Reference #: [id number]

## 2023-08-16 NOTE — Telephone Encounter (Signed)
PA request has been Submitted. New Encounter created for follow up. For additional info see Pharmacy Prior Auth telephone encounter from 08/16/2023.

## 2023-08-16 NOTE — Telephone Encounter (Signed)
Pharmacy Patient Advocate Encounter   Received notification from Physician's Office that prior authorization for Emgality 120MG /ML auto-injectors (migraine) is required/requested.   Insurance verification completed.   The patient is insured through [facility] .   Per test claim: PA required; PA submitted to above mentioned insurance via CoverMyMeds Key/confirmation #/EOC [id number] Status is pending

## 2023-08-16 NOTE — Telephone Encounter (Signed)
Wonderful, thank you

## 2023-11-08 ENCOUNTER — Encounter: Payer: Self-pay | Admitting: Neurology

## 2023-11-08 LAB — TSH: TSH: 1.66 (ref 0.41–5.90)

## 2023-11-15 ENCOUNTER — Telehealth: Payer: Self-pay | Admitting: Neurology

## 2023-11-15 NOTE — Telephone Encounter (Signed)
Botox was denied via pharmacy benefit, submitted via medical [facility] and received approval.  Auth#: [id number] (11/15/23-11/14/24)

## 2023-11-15 NOTE — Telephone Encounter (Signed)
Patient sen email, emgality caused severe constipation so cgrps contraindicated can we try to get botox approved for her please? She can go to an NP for botox

## 2023-11-15 NOTE — Telephone Encounter (Signed)
Submitted auth via CMM, status is pending. Key: [id number]  [id number] G43.709 Botox 200 units

## 2023-12-12 ENCOUNTER — Encounter: Payer: Self-pay | Admitting: Family Medicine

## 2023-12-12 ENCOUNTER — Ambulatory Visit: Payer: 59 | Admitting: Family Medicine

## 2023-12-12 VITALS — BP 122/59 | HR 94 | Ht 60.0 in | Wt 137.0 lb

## 2023-12-12 DIAGNOSIS — L309 Dermatitis, unspecified: Secondary | ICD-10-CM

## 2023-12-12 DIAGNOSIS — M1990 Unspecified osteoarthritis, unspecified site: Secondary | ICD-10-CM | POA: Diagnosis not present

## 2023-12-12 DIAGNOSIS — F988 Other specified behavioral and emotional disorders with onset usually occurring in childhood and adolescence: Secondary | ICD-10-CM | POA: Diagnosis not present

## 2023-12-12 NOTE — Assessment & Plan Note (Signed)
 Facet arthritis Chronic cervical and thoracic facet arthritis causing significant pain, affecting work. Insurance approved Botox for neck pain. Cymbalta increased to 60 mg daily for pain management - following with neurology. - Administer Botox injections on March 31st with specialist - Continue Cymbalta 60 mg daily.

## 2023-12-12 NOTE — Progress Notes (Signed)
 New Patient Office Visit  Subjective    Patient ID: Whitney Trujillo, female    DOB: [birth year]  Age: 48 y.o. MRN: [id number]  CC:  Chief Complaint  Patient presents with   Establish Care    HPI Whitney Trujillo presents to establish care. She was a patient here in the past, but spent the past few years getting primary care at [facility] (Dr. Irena Reichmann).   Discussed the use of AI scribe software for clinical note transcription with the patient, who gave verbal consent to proceed.  History of Present Illness Whitney Trujillo "Whitney Trujillo" is a 48 year old female who presents for re-establishing care and medication management.  She is re-establishing care after previously receiving treatment at [facility], where she was evaluated for potential autoimmune conditions. She lives in [place], which is more convenient for her current care.  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 She is scheduled to receive Botox injections for her neck on March 31st. Her current medications include Cymbalta 60 mg daily, taken as two 30 mg doses, which has been helpful. She also uses gabapentin and Robaxin as needed for her back pain, and takes turmeric daily. 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  She has eczema with occasional flares all over her body. She manages this condition with Dupixent, spaced out to every three to four weeks depending on her symptoms. She is sensitive to gluten and latex, which she avoids.  She has noticed an increase in careless mistakes while coding, which she attributes to her ADHD. She is currently trying supplements to manage this but is considering medication if the supplements do not help. She was most recently on Vyvanse  in 2018 - stopped because she felt like it made her less creative which she needed for career as a Producer, television/film/video.           Outpatient Encounter Medications as of 12/12/2023  Medication Sig   DULoxetine (CYMBALTA) 20 MG capsule Take 60 mg by mouth daily.   Dupilumab (DUPIXENT) 300 MG/2ML SOPN Inject 300 mg into the skin every 14 (fourteen) days.   estradiol (VIVELLE-DOT) 0.025 MG/24HR Place 1 patch onto the skin 2 (two) times a week.   gabapentin (NEURONTIN) 100 MG capsule Take 1 capsule (100 mg total) by mouth 3 (three) times daily as needed (pain).   methocarbamol (ROBAXIN) 500 MG tablet Take 500 mg by mouth 2 (two) times daily as needed for muscle spasms.   OVER THE COUNTER MEDICATION Take 1 Dose by mouth daily at 8 pm. 3 tsp powder consisting of: Magnesium carbonate 320 mg, vitamin C 265 mg, vitamin D 3 mcg, calcium 210 mg, potassium 110 mg   Triamcinolone Acetonide (NASACORT AQ NA) Place 1 spray into both nostrils daily as needed (allergies).   triamcinolone ointment (KENALOG) 0.1 % Apply 1 application. topically 2 (two) times daily as needed (rash).   Turmeric 500 MG CAPS See admin instructions.   [DISCONTINUED] celecoxib (CELEBREX) 200 MG capsule 1 capsule with food   [DISCONTINUED] Galcanezumab-gnlm (EMGALITY) 120 MG/ML SOAJ Inject 120 mg into the skin every 30 (thirty) days.   Facility-Administered Encounter Medications as of 12/12/2023  Medication   0.9 %  sodium chloride infusion    Past Medical History:  Diagnosis Date  Acute pulmonary embolism  5 wks post hysterectomy 07/07/2014   Acute right lower quadrant pain 01/25/2016   ADD (attention deficit disorder) 02/02/2015   Allergy    seasonal allergies   Anxiety    Arthritis    Bilateral occipital neuralgia 05/04/2020   Colon cancer screening 12/16/2015   Depression with anxiety 03/15/2016   Endometriosis    Endometriosis 07/07/2014   S/p TAH b/l SPO     Fibromyalgia    Flank pain 05/03/2021   Gallstones     Gross hematuria 05/03/2021   History of COVID-19 02/24/2020   Hyperlipidemia, mixed 02/02/2015   Left-sided weakness 02/03/2022   Migraine 12/16/2015   Osteoarthritis    Preventative health care 12/16/2015   Pulmonary emboli (HCC)    b/l PE after hysterectomy, 5 weeks post op, clotting problem ruled out at [facility]    Past Surgical History:  Procedure Laterality Date   ABDOMINAL HYSTERECTOMY  2010   1- ovary then second ovary ovary later   CESAREAN SECTION  2007, 2009   CHOLECYSTECTOMY  2015   CYSTOSCOPY  10/2014   EYE SURGERY     lasik b/l   HERNIA REPAIR     LAPAROTOMY     Endometriosis   OOPHORECTOMY  2013   TUBAL LIGATION     UMBILICAL HERNIA REPAIR  2019   also epigastric hernia removed at the same time per pt   WISDOM TOOTH EXTRACTION      Family History  Problem Relation Age of Onset   Diabetes Mother        2, diet controlled   Anxiety disorder Mother    Arthritis Mother    Depression Mother    Hyperlipidemia Mother    Hypertension Mother    Colon cancer Father        age 2   Cancer Father        Carcinoid Syndrome, colon   Headache Father    Early death Father    Ovarian cancer Maternal Aunt    Heart disease Maternal Grandmother    Stroke Paternal Grandmother    Dementia Paternal Grandmother    Varicose Veins Paternal Grandmother    COPD Paternal Grandfather        black lun, Forensic scientist, tobacco   ADD / ADHD Son    Colon polyps Neg Hx    Esophageal cancer Neg Hx    Stomach cancer Neg Hx    Rectal cancer Neg Hx     Social History   Socioeconomic History   Marital status: Married    Spouse name: Not on file   Number of children: Not on file   Years of education: Not on file   Highest education level: Associate degree: occupational, Scientist, product/process development, or vocational program  Occupational History   Not on file  Tobacco Use   Smoking status: Never   Smokeless tobacco: Never  Vaping Use   Vaping status: Never Used  Substance and Sexual Activity    Alcohol use: Yes    Comment: 1-2 month   Drug use: Never   Sexual activity: Yes    Partners: Male    Birth control/protection: Surgical    Comment: lives with husband (ADD) and children, 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  Other Topics Concern   Not on file  Social History Narrative   Lives at home with husband and 2 children   Right handed   Caffeine: 2-3 cups/day   Social Drivers of Dispensing optician  Resource Strain: Low Risk  (12/11/2023)   Overall Financial Resource Strain (CARDIA)    Difficulty of Paying Living Expenses: Not hard at all  Food Insecurity: No Food Insecurity (12/11/2023)   Hunger Vital Sign    Worried About Running Out of Food in the Last Year: Never true    Ran Out of Food in the Last Year: Never true  Transportation Needs: No Transportation Needs (12/11/2023)   PRAPARE - Administrator, Civil Service (Medical): No    Lack of Transportation (Non-Medical): No  Physical Activity: Insufficiently Active (12/11/2023)   Exercise Vital Sign    Days of Exercise per Week: 4 days    Minutes of Exercise per Session: 30 min  Stress: Stress Concern Present (12/11/2023)   [employer] of Occupational Health - Occupational Stress Questionnaire    Feeling of Stress : To some extent  Social Connections: Moderately Integrated (12/11/2023)   Social Connection and Isolation Panel [NHANES]    Frequency of Communication with Friends and Family: Once a week    Frequency of Social Gatherings with Friends and Family: Once a week    Attends Religious Services: More than 4 times per year    Active Member of [employer] or Organizations: Yes    Attends Banker Meetings: 1 to 4 times per year    Marital Status: Married  Catering manager Violence: Not on file    ROS All review of systems negative except what is listed in the HPI      Objective    BP (!) 122/59   Pulse 94   Ht 5' (1.524 m)   Wt 137 lb (62.1 kg)   SpO2 100%   BMI 26.76  kg/m   Physical Exam Vitals reviewed.  Constitutional:      General: She is not in acute distress.    Appearance: Normal appearance. She is normal weight. She is not ill-appearing.  Cardiovascular:     Rate and Rhythm: Normal rate and regular rhythm.     Heart sounds: Normal heart sounds.  Pulmonary:     Effort: Pulmonary effort is normal.     Breath sounds: Normal breath sounds.  Skin:    General: Skin is warm and dry.  Neurological:     General: No focal deficit present.     Mental Status: She is alert and oriented to person, place, and time. Mental status is at baseline.  Psychiatric:        Mood and Affect: Mood normal.        Behavior: Behavior normal.        Thought Content: Thought content normal.        Judgment: Judgment normal.         Assessment & Plan:   Problem List Items Addressed This Visit       Active Problems   ADD (attention deficit disorder) - Primary   Increased careless mistakes attributed to ADHD. Currently using supplements, considering medication if ineffective. Previously used Vyvanse in 2018. - Monitor response to supplements for ADHD symptoms. - Consider reinitiating Vyvanse if symptoms persist and supplements are ineffective.      Arthritis   Facet arthritis Chronic cervical and thoracic facet arthritis causing significant pain, affecting work. Insurance approved Botox for neck pain. Cymbalta increased to 60 mg daily for pain management - following with neurology. - Administer Botox injections on March 31st with specialist - Continue Cymbalta 60 mg daily.       Eczema   -  Continue Dupixent injections every three to four weeks as needed based on symptoms.       Return if symptoms worsen or fail to improve.   Clayborne Dana, NP

## 2023-12-12 NOTE — Assessment & Plan Note (Signed)
 Increased careless mistakes attributed to ADHD. Currently using supplements, considering medication if ineffective. Previously used Vyvanse in 2018. - Monitor response to supplements for ADHD symptoms. - Consider reinitiating Vyvanse if symptoms persist and supplements are ineffective.

## 2023-12-12 NOTE — Assessment & Plan Note (Signed)
-   Continue Dupixent injections every three to four weeks as needed based on symptoms.

## 2023-12-24 ENCOUNTER — Ambulatory Visit: Payer: 59 | Admitting: Adult Health

## 2023-12-24 VITALS — BP 141/80 | HR 84

## 2023-12-24 DIAGNOSIS — G43709 Chronic migraine without aura, not intractable, without status migrainosus: Secondary | ICD-10-CM

## 2023-12-24 MED ORDER — ONABOTULINUMTOXINA 100 UNITS IJ SOLR
155.0000 [IU] | Freq: Once | INTRAMUSCULAR | Status: AC
Start: 2023-12-24 — End: 2023-12-24
  Administered 2023-12-24: 155 [IU] via INTRAMUSCULAR

## 2023-12-24 NOTE — Progress Notes (Signed)
 Update 12/24/2023 JM: Patient is being seen for initial Botox injection.  She is currently experiencing about 10 migraines per month which can last up to a couple days.  Emgality discontinued due to constipation, last injection 1 month ago.  She currently sees Botox for TMJ through dentist.  Tolerated procedure well today.  Return in 3 months for repeat injection.        Consent Form Botulism Toxin Injection For Chronic Migraine    Reviewed orally with patient, additionally signature is on file:  Botulism toxin has been approved by the Federal drug administration for treatment of chronic migraine. Botulism toxin does not cure chronic migraine and it may not be effective in some patients.  The administration of botulism toxin is accomplished by injecting a small amount of toxin into the muscles of the neck and head. Dosage must be titrated for each individual. Any benefits resulting from botulism toxin tend to wear off after 3 months with a repeat injection required if benefit is to be maintained. Injections are usually done every 3-4 months with maximum effect peak achieved by about 2 or 3 weeks. Botulism toxin is expensive and you should be sure of what costs you will incur resulting from the injection.  The side effects of botulism toxin use for chronic migraine may include:   -Transient, and usually mild, facial weakness with facial injections  -Transient, and usually mild, head or neck weakness with head/neck injections  -Reduction or loss of forehead facial animation due to forehead muscle weakness  -Eyelid drooping  -Dry eye  -Pain at the site of injection or bruising at the site of injection  -Double vision  -Potential unknown long term risks   Contraindications: You should not have Botox if you are pregnant, nursing, allergic to albumin, have an infection, skin condition, or muscle weakness at the site of the injection, or have myasthenia gravis, Lambert-Eaton syndrome,  or ALS.  It is also possible that as with any injection, there may be an allergic reaction or no effect from the medication. Reduced effectiveness after repeated injections is sometimes seen and rarely infection at the injection site may occur. All care will be taken to prevent these side effects. If therapy is given over a long time, atrophy and wasting in the muscle injected may occur. Occasionally the patient's become refractory to treatment because they develop antibodies to the toxin. In this event, therapy needs to be modified.  I have read the above information and consent to the administration of botulism toxin.    BOTOX PROCEDURE NOTE FOR MIGRAINE HEADACHE  Contraindications and precautions discussed with patient(above). Aseptic procedure was observed and patient tolerated procedure. Procedure performed by Ihor Austin, AGNP-BC.   The condition has existed for more than 6 months, and pt does not have a diagnosis of ALS, Myasthenia Gravis or Lambert-Eaton Syndrome.  Risks and benefits of injections discussed and pt agrees to proceed with the procedure.  Written consent obtained  These injections are medically necessary. Pt  receives good benefits from these injections. These injections do not cause sedations or hallucinations which the oral therapies may cause.   Description of procedure:  The patient was placed in a sitting position. The standard protocol was used for Botox as follows, with 5 units of Botox injected at each site:  -Procerus muscle, midline injection  -Corrugator muscle, bilateral injection  -Frontalis muscle, bilateral injection, with 2 sites each side, medial injection was performed in the upper one third of the  frontalis muscle, in the region vertical from the medial inferior edge of the superior orbital rim. The lateral injection was again in the upper one third of the forehead vertically above the lateral limbus of the cornea, 1.5 cm lateral to the medial  injection site.  -Temporalis muscle injection, 4 sites, bilaterally. The first injection was 3 cm above the tragus of the ear, second injection site was 1.5 cm to 3 cm up from the first injection site in line with the tragus of the ear. The third injection site was 1.5-3 cm forward between the first 2 injection sites. The fourth injection site was 1.5 cm posterior to the second injection site. 5th site laterally in the temporalis  muscleat the level of the outer canthus.  -Occipitalis muscle injection, 3 sites, bilaterally. The first injection was done one half way between the occipital protuberance and the tip of the mastoid process behind the ear. The second injection site was done lateral and superior to the first, 1 fingerbreadth from the first injection. The third injection site was 1 fingerbreadth superiorly and medially from the first injection site.  -Cervical paraspinal muscle injection, 2 sites, bilaterally. The first injection site was 1 cm from the midline of the cervical spine, 3 cm inferior to the lower border of the occipital protuberance. The second injection site was 1.5 cm superiorly and laterally to the first injection site.  -Trapezius muscle injection was performed at 3 sites, bilaterally. The first injection site was in the upper trapezius muscle halfway between the inflection point of the neck, and the acromion. The second injection site was one half way between the acromion and the first injection site. The third injection was done between the first injection site and the inflection point of the neck.    A total of 200 units of Botox was prepared, 155 units of Botox was injected as documented above, any Botox not injected was wasted. The patient tolerated the procedure well, there were no complications of the above procedure.   Ihor Austin, AGNP-BC  [facility] [address] [place], [place] [address]  Phone [phone] Fax  [phone] Note: This document was prepared with digital dictation and possible smart phrase technology. Any transcriptional errors that result from this process are unintentional.

## 2023-12-24 NOTE — Progress Notes (Signed)
 Botox- 100 units x 2 vial Lot: [id number] Expiration: 01/2026 NDC: 4696-2952-84  Bacteriostatic 0.9% Sodium Chloride- 4mL total [id number] Expiration: 07/26/24 NDC: 7253-6644-03  Dx: G43.709 BB  Witnessed by: Clemencia Course

## 2023-12-26 ENCOUNTER — Encounter: Payer: Self-pay | Admitting: Family Medicine

## 2023-12-26 DIAGNOSIS — Z0000 Encounter for general adult medical examination without abnormal findings: Secondary | ICD-10-CM

## 2023-12-26 DIAGNOSIS — E782 Mixed hyperlipidemia: Secondary | ICD-10-CM

## 2023-12-26 DIAGNOSIS — E559 Vitamin D deficiency, unspecified: Secondary | ICD-10-CM

## 2023-12-26 NOTE — Telephone Encounter (Signed)
Any other labs needed?

## 2023-12-27 ENCOUNTER — Ambulatory Visit (INDEPENDENT_AMBULATORY_CARE_PROVIDER_SITE_OTHER): Payer: 59 | Admitting: Family Medicine

## 2023-12-27 ENCOUNTER — Encounter: Payer: Self-pay | Admitting: Family Medicine

## 2023-12-27 VITALS — BP 137/93 | HR 100 | Ht 60.0 in | Wt 139.0 lb

## 2023-12-27 DIAGNOSIS — I10 Essential (primary) hypertension: Secondary | ICD-10-CM | POA: Diagnosis not present

## 2023-12-27 DIAGNOSIS — Z0000 Encounter for general adult medical examination without abnormal findings: Secondary | ICD-10-CM

## 2023-12-27 HISTORY — DX: Essential (primary) hypertension: I10

## 2023-12-27 MED ORDER — HYDROCHLOROTHIAZIDE 12.5 MG PO TABS: 12.5000 mg | ORAL_TABLET | Freq: Every day | ORAL | 3 refills | Status: DC

## 2023-12-27 NOTE — Assessment & Plan Note (Signed)
 Blood pressure is not at goal for age and co-morbidities.   Recommendations: start HCTZ 12.5 mg daily; nurse visit in 2-4 weeks - BP goal <130/80 - monitor and log blood pressures at home - check around the same time each day in a relaxed setting - Limit salt to <2000 mg/day - Follow DASH eating plan (heart healthy diet) - limit alcohol to 2 standard drinks per day for men and 1 per day for women - avoid tobacco products - get at least 2 hours of regular aerobic exercise weekly Patient aware of signs/symptoms requiring further/urgent evaluation. Labs scheduled

## 2023-12-27 NOTE — Progress Notes (Signed)
 Complete physical exam  Patient: Whitney Trujillo   DOB: 04/17/1976   48 y.o. Female  MRN: [id number]  Subjective:    Chief Complaint  Patient presents with   Annual Exam    Rayen Eleina Jergens is a 48 y.o. female who presents today for a complete physical exam. She reports consuming a general diet; gluten-free. Home exercise routine includes walking, pilates. She generally feels well. She reports sleeping well. She does not have additional problems to discuss today.   Currently lives with: spouse Acute concerns or interim problems since last visit: no  Vision concerns: no Dental concerns: no STD concerns: no  ETOH use: occasional Nicotine use: no Recreational drugs/illegal substances: no   Females:  She is currently  sexually active  Contraception choices are: hysterectomy LMP: n/a     Most recent fall risk assessment:    12/12/2023    4:32 PM  Fall Risk   Falls in the past year? 0  Number falls in past yr: 0  Injury with Fall? 0  Risk for fall due to : No Fall Risks  Follow up Falls evaluation completed     Most recent depression screenings:    12/12/2023    4:32 PM 12/12/2021    3:13 PM  PHQ 2/9 Scores  PHQ - 2 Score 0 0  PHQ- 9 Score 2             Patient Care Team: Clayborne Dana, NP as PCP - General (Family Medicine) Ola Spurr., MD (Obstetrics and Gynecology) Farris Has, MD as Referring Physician (Dermatology)   Outpatient Medications Prior to Visit  Medication Sig   DULoxetine (CYMBALTA) 20 MG capsule Take 60 mg by mouth daily.   Dupilumab (DUPIXENT) 300 MG/2ML SOPN Inject 300 mg into the skin every 14 (fourteen) days.   estradiol (VIVELLE-DOT) 0.025 MG/24HR Place 1 patch onto the skin 2 (two) times a week.   gabapentin (NEURONTIN) 100 MG capsule Take 1 capsule (100 mg total) by mouth 3 (three) times daily as needed (pain).   methocarbamol (ROBAXIN) 500 MG tablet Take 500 mg by mouth 2 (two) times daily as needed for  muscle spasms.   OVER THE COUNTER MEDICATION Take 1 Dose by mouth daily at 8 pm. 3 tsp powder consisting of: Magnesium carbonate 320 mg, vitamin C 265 mg, vitamin D 3 mcg, calcium 210 mg, potassium 110 mg   Triamcinolone Acetonide (NASACORT AQ NA) Place 1 spray into both nostrils daily as needed (allergies).   triamcinolone ointment (KENALOG) 0.1 % Apply 1 application. topically 2 (two) times daily as needed (rash).   Turmeric 500 MG CAPS See admin instructions.   Facility-Administered Medications Prior to Visit  Medication Dose Route Frequency Provider   0.9 %  sodium chloride infusion  500 mL Intravenous Once Pyrtle, Carie Caddy, MD    ROS All review of systems negative except what is listed in the HPI        Objective:     BP (!) 137/93   Pulse 100   Ht 5' (1.524 m)   Wt 139 lb (63 kg)   SpO2 98%   BMI 27.15 kg/m    BP Readings from Last 3 Encounters:  12/27/23 (!) 137/93  12/24/23 (!) 141/80  12/12/23 (!) 122/59     Physical Exam Vitals reviewed.  Constitutional:      General: She is not in acute distress.    Appearance: Normal appearance. She is not ill-appearing.  HENT:  Head: Normocephalic and atraumatic.     Right Ear: Tympanic membrane normal.     Left Ear: Tympanic membrane normal.     Nose: Nose normal.     Mouth/Throat:     Mouth: Mucous membranes are moist.     Pharynx: Oropharynx is clear.  Eyes:     Extraocular Movements: Extraocular movements intact.     Conjunctiva/sclera: Conjunctivae normal.     Pupils: Pupils are equal, round, and reactive to light.  Cardiovascular:     Rate and Rhythm: Normal rate and regular rhythm.     Pulses: Normal pulses.     Heart sounds: Normal heart sounds.  Pulmonary:     Effort: Pulmonary effort is normal.     Breath sounds: Normal breath sounds.  Abdominal:     General: Abdomen is flat. Bowel sounds are normal. There is no distension.     Palpations: Abdomen is soft. There is no mass.     Tenderness: There  is no abdominal tenderness. There is no right CVA tenderness, left CVA tenderness, guarding or rebound.  Genitourinary:    Comments: Deferred exam Musculoskeletal:        General: Normal range of motion.     Cervical back: Normal range of motion and neck supple. No tenderness.     Right lower leg: No edema.     Left lower leg: No edema.  Lymphadenopathy:     Cervical: No cervical adenopathy.  Skin:    General: Skin is warm and dry.     Capillary Refill: Capillary refill takes less than 2 seconds.  Neurological:     General: No focal deficit present.     Mental Status: She is alert and oriented to person, place, and time. Mental status is at baseline.  Psychiatric:        Mood and Affect: Mood normal.        Behavior: Behavior normal.        Thought Content: Thought content normal.        Judgment: Judgment normal.        No results found for any visits on 12/27/23.     Assessment & Plan:    Routine Health Maintenance and Physical Exam Discussed health promotion and safety including diet and exercise recommendations, dental health, and injury prevention. Tobacco cessation if applicable. Seat belts, sunscreen, smoke detectors, etc.    Immunization History  Administered Date(s) Administered   Influenza, Quadrivalent, Recombinant, Inj, Pf 06/30/2018   Influenza,inj,Quad PF,6+ Mos 05/29/2017   Influenza-Unspecified 06/30/2014, 06/25/2018   PFIZER(Purple Top)SARS-COV-2 Vaccination 06/06/2020, 06/27/2020   Tdap 10/04/2018    Health Maintenance  Topic Date Due   INFLUENZA VACCINE  04/25/2024   Colonoscopy  05/09/2027   DTaP/Tdap/Td (2 - Td or Tdap) 10/04/2028   Hepatitis C Screening  Completed   HPV VACCINES  Aged Out   COVID-19 Vaccine  Discontinued   HIV Screening  Discontinued        Problem List Items Addressed This Visit       Active Problems   Primary hypertension   Blood pressure is not at goal for age and co-morbidities.   Recommendations: start HCTZ  12.5 mg daily; nurse visit in 2-4 weeks - BP goal <130/80 - monitor and log blood pressures at home - check around the same time each day in a relaxed setting - Limit salt to <2000 mg/day - Follow DASH eating plan (heart healthy diet) - limit alcohol to 2 standard drinks per day for  men and 1 per day for women - avoid tobacco products - get at least 2 hours of regular aerobic exercise weekly Patient aware of signs/symptoms requiring further/urgent evaluation. Labs scheduled       Relevant Medications   hydrochlorothiazide (HYDRODIURIL) 12.5 MG tablet   Other Visit Diagnoses       Annual physical exam    -  Primary        PATIENT COUNSELING:    Sexuality: Discussed sexually transmitted diseases, partner selection, use of condoms, avoidance of unintended pregnancy, and contraceptive alternatives.    I discussed with the patient that most people either abstain from alcohol or drink within safe limits (<=14/week and <=4 drinks/occasion for males, <=7/weeks and <= 3 drinks/occasion for females) and that the risk for alcohol disorders and other health effects rises proportionally with the number of drinks per week and how often a drinker exceeds daily limits.  Discussed cessation/primary prevention of drug use and availability of treatment for abuse.   Diet: Encouraged to adjust caloric intake to maintain or achieve ideal body weight, to reduce intake of dietary saturated fat and total fat, to limit sodium intake by avoiding high sodium foods and not adding table salt, and to maintain adequate dietary potassium and calcium preferably from fresh fruits, vegetables, and low-fat dairy products. Encouraged vitamin D 1000 units and Calcium 1300mg  or 4 servings of dairy a day.  Emphasized the importance of regular exercise.  Injury prevention: Discussed safety belts, safety helmets, smoke detector, smoking near bedding or upholstery.   Dental health: Discussed importance of regular tooth  brushing, flossing, and dental visits.     Return in about 1 month (around 01/26/2024) for BP check with nurse.     Clayborne Dana, NP

## 2024-01-01 ENCOUNTER — Encounter: Payer: Self-pay | Admitting: Family Medicine

## 2024-01-01 ENCOUNTER — Other Ambulatory Visit (INDEPENDENT_AMBULATORY_CARE_PROVIDER_SITE_OTHER)

## 2024-01-01 ENCOUNTER — Ambulatory Visit: Payer: 59 | Admitting: Neurology

## 2024-01-01 DIAGNOSIS — E559 Vitamin D deficiency, unspecified: Secondary | ICD-10-CM | POA: Diagnosis not present

## 2024-01-01 DIAGNOSIS — Z0000 Encounter for general adult medical examination without abnormal findings: Secondary | ICD-10-CM

## 2024-01-01 DIAGNOSIS — E782 Mixed hyperlipidemia: Secondary | ICD-10-CM

## 2024-01-01 LAB — CBC WITH DIFFERENTIAL/PLATELET
Basophils Absolute: 0 10*3/uL (ref 0.0–0.1)
Basophils Relative: 0.6 % (ref 0.0–3.0)
Eosinophils Absolute: 0.1 10*3/uL (ref 0.0–0.7)
Eosinophils Relative: 0.7 % (ref 0.0–5.0)
HCT: 49 % — ABNORMAL HIGH (ref 36.0–46.0)
Hemoglobin: 16.4 g/dL — ABNORMAL HIGH (ref 12.0–15.0)
Lymphocytes Relative: 28.8 % (ref 12.0–46.0)
Lymphs Abs: 2.3 10*3/uL (ref 0.7–4.0)
MCHC: 33.4 g/dL (ref 30.0–36.0)
MCV: 90.8 fl (ref 78.0–100.0)
Monocytes Absolute: 0.6 10*3/uL (ref 0.1–1.0)
Monocytes Relative: 7.9 % (ref 3.0–12.0)
Neutro Abs: 5 10*3/uL (ref 1.4–7.7)
Neutrophils Relative %: 62 % (ref 43.0–77.0)
Platelets: 340 10*3/uL (ref 150.0–400.0)
RBC: 5.39 Mil/uL — ABNORMAL HIGH (ref 3.87–5.11)
RDW: 13 % (ref 11.5–15.5)
WBC: 8 10*3/uL (ref 4.0–10.5)

## 2024-01-01 LAB — LIPID PANEL
Cholesterol: 229 mg/dL — ABNORMAL HIGH (ref 0–200)
HDL: 47.5 mg/dL (ref >39.00)
LDL Cholesterol: 119 mg/dL — ABNORMAL HIGH (ref 0–99)
NonHDL: 181.37
Total CHOL/HDL Ratio: 5
Triglycerides: 314 mg/dL — ABNORMAL HIGH (ref 0.0–149.0)
VLDL: 62.8 mg/dL — ABNORMAL HIGH (ref 0.0–40.0)

## 2024-01-01 LAB — TSH: TSH: 2.28 u[IU]/mL (ref 0.35–5.50)

## 2024-01-01 LAB — COMPREHENSIVE METABOLIC PANEL WITH GFR
ALT: 32 U/L (ref 0–35)
AST: 21 U/L (ref 0–37)
Albumin: 4.7 g/dL (ref 3.5–5.2)
Alkaline Phosphatase: 51 U/L (ref 39–117)
BUN: 17 mg/dL (ref 6–23)
CO2: 29 meq/L (ref 19–32)
Calcium: 9.5 mg/dL (ref 8.4–10.5)
Chloride: 99 meq/L (ref 96–112)
Creatinine, Ser: 0.94 mg/dL (ref 0.40–1.20)
GFR: 71.26 mL/min (ref >60.00)
Glucose, Bld: 102 mg/dL — ABNORMAL HIGH (ref 70–99)
Potassium: 4.3 meq/L (ref 3.5–5.1)
Sodium: 138 meq/L (ref 135–145)
Total Bilirubin: 1.3 mg/dL — ABNORMAL HIGH (ref 0.2–1.2)
Total Protein: 6.9 g/dL (ref 6.0–8.3)

## 2024-01-01 LAB — VITAMIN D 25 HYDROXY (VIT D DEFICIENCY, FRACTURES): VITD: 33.77 ng/mL (ref 30.00–100.00)

## 2024-01-08 ENCOUNTER — Encounter: Payer: Self-pay | Admitting: Family Medicine

## 2024-01-15 ENCOUNTER — Encounter: Payer: Self-pay | Admitting: Family Medicine

## 2024-01-15 DIAGNOSIS — F988 Other specified behavioral and emotional disorders with onset usually occurring in childhood and adolescence: Secondary | ICD-10-CM

## 2024-01-16 MED ORDER — METHYLPHENIDATE HCL ER (CD) 10 MG PO CPCR
10.0000 mg | ORAL_CAPSULE | ORAL | 0 refills | Status: AC
Start: 2024-01-16 — End: ?

## 2024-01-16 NOTE — Addendum Note (Signed)
 Addended by: Minna Amass B on: 01/16/2024 03:06 PM   Modules accepted: Orders

## 2024-01-30 ENCOUNTER — Ambulatory Visit: Admitting: Family Medicine

## 2024-01-30 VITALS — BP 126/84 | HR 95

## 2024-01-30 DIAGNOSIS — I10 Essential (primary) hypertension: Secondary | ICD-10-CM | POA: Diagnosis not present

## 2024-01-30 NOTE — Progress Notes (Signed)
 BP Readings from Last 3 Encounters:  12/27/23 (!) 137/93  12/24/23 (!) 141/80  12/12/23 (!) 122/59    Pt here for Blood pressure check per Minna Amass  Pt currently takes: Hydrochlorothiazide  12.5 mg Tablet Blood pressure is not at goal for age and co-morbidities.   Recommendations: start HCTZ 12.5 mg daily; nurse visit in 2-4 weeks - BP goal <130/80 - monitor and log blood pressures at home - check around the same time each day in a relaxed setting - Limit salt to <2000 mg/day - Follow DASH eating plan (heart healthy diet)   Pt reports compliance with medication.  BP today @ = 130/86 (L) /  126/84 (R) HR = 88 (L) / 95 (R)  Pt advised per Minna Amass continue with current regimen and follow up in 3 months.

## 2024-02-26 ENCOUNTER — Other Ambulatory Visit: Payer: Self-pay | Admitting: Family Medicine

## 2024-02-26 ENCOUNTER — Other Ambulatory Visit (HOSPITAL_BASED_OUTPATIENT_CLINIC_OR_DEPARTMENT_OTHER): Payer: Self-pay | Admitting: Family Medicine

## 2024-02-26 DIAGNOSIS — Z1231 Encounter for screening mammogram for malignant neoplasm of breast: Secondary | ICD-10-CM

## 2024-03-07 DIAGNOSIS — M150 Primary generalized (osteo)arthritis: Secondary | ICD-10-CM | POA: Insufficient documentation

## 2024-03-10 ENCOUNTER — Encounter: Payer: Self-pay | Admitting: Family Medicine

## 2024-03-10 ENCOUNTER — Ambulatory Visit: Admitting: Family Medicine

## 2024-03-10 VITALS — BP 132/74 | HR 88 | Ht 60.0 in | Wt 136.0 lb

## 2024-03-10 DIAGNOSIS — Z79899 Other long term (current) drug therapy: Secondary | ICD-10-CM | POA: Diagnosis not present

## 2024-03-10 DIAGNOSIS — M5416 Radiculopathy, lumbar region: Secondary | ICD-10-CM | POA: Diagnosis not present

## 2024-03-10 DIAGNOSIS — F988 Other specified behavioral and emotional disorders with onset usually occurring in childhood and adolescence: Secondary | ICD-10-CM

## 2024-03-10 MED ORDER — METHYLPHENIDATE HCL ER (CD) 10 MG PO CPCR
10.0000 mg | ORAL_CAPSULE | ORAL | 0 refills | Status: DC
Start: 2024-03-10 — End: 2024-07-24

## 2024-03-10 MED ORDER — METHYLPHENIDATE HCL ER (CD) 10 MG PO CPCR
10.0000 mg | ORAL_CAPSULE | ORAL | 0 refills | Status: DC
Start: 2024-05-08 — End: 2024-07-24

## 2024-03-10 MED ORDER — METHYLPHENIDATE HCL ER (CD) 10 MG PO CPCR
10.0000 mg | ORAL_CAPSULE | ORAL | 0 refills | Status: DC
Start: 2024-04-08 — End: 2024-07-24

## 2024-03-10 MED ORDER — DULOXETINE HCL 30 MG PO CPEP: 60.0000 mg | ORAL_CAPSULE | Freq: Every day | ORAL | 1 refills | Status: DC

## 2024-03-10 NOTE — Progress Notes (Signed)
 Established Patient Office Visit  Subjective   Patient ID: Whitney Trujillo, female    DOB: 1976/07/18  Age: 48 y.o. MRN: [id number]  Chief Complaint  Patient presents with   ADHD   Medical Management of Chronic Issues    HPI   Discussed the use of AI scribe software for clinical note transcription with the patient, who gave verbal consent to proceed.  History of Present Illness Whitney Trujillo is a 48 year old female who presents for a medication review and blood pressure management.  About a month ago, she discovered a medication error with her prescription for Cymbalta (duloxetine), taking 120 mg daily instead of the intended 60 mg. The error was identified when she noticed the difference in capsule size while using her daughter's medication, which was supposed to be the same dosage. States previous provider had given her wrong dose.  She has been monitoring her blood pressure and noted it was elevated. The issue with the Cymbalta dosage was identified when she resumed taking methylphenidate . She takes methylphenidate  at 10 mg, which works well for her without causing jitteriness or affecting her appetite. She feels less anxious and more focused when taking it, describing it as 'wonderful' for managing her ADHD symptoms.  She mentions a recent stressful event involving her mother, who experienced atrial fibrillation and was hospitalized with high blood pressure. This situation reminded her of her husband's heart attack in November, which was a stressful time for her. Despite these events, she feels more at ease when she understands the medical situation.    ADHD/ADD: - Medications: methylphenidate  CR 10 mg daily  - Satisfied with current therapy: yes - Controlled substance contract: today  - UDS: due - Previous psychiatry evaluation: yes - Previous medications:  - ADHD Medication Side Effects: no    Decreased appetite: no    Headache: no    Sleeping  disturbance pattern: no    Irritability: no    Anxiousness: no    Dizziness: no      ROS All review of systems negative except what is listed in the HPI    Objective:     BP 132/74   Pulse 88   Ht 5' (1.524 m)   Wt 136 lb (61.7 kg)   SpO2 100%   BMI 26.56 kg/m    Physical Exam Vitals reviewed.  Constitutional:      General: She is not in acute distress.    Appearance: Normal appearance. She is normal weight. She is not ill-appearing.   Cardiovascular:     Rate and Rhythm: Normal rate and regular rhythm.     Heart sounds: Normal heart sounds.  Pulmonary:     Effort: Pulmonary effort is normal.     Breath sounds: Normal breath sounds.   Skin:    General: Skin is warm and dry.   Neurological:     General: No focal deficit present.     Mental Status: She is alert and oriented to person, place, and time. Mental status is at baseline.   Psychiatric:        Mood and Affect: Mood normal.        Behavior: Behavior normal.        Thought Content: Thought content normal.        Judgment: Judgment normal.        No results found for any visits on 03/10/24.    The 10-year ASCVD risk score (Arnett DK, et al., 2019) is: 2.4%  Assessment & Plan:   Problem List Items Addressed This Visit       Active Problems   ADD (attention deficit disorder) - Primary   Methylphenidate  10 mg effectively manages her symptoms without side effects. - Continue methylphenidate  10 mg as currently prescribed. - UDS and Contract today       Relevant Medications   methylphenidate  (METADATE  CD) 10 MG CR capsule   methylphenidate  (METADATE  CD) 10 MG CR capsule (Start on 04/08/2024)   methylphenidate  (METADATE  CD) 10 MG CR capsule (Start on 05/08/2024)   Lumbar radiculopathy   Stable. Refill duloxetine 60 mg daily (reports last provider accidentally had her taking double dose, but she reduced on her own and has been feeling fine).      Relevant Medications   methylphenidate   (METADATE  CD) 10 MG CR capsule   methylphenidate  (METADATE  CD) 10 MG CR capsule (Start on 04/08/2024)   methylphenidate  (METADATE  CD) 10 MG CR capsule (Start on 05/08/2024)   DULoxetine (CYMBALTA) 30 MG capsule   Other Visit Diagnoses       High risk medication use       Relevant Orders   Drug Monitoring Panel [phone] , Urine          Return in about 3 months (around 06/10/2024) for routine follow-up (ADHD).    Everlina Hock, NP

## 2024-03-10 NOTE — Assessment & Plan Note (Signed)
 Methylphenidate  10 mg effectively manages her symptoms without side effects. - Continue methylphenidate  10 mg as currently prescribed. - UDS and Contract today

## 2024-03-10 NOTE — Assessment & Plan Note (Signed)
 Stable. Refill duloxetine 60 mg daily (reports last provider accidentally had her taking double dose, but she reduced on her own and has been feeling fine).

## 2024-03-17 ENCOUNTER — Ambulatory Visit: Admitting: Adult Health

## 2024-03-17 ENCOUNTER — Other Ambulatory Visit

## 2024-03-17 VITALS — BP 140/82 | HR 83

## 2024-03-17 DIAGNOSIS — G43709 Chronic migraine without aura, not intractable, without status migrainosus: Secondary | ICD-10-CM | POA: Diagnosis not present

## 2024-03-17 MED ORDER — ONABOTULINUMTOXINA 200 UNITS IJ SOLR
155.0000 [IU] | Freq: Once | INTRAMUSCULAR | Status: AC
Start: 2024-03-17 — End: 2024-03-17
  Administered 2024-03-17: 155 [IU] via INTRAMUSCULAR

## 2024-03-17 NOTE — Progress Notes (Signed)
 Update 12/24/2023 Whitney Trujillo: Patient is being seen for repeat Botox  injection, prior injection 12/24/2023 (initial injection).  Reports great improvement of migraines after initial injection, down to about 4/month which is >50% migraine reduction. Has had some increase migraines over the past few weeks as botox  wearing off. 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 Tolerated procedure well today.  Return in 3 months for repeat injection.        Consent Form Botulism Toxin Injection For Chronic Migraine    Reviewed orally with patient, additionally signature is on file:  Botulism toxin has been approved by the Federal drug administration for treatment of chronic migraine. Botulism toxin does not cure chronic migraine and it may not be effective in some patients.  The administration of botulism toxin is accomplished by injecting a small amount of toxin into the muscles of the neck and head. Dosage must be titrated for each individual. Any benefits resulting from botulism toxin tend to wear off after 3 months with a repeat injection required if benefit is to be maintained. Injections are usually done every 3-4 months with maximum effect peak achieved by about 2 or 3 weeks. Botulism toxin is expensive and you should be sure of what costs you will incur resulting from the injection.  The side effects of botulism toxin use for chronic migraine may include:   -Transient, and usually mild, facial weakness with facial injections  -Transient, and usually mild, head or neck weakness with head/neck injections  -Reduction or loss of forehead facial animation due to forehead muscle weakness  -Eyelid drooping  -Dry eye  -Pain at the site of injection or bruising at the site of injection  -Double vision  -Potential unknown long term risks   Contraindications: You should not have Botox  if you are pregnant, nursing, allergic to albumin, have an infection, skin condition, or muscle weakness  at the site of the injection, or have myasthenia gravis, Lambert-Eaton syndrome, or ALS.  It is also possible that as with any injection, there may be an allergic reaction or no effect from the medication. Reduced effectiveness after repeated injections is sometimes seen and rarely infection at the injection site may occur. All care will be taken to prevent these side effects. If therapy is given over a long time, atrophy and wasting in the muscle injected may occur. Occasionally the patient's become refractory to treatment because they develop antibodies to the toxin. In this event, therapy needs to be modified.  I have read the above information and consent to the administration of botulism toxin.    BOTOX  PROCEDURE NOTE FOR MIGRAINE HEADACHE  Contraindications and precautions discussed with patient(above). Aseptic procedure was observed and patient tolerated procedure. Procedure performed by Harlene Bogaert, AGNP-BC.   The condition has existed for more than 6 months, and pt does not have a diagnosis of ALS, Myasthenia Gravis or Lambert-Eaton Syndrome.  Risks and benefits of injections discussed and pt agrees to proceed with the procedure.  Written consent obtained  These injections are medically necessary. Pt  receives good benefits from these injections. These injections do not cause sedations or hallucinations which the oral therapies may cause.   Description of procedure:  The patient was placed in a sitting position. The standard protocol was used for Botox  as follows, with 5 units of Botox  injected at each site:  -Procerus muscle, midline injection  -Corrugator muscle, bilateral injection  -Frontalis muscle, bilateral injection, with 2 sites each side, medial injection was performed  in the upper one third of the frontalis muscle, in the region vertical from the medial inferior edge of the superior orbital rim. The lateral injection was again in the upper one third of the forehead  vertically above the lateral limbus of the cornea, 1.5 cm lateral to the medial injection site.  -Temporalis muscle injection, 4 sites, bilaterally. The first injection was 3 cm above the tragus of the ear, second injection site was 1.5 cm to 3 cm up from the first injection site in line with the tragus of the ear. The third injection site was 1.5-3 cm forward between the first 2 injection sites. The fourth injection site was 1.5 cm posterior to the second injection site. 5th site laterally in the temporalis  muscleat the level of the outer canthus.  -Occipitalis muscle injection, 3 sites, bilaterally. The first injection was done one half way between the occipital protuberance and the tip of the mastoid process behind the ear. The second injection site was done lateral and superior to the first, 1 fingerbreadth from the first injection. The third injection site was 1 fingerbreadth superiorly and medially from the first injection site.  -Cervical paraspinal muscle injection, 2 sites, bilaterally. The first injection site was 1 cm from the midline of the cervical spine, 3 cm inferior to the lower border of the occipital protuberance. The second injection site was 1.5 cm superiorly and laterally to the first injection site.  -Trapezius muscle injection was performed at 3 sites, bilaterally. The first injection site was in the upper trapezius muscle halfway between the inflection point of the neck, and the acromion. The second injection site was one half way between the acromion and the first injection site. The third injection was done between the first injection site and the inflection point of the neck.    A total of 200 units of Botox  was prepared, 155 units of Botox  was injected as documented above, any Botox  not injected was wasted. The patient tolerated the procedure well, there were no complications of the above procedure.   Harlene Bogaert, AGNP-BC  [facility] [address] [place], [place] [address]  Phone [phone] Fax [phone] Note: This document was prepared with digital dictation and possible smart phrase technology. Any transcriptional errors that result from this process are unintentional.

## 2024-03-17 NOTE — Progress Notes (Signed)
 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

## 2024-03-19 LAB — DRUG MONITORING PANEL 376104, URINE
Amphetamines: NEGATIVE ng/mL (ref <500)
Barbiturates: NEGATIVE ng/mL (ref <300)
Benzodiazepines: NEGATIVE ng/mL (ref <100)
Cocaine Metabolite: NEGATIVE ng/mL (ref <150)
Desmethyltramadol: NEGATIVE ng/mL (ref <100)
Opiates: NEGATIVE ng/mL (ref <100)
Oxycodone: NEGATIVE ng/mL (ref <100)
Tramadol: NEGATIVE ng/mL (ref <100)

## 2024-03-19 LAB — DM TEMPLATE

## 2024-03-24 ENCOUNTER — Ambulatory Visit (HOSPITAL_BASED_OUTPATIENT_CLINIC_OR_DEPARTMENT_OTHER)
Admission: RE | Admit: 2024-03-24 | Discharge: 2024-03-24 | Disposition: A | Discharge status: Home / Self Care | Source: Ambulatory Visit | Attending: Family Medicine | Admitting: Family Medicine

## 2024-03-24 ENCOUNTER — Encounter (HOSPITAL_BASED_OUTPATIENT_CLINIC_OR_DEPARTMENT_OTHER): Payer: Self-pay

## 2024-03-24 DIAGNOSIS — Z1231 Encounter for screening mammogram for malignant neoplasm of breast: Secondary | ICD-10-CM | POA: Diagnosis present

## 2024-05-06 LAB — TSH: TSH: 2.05 (ref 0.41–5.90)

## 2024-05-08 ENCOUNTER — Ambulatory Visit: Admitting: Family Medicine

## 2024-05-08 ENCOUNTER — Encounter: Payer: Self-pay | Admitting: Family Medicine

## 2024-05-08 VITALS — BP 133/51 | HR 89 | Temp 98.3°F | Resp 16 | Ht 60.0 in | Wt 141.2 lb

## 2024-05-08 DIAGNOSIS — I10 Essential (primary) hypertension: Secondary | ICD-10-CM | POA: Diagnosis not present

## 2024-05-08 DIAGNOSIS — M2550 Pain in unspecified joint: Secondary | ICD-10-CM | POA: Diagnosis not present

## 2024-05-08 DIAGNOSIS — F988 Other specified behavioral and emotional disorders with onset usually occurring in childhood and adolescence: Secondary | ICD-10-CM

## 2024-05-08 NOTE — Assessment & Plan Note (Signed)
-   Continue current ADHD management plan with as-needed use of medication.

## 2024-05-08 NOTE — Assessment & Plan Note (Signed)
 Patient concerned for rheumatoid arthritis with family history. Previous ANA tests negative, symptoms persist. - Place future order for ANA test during symptom flare-up. - Advise to call for lab appointment during flare-up.

## 2024-05-08 NOTE — Progress Notes (Signed)
 Established Patient Office Visit  Subjective   Patient ID: Whitney Trujillo, female    DOB: [birth year]  Age: 48 y.o. MRN: [id number]  Chief Complaint  Patient presents with   Follow-up    BP     HPI   Discussed the use of AI scribe software for clinical note transcription with the patient, who gave verbal consent to proceed.  History of Present Illness Whitney Trujillo is a 48 year old female with hypertension who presents for a follow-up visit for blood pressure management.  She is currently managing her blood pressure with prednisone  and thiazide. She experiences increased urination, particularly when taking the medication in the morning. Her home blood pressure monitoring shows a recent reading of 117 mmHg before bed.   She has concerns about her thyroid  function, noting low T3 and T4 levels with normal TSH at another provider's office recently. Despite polyarthralgia symptoms, ANA panels have been negative for rheumatoid arthritis. She experiences new painful nodules on her second toes and joint pain in her hands, feet, and neck. Her family history is significant for an uncle with severe rheumatoid arthritis.  She has discussed her neck issues with an orthopedic specialist, who diagnosed osteoarthritis, though she is uncertain about this diagnosis.   Regarding ADHD, she takes medication occasionally but not daily due to adverse effects on mood and blood pressure.     Hypertension: - Medications: hctz 12.5 mg daily,  - Compliance: good - Checking BP at home: yes <130/80 - Denies any SOB, recurrent headaches, CP, vision changes, LE edema, dizziness, palpitations, or medication side effects.        ROS All review of systems negative except what is listed in the HPI    Objective:     BP (!) 133/51   Pulse 89   Temp 98.3 F (36.8 C) (Oral)   Resp 16   Ht 5' (1.524 m)   Wt 141 lb 4 oz (64.1 kg)   SpO2 100%   BMI 27.59 kg/m    Physical  Exam Vitals reviewed.  Constitutional:      General: She is not in acute distress.    Appearance: Normal appearance. She is normal weight. She is not ill-appearing.  Cardiovascular:     Rate and Rhythm: Normal rate and regular rhythm.     Heart sounds: Normal heart sounds.  Pulmonary:     Effort: Pulmonary effort is normal.     Breath sounds: Normal breath sounds.  Skin:    General: Skin is warm and dry.  Neurological:     General: No focal deficit present.     Mental Status: She is alert and oriented to person, place, and time. Mental status is at baseline.  Psychiatric:        Mood and Affect: Mood normal.        Behavior: Behavior normal.        Thought Content: Thought content normal.        Judgment: Judgment normal.      No results found for any visits on 05/08/24.    The 10-year ASCVD risk score (Arnett DK, et al., 2019) is: 2.4%    Assessment & Plan:   Problem List Items Addressed This Visit       Active Problems   ADD (attention deficit disorder)   - Continue current ADHD management plan with as-needed use of medication.      Polyarthralgia   Patient concerned for rheumatoid arthritis with family history.  Previous ANA tests negative, symptoms persist. - Place future order for ANA test during symptom flare-up. - Advise to call for lab appointment during flare-up.      Relevant Orders   Antinuclear Antib (ANA)   Primary hypertension - Primary   Blood pressure controlled with current medication. No significant side effects from hydrochlorothiazide , slight increased urination noted. Home readings normal. - Continue current antihypertensive regimen. - Order metabolic panel to monitor electrolytes due to hydrochlorothiazide  use.      Relevant Orders   Basic metabolic panel with GFR      Return in about 3 months (around 08/08/2024) for chronic disease management.    Waddell KATHEE Mon, NP

## 2024-05-08 NOTE — Assessment & Plan Note (Signed)
 Blood pressure controlled with current medication. No significant side effects from hydrochlorothiazide , slight increased urination noted. Home readings normal. - Continue current antihypertensive regimen. - Order metabolic panel to monitor electrolytes due to hydrochlorothiazide  use.

## 2024-05-09 ENCOUNTER — Ambulatory Visit: Payer: Self-pay | Admitting: Family Medicine

## 2024-05-09 LAB — BASIC METABOLIC PANEL WITH GFR
BUN: 15 mg/dL (ref 6–23)
CO2: 29 meq/L (ref 19–32)
Calcium: 9.5 mg/dL (ref 8.4–10.5)
Chloride: 97 meq/L (ref 96–112)
Creatinine, Ser: 0.84 mg/dL (ref 0.40–1.20)
GFR: 81.36 mL/min (ref >60.00)
Glucose, Bld: 96 mg/dL (ref 70–99)
Potassium: 4.1 meq/L (ref 3.5–5.1)
Sodium: 136 meq/L (ref 135–145)

## 2024-05-09 LAB — TESTOSTERONE
Testosterone, total: 25
Testosterone: 207

## 2024-05-09 LAB — T3, FREE
T3, Free: 2.7
T3, Free: 2.7

## 2024-05-09 LAB — SEX HORMONE BINDING GLOBULIN
Sex Horm Binding Glob, Serum: 15.9
Sex Horm Binding Glob, Serum: 22.8

## 2024-05-09 LAB — DHEA-SULFATE
DHEA-Sulfate, Serum: 53.7
DHEA-Sulfate, Serum: 62.2

## 2024-05-09 LAB — TESTOSTERONE, FREE
Testosterone, Free: 1
Testosterone, Free: 4.7

## 2024-05-09 LAB — PROGESTERONE
Progesterone: 0.2
Progesterone: <0.5

## 2024-05-09 LAB — ANTI-TPO AB (RDL)
Anti-TPO Ab (RDL): 1
Anti-TPO Ab (RDL): <3

## 2024-05-09 LAB — T4, FREE
T4, FREE (REFL): 2.7
T4,Free (Direct): 0.67

## 2024-05-09 LAB — FSH/LH
FSH: 4.6
FSH: 7

## 2024-05-09 LAB — ESTRADIOL
Estradiol: 42.9
Estradiol: <24

## 2024-05-09 NOTE — Telephone Encounter (Signed)
 Abstracted

## 2024-05-13 MED ORDER — ONABOTULINUMTOXINA 200 UNITS IJ SOLR: INTRAMUSCULAR | 3 refills | Status: AC

## 2024-05-13 NOTE — Telephone Encounter (Addendum)
 Received new auth for pt to be SP, please send rx to [facility].  Auth#: [id number] (05/13/24-05/13/25)

## 2024-05-13 NOTE — Addendum Note (Signed)
 Addended by: DELFINO AUGUSTIN BROCKS on: 05/13/2024 08:47 AM   Modules accepted: Orders

## 2024-05-13 NOTE — Telephone Encounter (Signed)
 Order placed and sent for the pt

## 2024-06-09 ENCOUNTER — Ambulatory Visit: Admitting: Adult Health

## 2024-06-09 VITALS — BP 119/77 | HR 77

## 2024-06-09 DIAGNOSIS — G43709 Chronic migraine without aura, not intractable, without status migrainosus: Secondary | ICD-10-CM | POA: Diagnosis not present

## 2024-06-09 MED ORDER — ONABOTULINUMTOXINA 200 UNITS IJ SOLR
155.0000 [IU] | Freq: Once | INTRAMUSCULAR | Status: AC
  Administered 2024-06-09: 155 [IU] via INTRAMUSCULAR

## 2024-06-09 NOTE — Progress Notes (Signed)
 Botox - 200 units x 1 vial Lot: [id number] Expiration: 11/2026 NDC: 9976-6078-97   Bacteriostatic 0.9% Sodium Chloride - 4mL total Onu:[id number] Expiration: 07/26/24 NDC: 9590-8033-97   Dx: H56.290 SP    Witnessed by: Catheryn RN

## 2024-06-09 NOTE — Progress Notes (Signed)
 Update 06/09/2024 JM: Patient is being seen for repeat Botox  injection, prior injection 03/17/2024. Reports continued benefit with botox , typically about 4/month which is >50% improvement of migraine headaches. Can have some wearing off 2 weeks prior to next injection. 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 Tolerated procedure well today.  Return in 3 months for repeat injection.        Consent Form Botulism Toxin Injection For Chronic Migraine    Reviewed orally with patient, additionally signature is on file:  Botulism toxin has been approved by the Federal drug administration for treatment of chronic migraine. Botulism toxin does not cure chronic migraine and it may not be effective in some patients.  The administration of botulism toxin is accomplished by injecting a small amount of toxin into the muscles of the neck and head. Dosage must be titrated for each individual. Any benefits resulting from botulism toxin tend to wear off after 3 months with a repeat injection required if benefit is to be maintained. Injections are usually done every 3-4 months with maximum effect peak achieved by about 2 or 3 weeks. Botulism toxin is expensive and you should be sure of what costs you will incur resulting from the injection.  The side effects of botulism toxin use for chronic migraine may include:   -Transient, and usually mild, facial weakness with facial injections  -Transient, and usually mild, head or neck weakness with head/neck injections  -Reduction or loss of forehead facial animation due to forehead muscle weakness  -Eyelid drooping  -Dry eye  -Pain at the site of injection or bruising at the site of injection  -Double vision  -Potential unknown long term risks   Contraindications: You should not have Botox  if you are pregnant, nursing, allergic to albumin, have an infection, skin condition, or muscle weakness at the site of the injection, or have myasthenia  gravis, Lambert-Eaton syndrome, or ALS.  It is also possible that as with any injection, there may be an allergic reaction or no effect from the medication. Reduced effectiveness after repeated injections is sometimes seen and rarely infection at the injection site may occur. All care will be taken to prevent these side effects. If therapy is given over a long time, atrophy and wasting in the muscle injected may occur. Occasionally the patient's become refractory to treatment because they develop antibodies to the toxin. In this event, therapy needs to be modified.  I have read the above information and consent to the administration of botulism toxin.    BOTOX  PROCEDURE NOTE FOR MIGRAINE HEADACHE  Contraindications and precautions discussed with patient(above). Aseptic procedure was observed and patient tolerated procedure. Procedure performed by Harlene Bogaert, AGNP-BC.   The condition has existed for more than 6 months, and pt does not have a diagnosis of ALS, Myasthenia Gravis or Lambert-Eaton Syndrome.  Risks and benefits of injections discussed and pt agrees to proceed with the procedure.  Written consent obtained  These injections are medically necessary. Pt  receives good benefits from these injections. These injections do not cause sedations or hallucinations which the oral therapies may cause.   Description of procedure:  The patient was placed in a sitting position. The standard protocol was used for Botox  as follows, with 5 units of Botox  injected at each site:  -Procerus muscle, midline injection  -Corrugator muscle, bilateral injection  -Frontalis muscle, bilateral injection, with 2 sites each side, medial injection was performed in the upper one third of the frontalis  muscle, in the region vertical from the medial inferior edge of the superior orbital rim. The lateral injection was again in the upper one third of the forehead vertically above the lateral limbus of the cornea,  1.5 cm lateral to the medial injection site.  -Temporalis muscle injection, 4 sites, bilaterally. The first injection was 3 cm above the tragus of the ear, second injection site was 1.5 cm to 3 cm up from the first injection site in line with the tragus of the ear. The third injection site was 1.5-3 cm forward between the first 2 injection sites. The fourth injection site was 1.5 cm posterior to the second injection site. 5th site laterally in the temporalis  muscleat the level of the outer canthus.  -Occipitalis muscle injection, 3 sites, bilaterally. The first injection was done one half way between the occipital protuberance and the tip of the mastoid process behind the ear. The second injection site was done lateral and superior to the first, 1 fingerbreadth from the first injection. The third injection site was 1 fingerbreadth superiorly and medially from the first injection site.  -Cervical paraspinal muscle injection, 2 sites, bilaterally. The first injection site was 1 cm from the midline of the cervical spine, 3 cm inferior to the lower border of the occipital protuberance. The second injection site was 1.5 cm superiorly and laterally to the first injection site.  -Trapezius muscle injection was performed at 3 sites, bilaterally. The first injection site was in the upper trapezius muscle halfway between the inflection point of the neck, and the acromion. The second injection site was one half way between the acromion and the first injection site. The third injection was done between the first injection site and the inflection point of the neck.    A total of 200 units of Botox  was prepared, 155 units of Botox  was injected as documented above, any Botox  not injected was wasted. The patient tolerated the procedure well, there were no complications of the above procedure.   Harlene Bogaert, AGNP-BC  [facility] [address] [place], [place] [address]  Phone  [phone] Fax [phone] Note: This document was prepared with digital dictation and possible smart phrase technology. Any transcriptional errors that result from this process are unintentional.

## 2024-07-22 ENCOUNTER — Other Ambulatory Visit: Payer: Self-pay

## 2024-07-24 ENCOUNTER — Ambulatory Visit: Payer: Self-pay | Admitting: Gastroenterology

## 2024-07-24 ENCOUNTER — Ambulatory Visit: Admitting: Gastroenterology

## 2024-07-24 ENCOUNTER — Other Ambulatory Visit (HOSPITAL_COMMUNITY): Payer: Self-pay | Admitting: Gastroenterology

## 2024-07-24 ENCOUNTER — Encounter: Payer: Self-pay | Admitting: Gastroenterology

## 2024-07-24 ENCOUNTER — Other Ambulatory Visit (INDEPENDENT_AMBULATORY_CARE_PROVIDER_SITE_OTHER)

## 2024-07-24 VITALS — BP 126/82 | HR 93 | Ht 60.0 in | Wt 142.0 lb

## 2024-07-24 DIAGNOSIS — R1013 Epigastric pain: Secondary | ICD-10-CM | POA: Diagnosis not present

## 2024-07-24 DIAGNOSIS — R1319 Other dysphagia: Secondary | ICD-10-CM | POA: Diagnosis not present

## 2024-07-24 DIAGNOSIS — R1312 Dysphagia, oropharyngeal phase: Secondary | ICD-10-CM | POA: Diagnosis not present

## 2024-07-24 DIAGNOSIS — K219 Gastro-esophageal reflux disease without esophagitis: Secondary | ICD-10-CM

## 2024-07-24 DIAGNOSIS — R6881 Early satiety: Secondary | ICD-10-CM

## 2024-07-24 DIAGNOSIS — R1310 Dysphagia, unspecified: Secondary | ICD-10-CM

## 2024-07-24 LAB — CBC WITH DIFFERENTIAL/PLATELET
Basophils Absolute: 0 K/uL (ref 0.0–0.1)
Basophils Relative: 0.6 % (ref 0.0–3.0)
Eosinophils Absolute: 0 K/uL (ref 0.0–0.7)
Eosinophils Relative: 0.7 % (ref 0.0–5.0)
HCT: 48.5 % — ABNORMAL HIGH (ref 36.0–46.0)
Hemoglobin: 16.4 g/dL — ABNORMAL HIGH (ref 12.0–15.0)
Lymphocytes Relative: 29.1 % (ref 12.0–46.0)
Lymphs Abs: 2 K/uL (ref 0.7–4.0)
MCHC: 33.8 g/dL (ref 30.0–36.0)
MCV: 87.8 fl (ref 78.0–100.0)
Monocytes Absolute: 0.5 K/uL (ref 0.1–1.0)
Monocytes Relative: 7.9 % (ref 3.0–12.0)
Neutro Abs: 4.3 K/uL (ref 1.4–7.7)
Neutrophils Relative %: 61.7 % (ref 43.0–77.0)
Platelets: 345 K/uL (ref 150.0–400.0)
RBC: 5.52 Mil/uL — ABNORMAL HIGH (ref 3.87–5.11)
RDW: 12.8 % (ref 11.5–15.5)
WBC: 7 K/uL (ref 4.0–10.5)

## 2024-07-24 LAB — COMPREHENSIVE METABOLIC PANEL WITH GFR
ALT: 37 U/L — ABNORMAL HIGH (ref 0–35)
AST: 22 U/L (ref 0–37)
Albumin: 4.8 g/dL (ref 3.5–5.2)
Alkaline Phosphatase: 53 U/L (ref 39–117)
BUN: 16 mg/dL (ref 6–23)
CO2: 29 meq/L (ref 19–32)
Calcium: 9.6 mg/dL (ref 8.4–10.5)
Chloride: 100 meq/L (ref 96–112)
Creatinine, Ser: 0.84 mg/dL (ref 0.40–1.20)
GFR: 81.24 mL/min (ref >60.00)
Glucose, Bld: 103 mg/dL — ABNORMAL HIGH (ref 70–99)
Potassium: 4.2 meq/L (ref 3.5–5.1)
Sodium: 137 meq/L (ref 135–145)
Total Bilirubin: 1 mg/dL (ref 0.2–1.2)
Total Protein: 7.7 g/dL (ref 6.0–8.3)

## 2024-07-24 LAB — LIPASE: Lipase: 47 U/L (ref 11.0–59.0)

## 2024-07-24 MED ORDER — PANTOPRAZOLE SODIUM 40 MG PO TBEC: 40.0000 mg | DELAYED_RELEASE_TABLET | Freq: Every day | ORAL | 3 refills | Status: AC

## 2024-07-24 NOTE — Patient Instructions (Addendum)
 Your provider has requested that you go to the basement level for lab work before leaving today. Press B on the elevator. The lab is located at the first door on the left as you exit the elevator.   We have sent the following medications to your pharmacy for you to pick up at your convenience: Protonix 40 mg daily  You have been scheduled for a modified barium swallow on 08/12/24 at 11 am. Please arrive 15 minutes prior to your test for registration. You will go to [facility] (Main Entrance A) for your appointment. Should you need to cancel or reschedule your appointment, please contact [phone] [facility] [place]) or [phone] Geroge Long). _____________________________________________________________________ A Modified Barium Swallow Study, or MBS, is a special x-ray that is taken to check swallowing skills. It is carried out by a Marine Scientist and a Warehouse Manager (SLP). During this test, yourmouth, throat, and esophagus, a muscular tube which connects your mouth to your stomach, is checked. The test will help you, your doctor, and the SLP plan what types of foods and liquids are easier for you to swallow. The SLP will also identify positions and ways to help you swallow more easily and safely. What will happen during an MBS? You will be taken to an x-ray room and seated comfortably. You will be asked to swallow small amounts of food and liquid mixed with barium. Barium is a liquid or paste that allows images of your mouth, throat and esophagus to be seen on x-ray. The x-ray captures moving images of the food you are swallowing as it travels from your mouth through your throat and into your esophagus. This test helps identify whether food or liquid is entering your lungs (aspiration). The test also shows which part of your mouth or throat lacks strength or coordination to move the food or liquid in the right direction. This test typically takes 30 minutes to 1 hour to  complete. _______________________________________________________________________  Whitney Trujillo have been scheduled for an endoscopy. Please follow written instructions given to you at your visit today.  If you use inhalers (even only as needed), please bring them with you on the day of your procedure.  If you take any of the following medications, they will need to be adjusted prior to your procedure:   DO NOT TAKE 7 DAYS PRIOR TO TEST- Trulicity (dulaglutide) Ozempic, Wegovy (semaglutide) Mounjaro (tirzepatide) Bydureon Bcise (exanatide extended release)  DO NOT TAKE 1 DAY PRIOR TO YOUR TEST Rybelsus (semaglutide) Adlyxin (lixisenatide) Victoza (liraglutide) Byetta (exanatide) ___________________________________________________________________________

## 2024-07-24 NOTE — Progress Notes (Signed)
 Whitney Trujillo [id number] 02-Apr-1976   Chief Complaint: GERD, trouble swallowing, abdominal pain  Referring Provider: Almarie Waddell NOVAK, NP Primary GI MD: Dr. [place]  HPI: Whitney Trujillo is a 48 y.o. female with past medical history of ADD, anxiety/depression, arthritis, pulmonary embolism after hysterectomy, HLD, HTN, cholecystectomy who presents today for a complaint of dysphagia, abdominal pain, and GERD.    Discussed the use of AI scribe software for clinical note transcription with the patient, who gave verbal consent to proceed.  History of Present Illness Whitney Trujillo is a 48 year old female who presents with difficulty swallowing and episodes of food getting stuck.  She has been experiencing dysphagia with a sensation of food impaction for about six months.  Is also had episodes where she swallows liquids and they go down her airway causing sensation of choking, with some relief when lying down.  There is a constant pressure in her throat and frequent throat clearing, present for one to two years.  Her symptoms have been increasing in frequency and severity, with early satiety during meals. She experiences heartburn and reflux nearly every time she eats, without relief from over-the-counter Pepcid, which she discontinued. She has not tried other medications like Prilosec or Nexium. She sometimes regurgitates food or finds relief by lying down when food feels stuck. Odynophagia occurs with solids, but not liquids, and she sometimes feels like she has aspirated, particularly with liquids or saliva.  She has a history of an epigastric hernia and suspects she may have a hiatal hernia. No history of pneumonia, cardiac issues, or recent abdominal imaging. No unintentional weight loss, but decreased appetite. New symptom of morning nausea if the stomach is empty. No fever, chills, melena, or changes in bowel movements. Family history includes colon cancer, but no known  family history of esophageal or gastric cancer.   Previous GI Procedures/Imaging   Colonoscopy 05/08/2022 - The examined portion of the ileum was normal.  - The entire examined colon is normal on direct and retroflexion views.  - No specimens collected. - Recall 5 years (family history of colon cancer in FDR <60)   Past Medical History:  Diagnosis Date   Acute pulmonary embolism  5 wks post hysterectomy 07/07/2014   Acute right lower quadrant pain 01/25/2016   ADD (attention deficit disorder) 02/02/2015   Allergy    seasonal allergies   Anxiety    Arthritis    Bilateral occipital neuralgia 05/04/2020   Colon cancer screening 12/16/2015   Depression with anxiety 03/15/2016   Endometriosis    Endometriosis 07/07/2014   S/p TAH b/l SPO     Fibromyalgia    Flank pain 05/03/2021   Gallstones    Gross hematuria 05/03/2021   History of COVID-19 02/24/2020   Hyperlipidemia, mixed 02/02/2015   Left-sided weakness 02/03/2022   Migraine 12/16/2015   Osteoarthritis    Preventative health care 12/16/2015   Primary hypertension 12/27/2023   Pulmonary emboli (HCC)    b/l PE after hysterectomy, 5 weeks post op, clotting problem ruled out at [facility]    Past Surgical History:  Procedure Laterality Date   ABDOMINAL HYSTERECTOMY  2010   1- ovary then second ovary ovary later   CESAREAN SECTION  2007, 2009   CHOLECYSTECTOMY  2015   CYSTOSCOPY  10/2014   EYE SURGERY     lasik b/l   HERNIA REPAIR     LAPAROTOMY     Endometriosis   OOPHORECTOMY  2013  TUBAL LIGATION     UMBILICAL HERNIA REPAIR  2019   also epigastric hernia removed at the same time per pt   WISDOM TOOTH EXTRACTION      Current Outpatient Medications  Medication Sig Dispense Refill   botulinum toxin Type A  (BOTOX ) 200 units injection Inject 155 u into the face and neck muscles. 1 each 3   DULoxetine  (CYMBALTA ) 30 MG capsule Take 2 capsules (60 mg total) by mouth daily. 180 capsule 1   Dupilumab (DUPIXENT) 300  MG/2ML SOPN Inject 300 mg into the skin every 14 (fourteen) days. 3.92 mL    estradiol  (VIVELLE -DOT) 0.025 MG/24HR Place 1 patch onto the skin 2 (two) times a week.     gabapentin  (NEURONTIN ) 100 MG capsule Take 1 capsule (100 mg total) by mouth 3 (three) times daily as needed (pain). 20 capsule 0   methocarbamol (ROBAXIN) 500 MG tablet Take 500 mg by mouth 2 (two) times daily as needed for muscle spasms.     Multiple Vitamin (MULTIVITAMIN) LIQD Take 5 mLs by mouth daily.     OVER THE COUNTER MEDICATION Take 1 Dose by mouth daily at 8 pm. 3 tsp powder consisting of: Magnesium carbonate 320 mg, vitamin C 265 mg, vitamin D  3 mcg, calcium 210 mg, potassium 110 mg     Triamcinolone  Acetonide (NASACORT  AQ NA) Place 1 spray into both nostrils daily as needed (allergies).     triamcinolone  ointment (KENALOG ) 0.1 % Apply 1 application. topically 2 (two) times daily as needed (rash).     hydrochlorothiazide  (HYDRODIURIL ) 12.5 MG tablet Take 1 tablet (12.5 mg total) by mouth daily. (Patient not taking: Reported on 07/24/2024) 90 tablet 3   Current Facility-Administered Medications  Medication Dose Route Frequency Provider Last Rate Last Admin   0.9 %  sodium chloride  infusion  500 mL Intravenous Once Pyrtle, Gordy HERO, MD        Allergies as of 07/24/2024 - Review Complete 07/24/2024  Allergen Reaction Noted   Latex Itching 03/13/2013   Gluten meal Other (See Comments) 03/13/2013    Family History  Problem Relation Age of Onset   Diabetes Mother        2, diet controlled   Anxiety disorder Mother    Arthritis Mother    Depression Mother    Hyperlipidemia Mother    Hypertension Mother    Colon cancer Father        age 4   Cancer Father        Carcinoid Syndrome, colon   Headache Father    Early death Father    Ovarian cancer Maternal Aunt    Heart disease Maternal Grandmother    Stroke Paternal Grandmother    Dementia Paternal Grandmother    Varicose Veins Paternal Grandmother    COPD  Paternal Grandfather        black lun, forensic scientist, tobacco   ADD / ADHD Son    Colon polyps Neg Hx    Esophageal cancer Neg Hx    Stomach cancer Neg Hx    Rectal cancer Neg Hx     Social History   Tobacco Use   Smoking status: Never   Smokeless tobacco: Never  Vaping Use   Vaping status: Never Used  Substance Use Topics   Alcohol use: Yes    Comment: 1-2 month   Drug use: Never     Review of Systems:    Constitutional: No weight loss, fever, chills Cardiovascular: No chest pain Respiratory: No SOB Gastrointestinal:  See HPI and otherwise negative   Physical Exam:  Vital signs: BP 126/82   Pulse 93   Ht 5' (1.524 m)   Wt 142 lb (64.4 kg)   SpO2 98%   BMI 27.73 kg/m   Wt Readings from Last 3 Encounters:  07/24/24 142 lb (64.4 kg)  05/08/24 141 lb 4 oz (64.1 kg)  03/10/24 136 lb (61.7 kg)     Constitutional: Pleasant, well-appearing female in NAD, alert and cooperative Head:  Normocephalic and atraumatic.  Eyes: No scleral icterus.  Respiratory: Respirations even and unlabored. Lungs clear to auscultation bilaterally.  No wheezes, crackles, or rhonchi.  Cardiovascular:  Regular rate and rhythm. No murmurs. No peripheral edema. Gastrointestinal:  Soft, nondistended, mild tenderness to palpation of epigastrium. No rebound or guarding. Normal bowel sounds. No appreciable masses or hepatomegaly. Rectal:  Not performed.  Neurologic:  Alert and oriented x4;  grossly normal neurologically.  Skin:   Dry and intact without significant lesions or rashes. Psychiatric: Oriented to person, place and time. Demonstrates good judgement and reason without abnormal affect or behaviors.   RELEVANT LABS AND IMAGING: CBC    Component Value Date/Time   WBC 8.0 01/01/2024 0722   RBC 5.39 (H) 01/01/2024 0722   HGB 16.4 (H) 01/01/2024 0722   HCT 49.0 (H) 01/01/2024 0722   PLT 340.0 01/01/2024 0722   MCV 90.8 01/01/2024 0722   MCH 30.1 02/03/2022 1115   MCHC 33.4 01/01/2024 0722    RDW 13.0 01/01/2024 0722   LYMPHSABS 2.3 01/01/2024 0722   MONOABS 0.6 01/01/2024 0722   EOSABS 0.1 01/01/2024 0722   BASOSABS 0.0 01/01/2024 0722    CMP     Component Value Date/Time   NA 136 05/08/2024 1619   NA 137 12/11/2022 0000   K 4.1 05/08/2024 1619   CL 97 05/08/2024 1619   CO2 29 05/08/2024 1619   GLUCOSE 96 05/08/2024 1619   BUN 15 05/08/2024 1619   BUN 17 12/11/2022 0000   CREATININE 0.84 05/08/2024 1619   CREATININE 0.95 07/22/2020 0949   CALCIUM 9.5 05/08/2024 1619   PROT 6.9 01/01/2024 0722   PROT 7.0 02/13/2022 0942   ALBUMIN 4.7 01/01/2024 0722   AST 21 01/01/2024 0722   ALT 32 01/01/2024 0722   ALKPHOS 51 01/01/2024 0722   BILITOT 1.3 (H) 01/01/2024 0722   GFRNONAA 97 12/11/2022 1439   GFRNONAA >89 11/24/2014 0933   GFRAA >60 03/07/2020 2205   GFRAA >89 11/24/2014 0933     Assessment/Plan:   Dysphagia Epigastric abdominal pain GERD Patient seen today for complaint of dysphagia, epigastric pain, and GERD.  For about 6 months has a sensation that food gets stuck when swallowing, at suprasternal notch.  Has also had about 5 episodes of possible aspiration where she swallows liquid and subsequently feels like she is choking.  This is relieved if she lays down.  Symptoms increasing in frequency and severity, has developed early satiety with meals. Has also been having increased heartburn and reflux which now occurs nearly every time she eats, without relief from OTC Pepcid.  Has not tried a PPI.  Reports having a globus sensation and frequent throat clearing present for 1 to 2 years. No prior EGD.  Last colonoscopy 2023 and has family history of colon cancer in first-degree relative.  Denies family history of esophageal or stomach cancer.  Differential includes GERD, esophagitis/gastritis, esophageal dysmotility, esophageal stricture, malignancy.  Will check a lipase due to epigastric pain with mild tenderness to palpation  on exam today and if elevated  consider CT abdomen to evaluate the pancreas.  - Order modified barium swallow due to concern for possible aspiration - Order EGD with possible dilation for GERD and dysphagia - Labs today: CBC, CMP, lipase - Consider cross-sectional imaging - Start Protonix 40 mg daily - Follow-up after procedure   Whitney Furbish, PA-C [facility] 07/24/2024, 11:29 AM  Patient Care Team: Almarie Waddell NOVAK, NP as PCP - General (Family Medicine) Teresa Manuelita RAMAN., MD (Obstetrics and Gynecology) Veryl Charleston, MD as Referring Physician (Dermatology) Buck Saucer, MD as Attending Physician (Neurology)

## 2024-07-26 ENCOUNTER — Encounter: Payer: Self-pay | Admitting: Gastroenterology

## 2024-08-07 ENCOUNTER — Ambulatory Visit: Admitting: Family Medicine

## 2024-08-08 ENCOUNTER — Telehealth (HOSPITAL_COMMUNITY): Payer: Self-pay | Admitting: Gastroenterology

## 2024-08-08 NOTE — Telephone Encounter (Signed)
 Patient called [facility] office 11/14 requesting insurance pre-auth info, she was directed to pt accounting dept, she called back stating her insur. would not cover the test and declined having it done at this time. Patient will need new op mbss order if she wishes to proceed (ah) // appt set-up via [facility]

## 2024-08-12 ENCOUNTER — Encounter (HOSPITAL_COMMUNITY)

## 2024-08-26 ENCOUNTER — Encounter: Payer: Self-pay | Admitting: Neurology

## 2024-08-26 ENCOUNTER — Ambulatory Visit: Admitting: Neurology

## 2024-08-26 VITALS — BP 123/69 | HR 75 | Ht 60.0 in | Wt 142.8 lb

## 2024-08-26 DIAGNOSIS — G43709 Chronic migraine without aura, not intractable, without status migrainosus: Secondary | ICD-10-CM | POA: Diagnosis not present

## 2024-08-26 NOTE — Progress Notes (Signed)
 Subjective:    Patient ID: Whitney Trujillo is a 48 y.o. female.  HPI   Interim history:   Whitney Trujillo is a 48 year old female with an underlying medical history of endometriosis, fibromyalgia, hyperlipidemia, osteoarthritis, hypertension, history of PE, ADD, allergy, anxiety, depression, and overweight state, who presents for follow-up consultation of her chronic migraines.  The patient is unaccompanied today and is transitioning care.  She previously saw Dr. Ines in this office and has also seen Whitney Bogaert, NP.  She has been getting Botox  injections through this office.  Her last injection was in September 2025 and she is scheduled to see Whitney for repeat injections in a couple of weeks.  I reviewed office visit notes from previous encounters and copied notes below for reference.   Today, 08/26/2024: She reports doing fairly well.  She has had no recent flare in her migraines.  Her neck arthritis and neck pain are often triggers for her migraines.  She is followed by orthopedics for this and has a prescription for a muscle relaxant and also for gabapentin  as needed.  She tries to hydrate well.  She drinks caffeine in the form of coffee, usually 1 to 3 cups/day.  She drinks alcohol rather infrequently, maybe a few times a month.  She is up-to-date with her eye examination and usually goes once a year, sees optometry in [facility].  She has prescription eyeglasses. She has tolerated her Botox  injections quite well and has benefited from treatment.  She had side effects from Emgality .  She does take quite a few supplements including magnesium, saffron, and a daily thyroid  supplement.   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  She denies any major sleep disorder, no history of snoring, no recurrent morning headaches, sleeps fairly well and wakes up fairly well rested.  The patient's allergies, current medications, family history, past medical  history, past social history, past surgical history and problem list were reviewed and updated as appropriate.   Previously:  06/09/2024 (JM): <<Patient is being seen for repeat Botox  injection, prior injection 03/17/2024. Reports continued benefit with botox , typically about 4/month which is >50% improvement of migraine headaches. Can have some wearing off 2 weeks prior to next injection. 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 Tolerated procedure well today.  Return in 3 months for repeat injection.>> 03/17/2024 Whitney Bogaert, NP): <<Patient is being seen for repeat Botox  injection, prior injection 12/24/2023 (initial injection).  Reports great improvement of migraines after initial injection, down to about 4/month which is >50% migraine reduction. Has had some increase migraines over the past few weeks as botox  wearing off. 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 Tolerated procedure well today.  Return in 3 months for repeat injection.   >> 12/24/2023 (JM:) <<Patient is being seen for initial Botox  injection.  She is currently experiencing about 10 migraines per month which can last up to a couple days.  Emgality  discontinued due to constipation, last injection 1 month ago.  She currently sees Botox  for TMJ through dentist.  Tolerated procedure well today.  Return in 3 months for repeat injection.>>  07/03/2023 (Dr. Ines): <<Here for headaches, we have seen her in the past for other multiple concerns. She is still having headaches. She has had headaches all her life, she has taken different medications in the past for her migraines she has had them for a very long time she will get them after  an orgasm and it is painful and intense and she took her blood pressure afterwards and it was 170s. Today was 127/87 today. Usually her blood pressure is very low. She will have 8-10 migraine days a month and > 15 total headache days a month. Ongoing for over a year and she has been  treated with multiple medications, she also says she has fibromyalgia, she grinds her teeth and clench, she will break teeth and got invisilign, the dentist is going to consider botox  in the masseters for her discussed we cannot do that.    From a review of records, medications tried that can be used in migraine or headache management include: Tylenol , Celebrex, Flexeril , Cymbalta , Lexapro , Prozac , gabapentin , ibuprofen, Ativan , meloxicam , Robaxin, Zofran , Maxalt , imitrex, tramadol , Effexor , amitriptyline and nortriptyline and the tricyclic antidepressant class is contraindicated as patient is already on an SSRI and the combination can cause serotonergic syndrome. Aimovig is contraindicated due to constipation. Propranolol  caused shortness of breath, medication discontinued due to side effects.   Patient complains of symptoms per HPI as well as the following symptoms: neck pain . Pertinent negatives and positives per HPI. All others negative       Follow up 02/13/2022: She comes with a list today of concerns. She has a lot of tightness in the neck, she was in the emergency room last weekend she saw a rheumatologist, she wakes up stiff every morning, random muscles, it moves around, recently her calves and the neck is better.  So symptoms started in May 2021 after COVID, from then on neck pain and back pain for which we have seen patient for in the past in January 2023 started scuffing or tripping on the left foot, heaviness in the left leg, in March fell had whiplash numbness in the face now week heavy feeling leg and some decreased range of motion, 2 weeks ago started feeli feeling tingling and numbness in the left leg, torso, face throat arm chest cramping and muscle spasms with numbness and tingling, randomly occurs in different parts of the body and times, hands feet get cold randomly at time of rash. Te rsah is across the front of the face, she has rashes on her body, today she appears as though she has  a rash Maller rash and rash on the forehead, she shows me some pictures where she has several rashes on the abdomen on the forehead on the neck in different places,she saw a rheumatologist, just did not feel as if she had psoriatic arthritis that it might of been eczema, started Dupixent, the rash improved but now has more of a rash on her face, wakes up stiff, has had an MRI of the brain and the cervical spine right, EMG of the upper extremities. Stiffness, joint pain, rashes, muscle spasms, no weakness, cold and hot extremities can be left or right, no numbness or tingling currently (has ulnar neuropathy. Also family history in mother of these symptoms.    Patient complains of symptoms per HPI as well as the following symptoms: rashes, joint pain . Pertinent negatives and positives per HPI. All others negative     HPI:  Whitney Trujillo is a 48 y.o. female here as requested by emergency room for headaches.  Past medical history migraines, hyperlipidemia, depression, anxiety, ADD.  I reviewed emergency room notes, patient had sudden onset headache during intercourse March 08, 2020 when she was seen in the emergency room it had improved, similar to previous migraine but more severe, has had  post ictal headaches in the past but never this severe, neurologic exam is nonfocal, CTA was reassuring without aneurysm, she declined lumbar puncture, her headache improved and she wished to go home, there was no evidence of aneurysm or subarachnoid hemorrhage but she was warned that about 1% of the time its not seen on CAT scan she still declined lumbar puncture.  She described it as a severe generalized headache and pain into her neck, not worsening on movement, no vision changes, vomiting or confusion, no numbness weakness or tingling into her arms or legs, she works as a interior and spatial designer, she has a history of blood clots, Tylenol  and migraine medication did not help and the pain was 9 out of 10.   She has had headache at  orgasm 4 times, this last one was very severe 03/07/2020, 9/10 pain, CTA was negative and she declined LP, since then she saw an orthopaedist because her neck and back always hurt she had an MRI cervical spine and was started on Gabapentin  which has helped her headaches. She was sent to Rheumatology for joint pain to evaluate for RF or other but hasn;t gone yet (she has psoriasis), She has a history of migraines especially with her cycles, pusating/pounding, light and sound sensitivity, nausea, vomiting, has had them many years stable 2x a month, maxalt  heps with the migraines may have to take it twice, taking gabapentin  1-2x a day and has helped with the migraines, no exertional headaches since June. 2 migraines a month and rarely coital headache. She has had a few sharp pains in the morning when she is standing and getting ready and she leans over a little bit, positional. When the headache happens during sex it starts in the back of the head and radiates and shoots up the head of the back of the eye.No other focal neurologic deficits, associated symptoms, inciting events or modifiable factors.       Reviewed notes, labs and imaging from outside physicians, which showed:   Personally revewed images and agree with the following   Brain: There is no mass, hemorrhage or extra-axial collection. The size and configuration of the ventricles and extra-axial CSF spaces are normal. There is no acute or chronic infarction. The brain parenchyma is normal.   Skull: The visualized skull base, calvarium and extracranial soft tissues are normal.   Sinuses/Orbits: No fluid levels or advanced mucosal thickening of the visualized paranasal sinuses. No mastoid or middle ear effusion. The orbits are normal.   CTA NECK FINDINGS   SKELETON: There is no bony spinal canal stenosis. No lytic or blastic lesion.   OTHER NECK: Normal pharynx, larynx and major salivary glands. No cervical lymphadenopathy. Unremarkable  thyroid  gland.   UPPER CHEST: No pneumothorax or pleural effusion. No nodules or masses.   AORTIC ARCH:   There is no calcific atherosclerosis of the aortic arch. There is no aneurysm, dissection or hemodynamically significant stenosis of the visualized portion of the aorta. Conventional 3 vessel aortic branching pattern. The visualized proximal subclavian arteries are widely patent.   RIGHT CAROTID SYSTEM: Normal without aneurysm, dissection or stenosis.   LEFT CAROTID SYSTEM: Normal without aneurysm, dissection or stenosis.   VERTEBRAL ARTERIES: Left dominant configuration. Both origins are clearly patent. There is no dissection, occlusion or flow-limiting stenosis to the skull base (V1-V3 segments).   CTA HEAD FINDINGS   POSTERIOR CIRCULATION:   --Vertebral arteries: Normal V4 segments.   --Inferior cerebellar arteries: Normal.   --Basilar artery: Normal.   --Superior cerebellar  arteries: Normal.   --Posterior cerebral arteries (PCA): Normal.   ANTERIOR CIRCULATION:   --Intracranial internal carotid arteries: Normal.   --Anterior cerebral arteries (ACA): Normal. Both A1 segments are present. Patent anterior communicating artery (a-comm).   --Middle cerebral arteries (MCA): Normal.   VENOUS SINUSES: As permitted by contrast timing, patent.   ANATOMIC VARIANTS: None   Review of the MIP images confirms the above findings.   IMPRESSION: Normal CTA of the head and neck.   Reviewed MRI of the cervical spine report April 12, 2020 which showed severe left C2-C3 neural foraminal narrowing, impinging upon the exiting left C3 nerve roots, due to severe left uncovertebral osteophytosis and severe left facet arthrosis, moderate right C7-T1 neuroforaminal narrowing due to severe right facet arthrosis, mild left C5-C6 neural foraminal narrowing, partially visualized moderate broad central T3-T4 disc osteophyte complex with mild/moderate deformity of central cord without  spinal canal stenosis, straightening of the normal cervical lordosis.     >> Her Past Medical History Is Significant For: Past Medical History:  Diagnosis Date   Acute pulmonary embolism  5 wks post hysterectomy 07/07/2014   Acute right lower quadrant pain 01/25/2016   ADD (attention deficit disorder) 02/02/2015   Allergy    seasonal allergies   Anxiety    Arthritis    Bilateral occipital neuralgia 05/04/2020   Colon cancer screening 12/16/2015   Depression with anxiety 03/15/2016   Endometriosis    Endometriosis 07/07/2014   S/p TAH b/l SPO     Fibromyalgia    Flank pain 05/03/2021   Gallstones    Gross hematuria 05/03/2021   History of COVID-19 02/24/2020   Hyperlipidemia, mixed 02/02/2015   Left-sided weakness 02/03/2022   Migraine 12/16/2015   Osteoarthritis    Preventative health care 12/16/2015   Primary hypertension 12/27/2023   Pulmonary emboli (HCC)    b/l PE after hysterectomy, 5 weeks post op, clotting problem ruled out at [facility]    Her Past Surgical History Is Significant For: Past Surgical History:  Procedure Laterality Date   ABDOMINAL HYSTERECTOMY  2010   1- ovary then second ovary ovary later   CESAREAN SECTION  2007, 2009   CHOLECYSTECTOMY  2015   CYSTOSCOPY  10/2014   EYE SURGERY     lasik b/l   HERNIA REPAIR     LAPAROTOMY     Endometriosis   OOPHORECTOMY  2013   TUBAL LIGATION     UMBILICAL HERNIA REPAIR  2019   also epigastric hernia removed at the same time per pt   WISDOM TOOTH EXTRACTION      Her Family History Is Significant For: Family History  Problem Relation Age of Onset   Diabetes Mother        2, diet controlled   Anxiety disorder Mother    Arthritis Mother    Depression Mother    Hyperlipidemia Mother    Hypertension Mother    Migraines Mother    Colon cancer Father        age 31   Cancer Father        Carcinoid Syndrome, colon   Headache Father    Early death Father    Migraines Father    Ovarian cancer Maternal  Aunt    Heart disease Maternal Grandmother    Stroke Paternal Grandmother    Dementia Paternal Grandmother    Varicose Veins Paternal Grandmother    COPD Paternal Grandfather        black lun, forensic scientist, tobacco  ADD / ADHD Son    Colon polyps Neg Hx    Esophageal cancer Neg Hx    Stomach cancer Neg Hx    Rectal cancer Neg Hx     Her Social History Is Significant For: Social History   Socioeconomic History   Marital status: Married    Spouse name: Not on file   Number of children: Not on file   Years of education: Not on file   Highest education level: Associate degree: occupational, scientist, product/process development, or vocational program  Occupational History   Not on file  Tobacco Use   Smoking status: Never   Smokeless tobacco: Never  Vaping Use   Vaping status: Never Used  Substance and Sexual Activity   Alcohol use: Yes    Comment: 1-2 month   Drug use: Never   Sexual activity: Yes    Partners: Male    Birth control/protection: Surgical    Comment: lives with husband (ADD) and children, 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  Other Topics Concern   Not on file  Social History Narrative   Lives at home with husband and 2 children   Right handed   Caffeine: 2-3 cups/day   Social Drivers of Corporate Investment Banker Strain: Low Risk  (12/11/2023)   Overall Financial Resource Strain (CARDIA)    Difficulty of Paying Living Expenses: Not hard at all  Food Insecurity: No Food Insecurity (12/11/2023)   Hunger Vital Sign    Worried About Running Out of Food in the Last Year: Never true    Ran Out of Food in the Last Year: Never true  Transportation Needs: No Transportation Needs (12/11/2023)   PRAPARE - Administrator, Civil Service (Medical): No    Lack of Transportation (Non-Medical): No  Physical Activity: Insufficiently Active (12/11/2023)   Exercise Vital Sign    Days of Exercise per Week: 4 days    Minutes of Exercise per Session: 30 min  Stress: Stress  Concern Present (12/11/2023)   [employer] of Occupational Health - Occupational Stress Questionnaire    Feeling of Stress : To some extent  Social Connections: Moderately Integrated (12/11/2023)   Social Connection and Isolation Panel    Frequency of Communication with Friends and Family: Once a week    Frequency of Social Gatherings with Friends and Family: Once a week    Attends Religious Services: More than 4 times per year    Active Member of [employer] or Organizations: Yes    Attends Banker Meetings: 1 to 4 times per year    Marital Status: Married    Her Allergies Are:  Allergies  Allergen Reactions   Latex Itching   Gluten Meal Other (See Comments)    Cramping and diarrhea  :   Her Current Medications Are:  Outpatient Encounter Medications as of 08/26/2024  Medication Sig   botulinum toxin Type A  (BOTOX ) 200 units injection Inject 155 u into the face and neck muscles.   DULoxetine  (CYMBALTA ) 30 MG capsule Take 2 capsules (60 mg total) by mouth daily.   Dupilumab (DUPIXENT) 300 MG/2ML SOPN Inject 300 mg into the skin every 14 (fourteen) days.   estradiol  (VIVELLE -DOT) 0.025 MG/24HR Place 1 patch onto the skin 2 (two) times a week.   gabapentin  (NEURONTIN ) 100 MG capsule Take 1 capsule (100 mg total) by mouth 3 (three) times daily as needed (pain).   methocarbamol (ROBAXIN) 500 MG tablet Take 500 mg by mouth 2 (two) times  daily as needed for muscle spasms.   Multiple Vitamin (MULTIVITAMIN) LIQD Take 5 mLs by mouth daily.   OVER THE COUNTER MEDICATION Take 1 Dose by mouth daily at 8 pm. 3 tsp powder consisting of: Magnesium carbonate 320 mg, vitamin C 265 mg, vitamin D  3 mcg, calcium 210 mg, potassium 110 mg   pantoprazole  (PROTONIX ) 40 MG tablet Take 1 tablet (40 mg total) by mouth daily.   Triamcinolone  Acetonide (NASACORT  AQ NA) Place 1 spray into both nostrils daily as needed (allergies).   triamcinolone  ointment (KENALOG ) 0.1 % Apply 1 application. topically  2 (two) times daily as needed (rash).   hydrochlorothiazide  (HYDRODIURIL ) 12.5 MG tablet Take 1 tablet (12.5 mg total) by mouth daily. (Patient not taking: Reported on 08/26/2024)   Facility-Administered Encounter Medications as of 08/26/2024  Medication   0.9 %  sodium chloride  infusion  :  Review of Systems:  Out of a complete 14 point review of systems, all are reviewed and negative with the exception of these symptoms as listed below  Review of Systems  Objective:  Neurological Exam  Physical Exam Physical Examination:   Vitals:   08/26/24 1100  BP: 123/69  Pulse: 75    General Examination: The patient is a very pleasant 48 y.o. female in no acute distress. She appears well-developed and well-nourished and well groomed.   HEENT: Normocephalic, atraumatic, pupils are equal, round and reactive to light, extraocular tracking is good without limitation to gaze excursion or nystagmus noted. No photophobia.  No Corrective eye glasses in place. Hearing is grossly intact.  Face is symmetric with normal facial animation. Speech is clear without dysarthria. There is no hypophonia. There is no lip, neck/head, jaw or voice tremor. Neck is supple with full range of passive and active motion. There are no carotid bruits on auscultation.  Airway/Oropharynx exam reveals: mild mouth dryness, good dental hygiene and mild airway crowding, due to small airway entry.  Tongue protrudes centrally and palate elevates symmetrically.  Chest: Clear to auscultation without wheezing, rhonchi or crackles noted.  Heart: S1+S2+0, regular and normal without murmurs, rubs or gallops noted.   Abdomen: Soft, non-tender and non-distended.  Extremities: There is no pitting edema in the distal lower extremities bilaterally.   Skin: Warm and dry without trophic changes noted.   Musculoskeletal: exam reveals no obvious joint deformities.  No obvious arthritic changes in her hands and toes.  She has left hip  pain.  Neurologically:  Mental status: The patient is awake, alert and oriented in all 4 spheres. Her immediate and remote memory, attention, language skills and fund of knowledge are appropriate. There is no evidence of aphasia, agnosia, apraxia or anomia. Speech is clear with normal prosody and enunciation. Thought process is linear. Mood is normal and affect is normal.  Cranial nerves II - XII are as described above under HEENT exam.  Motor exam: Normal bulk, strength and tone is noted. There is no obvious action or resting tremor.  Fine motor skills and coordination: Intact with finger taps, hand movements and rapid alternating patting with both upper extremities, normal foot taps bilaterally in the lower extremities.   Reflexes 2+ throughout, toes are downgoing bilaterally.  Cerebellar testing: No dysmetria or intention tremor. There is no truncal or gait ataxia.  Normal finger-to-nose, heel-to-shin a little bit difficult with the left due to left hip pain. Sensory exam: intact to light touch in the upper and lower extremities.  Romberg negative. Gait, station and balance: She stands easily. No  veering to one side is noted. No leaning to one side is noted. Posture is age-appropriate and stance is narrow based. Gait shows normal stride length and normal pace. No problems turning are noted.  No normal tandem walk.  Assessment and Plan:  In summary, Whitney Trujillo is a very pleasant 48 y.o.-year old female with an underlying medical history of endometriosis, fibromyalgia, hyperlipidemia, osteoarthritis, hypertension, history of PE, ADD, allergy, anxiety, depression, and overweight state, who presents for follow-up consultation of her chronic migraines.  She is transitioning care from Dr. Ines.  Neurological exam is nonfocal.  She has tried and failed multiple medications, most recently she had side effects with Emgality .  She is on several supplements and has been on Botox  injections for the  past nearly 9 months with good success, she has had 3 injections thus far and is scheduled for this month.  She has tolerated the injections and is pleased with her results so far.  We talked about headache triggers again today.  She is reminded to limit her caffeine, try to get enough sleep, and stay well-hydrated, up-to-date with her eye examination.  She has chronic neck pain and is followed by orthopedics for this.  She is advised to follow-up routinely as scheduled for her regular Botox  injections every 90 days in this office with the nurse practitioner and we can make a follow-up appointment in clinic for reevaluation or additional management as needed.  I answered all her questions today and she was in agreement with our plan.   I spent 45 minutes in total face-to-face time and in reviewing records during pre-charting, more than 50% of which was spent in counseling and coordination of care, reviewing test results, reviewing medications and treatment regimen and/or in discussing or reviewing the diagnosis of chronic migraines, the prognosis and treatment options. Pertinent laboratory and imaging test results that were available during this visit with the patient were reviewed by me and considered in my medical decision making (see chart for details).

## 2024-08-29 ENCOUNTER — Encounter: Payer: Self-pay | Admitting: Internal Medicine

## 2024-09-05 ENCOUNTER — Ambulatory Visit: Admitting: Internal Medicine

## 2024-09-05 ENCOUNTER — Other Ambulatory Visit: Payer: Self-pay | Admitting: Family Medicine

## 2024-09-05 ENCOUNTER — Encounter: Payer: Self-pay | Admitting: Internal Medicine

## 2024-09-05 VITALS — BP 133/90 | HR 88 | Temp 97.3°F | Resp 18 | Ht 60.0 in | Wt 142.0 lb

## 2024-09-05 DIAGNOSIS — K2289 Other specified disease of esophagus: Secondary | ICD-10-CM

## 2024-09-05 DIAGNOSIS — R1310 Dysphagia, unspecified: Secondary | ICD-10-CM

## 2024-09-05 DIAGNOSIS — M5416 Radiculopathy, lumbar region: Secondary | ICD-10-CM

## 2024-09-05 DIAGNOSIS — R0989 Other specified symptoms and signs involving the circulatory and respiratory systems: Secondary | ICD-10-CM

## 2024-09-05 DIAGNOSIS — R1319 Other dysphagia: Secondary | ICD-10-CM

## 2024-09-05 DIAGNOSIS — R09A2 Foreign body sensation, throat: Secondary | ICD-10-CM

## 2024-09-05 MED ORDER — SODIUM CHLORIDE 0.9 % IV SOLN: 500.0000 mL | Freq: Once | INTRAVENOUS | Status: DC

## 2024-09-05 NOTE — Op Note (Signed)
 [facility] Patient Name: Whitney Trujillo Procedure Date: 09/05/2024 9:52 AM MRN: [id number] Endoscopist: Gordy CHRISTELLA Starch , MD, [id number] Age: 48 Referring MD:  Date of Birth: 1976-01-20 Gender: Female Account #: [id number] Procedure:                Upper GI endoscopy Indications:              Dysphagia, Globus sensation, throat clearing,                            pantoprazole  started late Oct with minimal                            improvement Medicines:                Monitored Anesthesia Care Procedure:                Pre-Anesthesia Assessment:                           - Prior to the procedure, a History and Physical                            was performed, and patient medications and                            allergies were reviewed. The patient's tolerance of                            previous anesthesia was also reviewed. The risks                            and benefits of the procedure and the sedation                            options and risks were discussed with the patient.                            All questions were answered, and informed consent                            was obtained. Prior Anticoagulants: The patient has                            taken no anticoagulant or antiplatelet agents. ASA                            Grade Assessment: II - A patient with mild systemic                            disease. After reviewing the risks and benefits,                            the patient was deemed in satisfactory condition to  undergo the procedure.                           After obtaining informed consent, the endoscope was                            passed under direct vision. Throughout the                            procedure, the patient's blood pressure, pulse, and                            oxygen saturations were monitored continuously. The                            GIF [id number] #[id number] was introduced through the                             mouth, and advanced to the second part of duodenum.                            The upper GI endoscopy was accomplished without                            difficulty. The patient tolerated the procedure                            well. Scope In: Scope Out: Findings:                 Normal mucosa was found in the entire esophagus.                            Biopsies were obtained from the proximal and distal                            esophagus with cold forceps for histology and to                            exclude eosinophilic esophagitis.                           No endoscopic abnormality was evident in the                            esophagus to explain the patient's complaint of                            dysphagia. It was decided, however, to proceed with                            dilation of the entire esophagus. The scope was                            withdrawn. Dilation was performed with  a Maloney                            dilator with mild resistance at 54 Fr. The dilation                            site was examined following endoscope reinsertion                            and showed no change.                           The entire examined stomach was normal.                           The examined duodenum was normal. Complications:            No immediate complications. Estimated Blood Loss:     Estimated blood loss was minimal. Impression:               - Normal mucosa was found in the entire esophagus.                           - No endoscopic esophageal abnormality to explain                            patient's dysphagia. Esophagus dilated with 54 Fr                            Maloney.                           - Normal stomach.                           - Normal examined duodenum.                           - Biopsies were taken with a cold forceps for                            evaluation of eosinophilic esophagitis. Recommendation:            - Patient has a contact number available for                            emergencies. The signs and symptoms of potential                            delayed complications were discussed with the                            patient. Return to normal activities tomorrow.                            Written discharge instructions were provided to the  patient.                           - Resume previous diet.                           - Continue present medications.                           - Await pathology results.                           - If no response to empiric dilation and biopsies                            unrevealing then barium esophagram is recommended                            to evaluate esophageal motility (manometry also to                            be considered based on these results)> Gordy CHRISTELLA Starch, MD 09/05/2024 10:20:55 AM This report has been signed electronically.

## 2024-09-05 NOTE — Progress Notes (Signed)
 GASTROENTEROLOGY PROCEDURE H&P NOTE   Primary Care Physician: Almarie Waddell NOVAK, NP    Reason for Procedure:  GERD type symptoms with esophageal dysphagia  Plan:    EGD with possible dilation  Patient is appropriate for endoscopic procedure(s) in the ambulatory ([facility]) setting.  The nature of the procedure, as well as the risks, benefits, and alternatives were carefully and thoroughly reviewed with the patient. Ample time for discussion and questions allowed.  All questions were answered. The patient understood, was satisfied, and agreed with the plan to proceed.    HPI: Whitney Trujillo is a 48 y.o. female who presents for EGD with possible dilation.  Medical history as below.   No recent chest pain or shortness of breath.  No abdominal pain today.  Past Medical History:  Diagnosis Date   Acute pulmonary embolism  5 wks post hysterectomy 07/07/2014   Acute right lower quadrant pain 01/25/2016   ADD (attention deficit disorder) 02/02/2015   Allergy    seasonal allergies   Anxiety    Arthritis    Bilateral occipital neuralgia 05/04/2020   Colon cancer screening 12/16/2015   Depression with anxiety 03/15/2016   Endometriosis    Endometriosis 07/07/2014   S/p TAH b/l SPO     Fibromyalgia    Flank pain 05/03/2021   Gallstones    Gross hematuria 05/03/2021   History of COVID-19 02/24/2020   Hyperlipidemia, mixed 02/02/2015   Left-sided weakness 02/03/2022   Migraine 12/16/2015   Osteoarthritis    Preventative health care 12/16/2015   Primary hypertension 12/27/2023   Pulmonary emboli (HCC)    b/l PE after hysterectomy, 5 weeks post op, clotting problem ruled out at [facility]    Past Surgical History:  Procedure Laterality Date   ABDOMINAL HYSTERECTOMY  2010   1- ovary then second ovary ovary later   CESAREAN SECTION  2007, 2009   CHOLECYSTECTOMY  2015   CYSTOSCOPY  10/2014   EYE SURGERY     lasik b/l   HERNIA REPAIR     LAPAROTOMY     Endometriosis    OOPHORECTOMY  2013   TUBAL LIGATION     UMBILICAL HERNIA REPAIR  2019   also epigastric hernia removed at the same time per pt   WISDOM TOOTH EXTRACTION      Prior to Admission medications  Medication Sig Start Date End Date Taking? Authorizing Provider  estradiol  (VIVELLE -DOT) 0.05 MG/24HR patch Place 1 patch onto the skin 2 (two) times a week. 08/30/24  Yes [provider]  Multiple Vitamin (MULTIVITAMIN) LIQD Take 5 mLs by mouth daily.   Yes [provider]  pantoprazole  (PROTONIX ) 40 MG tablet Take 1 tablet (40 mg total) by mouth daily. 07/24/24  Yes Heinz, Sara E, PA-C  triamcinolone  ointment (KENALOG ) 0.1 % Apply 1 application. topically 2 (two) times daily as needed (rash). 01/10/22  Yes [provider]  botulinum toxin Type A  (BOTOX ) 200 units injection Inject 155 u into the face and neck muscles. 05/13/24   Whitfield Raisin, NP  DULoxetine  (CYMBALTA ) 30 MG capsule Take 2 capsules (60 mg total) by mouth daily. Needs appt 09/05/24   Almarie Waddell NOVAK, NP  Dupilumab (DUPIXENT) 300 MG/2ML SOPN Inject 300 mg into the skin every 14 (fourteen) days. 05/09/21   Domenica Harlene LABOR, MD  gabapentin  (NEURONTIN ) 100 MG capsule Take 1 capsule (100 mg total) by mouth 3 (three) times daily as needed (pain). 02/04/22   Sebastian Toribio GAILS, MD  hydrochlorothiazide  (HYDRODIURIL )  12.5 MG tablet Take 1 tablet (12.5 mg total) by mouth daily. Patient not taking: Reported on 07/24/2024 12/27/23   Almarie Waddell NOVAK, NP  methocarbamol (ROBAXIN) 500 MG tablet Take 500 mg by mouth 2 (two) times daily as needed for muscle spasms. 12/27/21   [provider]  OVER THE COUNTER MEDICATION Take 1 Dose by mouth daily at 8 pm. 3 tsp powder consisting of: Magnesium carbonate 320 mg, vitamin C 265 mg, vitamin D  3 mcg, calcium 210 mg, potassium 110 mg    [provider]  Triamcinolone  Acetonide (NASACORT  AQ NA) Place 1 spray into both nostrils daily as needed (allergies).    [provider]     Current Outpatient Medications  Medication Sig Dispense Refill   estradiol  (VIVELLE -DOT) 0.05 MG/24HR patch Place 1 patch onto the skin 2 (two) times a week.     Multiple Vitamin (MULTIVITAMIN) LIQD Take 5 mLs by mouth daily.     pantoprazole  (PROTONIX ) 40 MG tablet Take 1 tablet (40 mg total) by mouth daily. 30 tablet 3   triamcinolone  ointment (KENALOG ) 0.1 % Apply 1 application. topically 2 (two) times daily as needed (rash).     botulinum toxin Type A  (BOTOX ) 200 units injection Inject 155 u into the face and neck muscles. 1 each 3   DULoxetine  (CYMBALTA ) 30 MG capsule Take 2 capsules (60 mg total) by mouth daily. Needs appt 60 capsule 0   Dupilumab (DUPIXENT) 300 MG/2ML SOPN Inject 300 mg into the skin every 14 (fourteen) days. 3.92 mL    gabapentin  (NEURONTIN ) 100 MG capsule Take 1 capsule (100 mg total) by mouth 3 (three) times daily as needed (pain). 20 capsule 0   hydrochlorothiazide  (HYDRODIURIL ) 12.5 MG tablet Take 1 tablet (12.5 mg total) by mouth daily. (Patient not taking: Reported on 07/24/2024) 90 tablet 3   methocarbamol (ROBAXIN) 500 MG tablet Take 500 mg by mouth 2 (two) times daily as needed for muscle spasms.     OVER THE COUNTER MEDICATION Take 1 Dose by mouth daily at 8 pm. 3 tsp powder consisting of: Magnesium carbonate 320 mg, vitamin C 265 mg, vitamin D  3 mcg, calcium 210 mg, potassium 110 mg     Triamcinolone  Acetonide (NASACORT  AQ NA) Place 1 spray into both nostrils daily as needed (allergies).     Current Facility-Administered Medications  Medication Dose Route Frequency Provider Last Rate Last Admin   0.9 %  sodium chloride  infusion  500 mL Intravenous Once Jensine Luz, Gordy HERO, MD        Allergies as of 09/05/2024 - Review Complete 09/05/2024  Allergen Reaction Noted   Gluten meal Other (See Comments) 03/13/2013   Latex Itching 03/13/2013    Family History  Problem Relation Age of Onset   Diabetes Mother        2, diet controlled   Anxiety disorder Mother     Arthritis Mother    Depression Mother    Hyperlipidemia Mother    Hypertension Mother    Migraines Mother    Colon cancer Father        age 82   Cancer Father        Carcinoid Syndrome, colon   Headache Father    Early death Father    Migraines Father    Ovarian cancer Maternal Aunt    Heart disease Maternal Grandmother    Stroke Paternal Grandmother    Dementia Paternal Grandmother    Varicose Veins Paternal Grandmother    COPD Paternal Grandfather  black lun, forensic scientist, tobacco   ADD / ADHD Son    Colon polyps Neg Hx    Esophageal cancer Neg Hx    Stomach cancer Neg Hx    Rectal cancer Neg Hx     Social History   Socioeconomic History   Marital status: Married    Spouse name: Not on file   Number of children: Not on file   Years of education: Not on file   Highest education level: Associate degree: occupational, scientist, product/process development, or vocational program  Occupational History   Not on file  Tobacco Use   Smoking status: Never   Smokeless tobacco: Never  Vaping Use   Vaping status: Never Used  Substance and Sexual Activity   Alcohol use: Yes    Comment: 1-2 month   Drug use: Never   Sexual activity: Yes    Partners: Male    Birth control/protection: Surgical    Comment: lives with husband (ADD) and children, 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  Other Topics Concern   Not on file  Social History Narrative   Lives at home with husband and 2 children   Right handed   Caffeine: 2-3 cups/day   Social Drivers of Health   Tobacco Use: Low Risk (09/05/2024)   Patient History    Smoking Tobacco Use: Never    Smokeless Tobacco Use: Never    Passive Exposure: Not on file  Financial Resource Strain: Low Risk (12/11/2023)   Overall Financial Resource Strain (CARDIA)    Difficulty of Paying Living Expenses: Not hard at all  Food Insecurity: No Food Insecurity (12/11/2023)   Hunger Vital Sign    Worried About Running Out of Food in the Last Year: Never  true    Ran Out of Food in the Last Year: Never true  Transportation Needs: No Transportation Needs (12/11/2023)   PRAPARE - Administrator, Civil Service (Medical): No    Lack of Transportation (Non-Medical): No  Physical Activity: Insufficiently Active (12/11/2023)   Exercise Vital Sign    Days of Exercise per Week: 4 days    Minutes of Exercise per Session: 30 min  Stress: Stress Concern Present (12/11/2023)   [employer] of Occupational Health - Occupational Stress Questionnaire    Feeling of Stress : To some extent  Social Connections: Moderately Integrated (12/11/2023)   Social Connection and Isolation Panel    Frequency of Communication with Friends and Family: Once a week    Frequency of Social Gatherings with Friends and Family: Once a week    Attends Religious Services: More than 4 times per year    Active Member of [employer] or Organizations: Yes    Attends Banker Meetings: 1 to 4 times per year    Marital Status: Married  Catering Manager Violence: Not on file  Depression (PHQ2-9): Low Risk (05/08/2024)   Depression (PHQ2-9)    PHQ-2 Score: 3  Alcohol Screen: Low Risk (12/11/2023)   Alcohol Screen    Last Alcohol Screening Score (AUDIT): 1  Housing: Low Risk (12/11/2023)   Housing Stability Vital Sign    Unable to Pay for Housing in the Last Year: No    Number of Times Moved in the Last Year: 0    Homeless in the Last Year: No  Utilities: Not on file  Health Literacy: Not on file    Physical Exam: Vital signs in last 24 hours: @BP  134/73   Pulse 82   Temp (!) 97.3  F (36.3 C)   Ht 5' (1.524 m)   Wt 142 lb (64.4 kg)   SpO2 98%   BMI 27.73 kg/m  GEN: NAD EYE: Sclerae anicteric ENT: MMM CV: Non-tachycardic Pulm: CTA b/l GI: Soft, NT/ND NEURO:  Alert & Oriented x 3   Gordy Starch, MD [facility]  09/05/2024 9:50 AM

## 2024-09-05 NOTE — Patient Instructions (Signed)
 Resume previous diet.  Continue present medications.  Await pathology results.  If no response to empiric dilation and biopsies unrevealing then barium esophagram is recommended to evaluate esophageal motility (manometry also to be considered based on these results).  YOU HAD AN ENDOSCOPIC PROCEDURE TODAY AT THE [facility]:   Refer to the procedure report that was given to you for any specific questions about what was found during the examination.  If the procedure report does not answer your questions, please call your gastroenterologist to clarify.  If you requested that your care partner not be given the details of your procedure findings, then the procedure report has been included in a sealed envelope for you to review at your convenience later.  YOU SHOULD EXPECT: Some feelings of bloating in the abdomen. Passage of more gas than usual.  Walking can help get rid of the air that was put into your GI tract during the procedure and reduce the bloating. If you had a lower endoscopy (such as a colonoscopy or flexible sigmoidoscopy) you may notice spotting of blood in your stool or on the toilet paper. If you underwent a bowel prep for your procedure, you may not have a normal bowel movement for a few days.  Please Note:  You might notice some irritation and congestion in your nose or some drainage.  This is from the oxygen used during your procedure.  There is no need for concern and it should clear up in a day or so.  SYMPTOMS TO REPORT IMMEDIATELY: Following upper endoscopy (EGD)  Vomiting of blood or coffee ground material  New chest pain or pain under the shoulder blades  Painful or persistently difficult swallowing  New shortness of breath  Fever of 100F or higher  Black, tarry-looking stools  For urgent or emergent issues, a gastroenterologist can be reached at any hour by calling [phone]. Do not use MyChart messaging for urgent concerns.    DIET:  We do  recommend a small meal at first, but then you may proceed to your regular diet.  Drink plenty of fluids but you should avoid alcoholic beverages for 24 hours.  ACTIVITY:  You should plan to take it easy for the rest of today and you should NOT DRIVE or use heavy machinery until tomorrow (because of the sedation medicines used during the test).    FOLLOW UP: Our staff will call the number listed on your records the next business day following your procedure.  We will call around 7:15- 8:00 am to check on you and address any questions or concerns that you may have regarding the information given to you following your procedure. If we do not reach you, we will leave a message.     If any biopsies were taken you will be contacted by phone or by letter within the next 1-3 weeks.  Please call us  at [phone] if you have not heard about the biopsies in 3 weeks.    SIGNATURES/CONFIDENTIALITY: You and/or your care partner have signed paperwork which will be entered into your electronic medical record.  These signatures attest to the fact that that the information above on your After Visit Summary has been reviewed and is understood.  Full responsibility of the confidentiality of this discharge information lies with you and/or your care-partner.

## 2024-09-05 NOTE — Progress Notes (Signed)
VS by MO    Pt's states no medical or surgical changes since previsit or office visit.

## 2024-09-05 NOTE — Progress Notes (Signed)
 Called to room to assist during endoscopic procedure.  Patient ID and intended procedure confirmed with present staff. Received instructions for my participation in the procedure from the performing physician.

## 2024-09-05 NOTE — Progress Notes (Signed)
 Sedate, gd SR, tolerated procedure well, VSS, report to RN

## 2024-09-08 ENCOUNTER — Telehealth: Payer: Self-pay | Admitting: *Deleted

## 2024-09-08 ENCOUNTER — Ambulatory Visit: Admitting: Neurology

## 2024-09-08 NOTE — Telephone Encounter (Signed)
°  Follow up Call-     09/05/2024    9:14 AM 05/08/2022    8:13 AM  Call back number  Post procedure Call Back phone  # [phone]  Permission to leave phone message Yes Yes     Patient questions:  Do you have a fever, pain , or abdominal swelling? No. Pain Score  0 *  Have you tolerated food without any problems? Yes.    Have you been able to return to your normal activities? Yes.    Do you have any questions about your discharge instructions: Diet   No. Medications  No. Follow up visit  No.  Do you have questions or concerns about your Care? No.  Actions: * If pain score is 4 or above: No action needed, pain <4.

## 2024-09-09 ENCOUNTER — Ambulatory Visit: Admitting: Adult Health

## 2024-09-09 DIAGNOSIS — G43709 Chronic migraine without aura, not intractable, without status migrainosus: Secondary | ICD-10-CM

## 2024-09-09 LAB — SURGICAL PATHOLOGY

## 2024-09-09 MED ORDER — ONABOTULINUMTOXINA 200 UNITS IJ SOLR
155.0000 [IU] | Freq: Once | INTRAMUSCULAR | Status: AC
  Administered 2024-09-09: 14:00:00 155 [IU] via INTRAMUSCULAR

## 2024-09-09 NOTE — Progress Notes (Signed)
 Botox - 200 units x 1 vial Lot: [id number] Expiration: 11/2026 NDC: 9976-6078-97   Bacteriostatic 0.9% Sodium Chloride - 4 mL  Lot: [id number] Expiration: 07/25/2025 NDC: 9590-8033-97   Dx: H56.290 Sp Witnessed by Rojean

## 2024-09-09 NOTE — Progress Notes (Signed)
 Update 09/09/2024 JM: Patient is being seen for repeat Botox  injection, prior injection 06/09/2024.  Reports continued benefit with Botox  injections with>50% migraine reduction. Has had some increase migraines over the past few weeks as botox  wearing off and weather changes. Tolerated procedure well today.  Return in 3 months for repeat injection.        Consent Form Botulism Toxin Injection For Chronic Migraine    Reviewed orally with patient, additionally signature is on file:  Botulism toxin has been approved by the Federal drug administration for treatment of chronic migraine. Botulism toxin does not cure chronic migraine and it may not be effective in some patients.  The administration of botulism toxin is accomplished by injecting a small amount of toxin into the muscles of the neck and head. Dosage must be titrated for each individual. Any benefits resulting from botulism toxin tend to wear off after 3 months with a repeat injection required if benefit is to be maintained. Injections are usually done every 3-4 months with maximum effect peak achieved by about 2 or 3 weeks. Botulism toxin is expensive and you should be sure of what costs you will incur resulting from the injection.  The side effects of botulism toxin use for chronic migraine may include:   -Transient, and usually mild, facial weakness with facial injections  -Transient, and usually mild, head or neck weakness with head/neck injections  -Reduction or loss of forehead facial animation due to forehead muscle weakness  -Eyelid drooping  -Dry eye  -Pain at the site of injection or bruising at the site of injection  -Double vision  -Potential unknown long term risks   Contraindications: You should not have Botox  if you are pregnant, nursing, allergic to albumin, have an infection, skin condition, or muscle weakness at the site of the injection, or have myasthenia gravis, Lambert-Eaton syndrome, or ALS.  It is  also possible that as with any injection, there may be an allergic reaction or no effect from the medication. Reduced effectiveness after repeated injections is sometimes seen and rarely infection at the injection site may occur. All care will be taken to prevent these side effects. If therapy is given over a long time, atrophy and wasting in the muscle injected may occur. Occasionally the patient's become refractory to treatment because they develop antibodies to the toxin. In this event, therapy needs to be modified.  I have read the above information and consent to the administration of botulism toxin.    BOTOX  PROCEDURE NOTE FOR MIGRAINE HEADACHE  Contraindications and precautions discussed with patient(above). Aseptic procedure was observed and patient tolerated procedure. Procedure performed by Harlene Bogaert, AGNP-BC.   The condition has existed for more than 6 months, and pt does not have a diagnosis of ALS, Myasthenia Gravis or Lambert-Eaton Syndrome.  Risks and benefits of injections discussed and pt agrees to proceed with the procedure.  Written consent obtained  These injections are medically necessary. Pt  receives good benefits from these injections. These injections do not cause sedations or hallucinations which the oral therapies may cause.   Description of procedure:  The patient was placed in a sitting position. The standard protocol was used for Botox  as follows, with 5 units of Botox  injected at each site:  -Procerus muscle, midline injection  -Corrugator muscle, bilateral injection  -Frontalis muscle, bilateral injection, with 2 sites each side, medial injection was performed in the upper one third of the frontalis muscle, in the region vertical from the medial  inferior edge of the superior orbital rim. The lateral injection was again in the upper one third of the forehead vertically above the lateral limbus of the cornea, 1.5 cm lateral to the medial injection  site.  -Temporalis muscle injection, 4 sites, bilaterally. The first injection was 3 cm above the tragus of the ear, second injection site was 1.5 cm to 3 cm up from the first injection site in line with the tragus of the ear. The third injection site was 1.5-3 cm forward between the first 2 injection sites. The fourth injection site was 1.5 cm posterior to the second injection site. 5th site laterally in the temporalis  muscleat the level of the outer canthus.  -Occipitalis muscle injection, 3 sites, bilaterally. The first injection was done one half way between the occipital protuberance and the tip of the mastoid process behind the ear. The second injection site was done lateral and superior to the first, 1 fingerbreadth from the first injection. The third injection site was 1 fingerbreadth superiorly and medially from the first injection site.  -Cervical paraspinal muscle injection, 2 sites, bilaterally. The first injection site was 1 cm from the midline of the cervical spine, 3 cm inferior to the lower border of the occipital protuberance. The second injection site was 1.5 cm superiorly and laterally to the first injection site.  -Trapezius muscle injection was performed at 3 sites, bilaterally. The first injection site was in the upper trapezius muscle halfway between the inflection point of the neck, and the acromion. The second injection site was one half way between the acromion and the first injection site. The third injection was done between the first injection site and the inflection point of the neck.    A total of 200 units of Botox  was prepared, 155 units of Botox  was injected as documented above, any Botox  not injected was wasted. The patient tolerated the procedure well, there were no complications of the above procedure.   Harlene Bogaert, AGNP-BC  [facility] [address] [place], [place] [address]  Phone [phone] Fax [phone] Note:  This document was prepared with digital dictation and possible smart phrase technology. Any transcriptional errors that result from this process are unintentional.

## 2024-09-10 ENCOUNTER — Ambulatory Visit: Payer: Self-pay | Admitting: Internal Medicine

## 2024-09-10 DIAGNOSIS — R1319 Other dysphagia: Secondary | ICD-10-CM

## 2024-09-10 DIAGNOSIS — R09A2 Foreign body sensation, throat: Secondary | ICD-10-CM

## 2024-09-10 DIAGNOSIS — K219 Gastro-esophageal reflux disease without esophagitis: Secondary | ICD-10-CM

## 2024-09-16 NOTE — Progress Notes (Signed)
 "  Established Patient Office Visit  Subjective:  Patient ID: Whitney Trujillo, female    DOB: May 04, 1976  Age: 48 y.o. MRN: [id number]  CC:  Chief Complaint  Patient presents with   Medical Management of Chronic Issues      HPI Whitney Trujillo is here for 41-month follow up.   Discussed the use of AI scribe software for clinical note transcription with the patient, who gave verbal consent to proceed.  History of Present Illness Whitney Trujillo is a 48 year old female who presents for a regular follow-up visit.  Her blood pressure has been stable at home. She was previously on hydrochlorothiazide  but has not resumed it since stopping. She has not resumed it since stopping.  She discusses her experience with Cymbalta , noting that she was on a double dose previously, which she believes was contributing to her high blood pressure. She has since adjusted her dosage of Cymbalta  and home BP readings have been stable without the hydrochlorothiazide  needed.   She recently underwent an esophagogastroduodenoscopy (EGD) and was started on pantoprazole , which has provided some relief for her gastroesophageal reflux disease (GERD) symptoms. However, she continues to experience significant regurgitation. She is scheduled for a barium swallow study in January to further evaluate her condition.  She is not currently taking any ADHD medications as they made her feel uncomfortable.  She receives Botox  injections for neck tension and headaches, which have reduced the frequency of her headaches, but she still experiences stress and tension in her neck. She is due for follow-up with Neuro in March.     Past Medical History:  Diagnosis Date   Acute pulmonary embolism  5 wks post hysterectomy 07/07/2014   Acute right lower quadrant pain 01/25/2016   ADD (attention deficit disorder) 02/02/2015   Allergy    seasonal allergies   Anxiety    Arthritis    Bilateral occipital neuralgia  05/04/2020   Colon cancer screening 12/16/2015   Depression with anxiety 03/15/2016   Endometriosis    Endometriosis 07/07/2014   S/p TAH b/l SPO     Fibromyalgia    Flank pain 05/03/2021   Gallstones    Gross hematuria 05/03/2021   History of COVID-19 02/24/2020   Hyperlipidemia, mixed 02/02/2015   Left-sided weakness 02/03/2022   Migraine 12/16/2015   Osteoarthritis    Preventative health care 12/16/2015   Primary hypertension 12/27/2023   Pulmonary emboli (HCC)    b/l PE after hysterectomy, 5 weeks post op, clotting problem ruled out at [facility]    Past Surgical History:  Procedure Laterality Date   ABDOMINAL HYSTERECTOMY  2010   1- ovary then second ovary ovary later   CESAREAN SECTION  2007, 2009   CHOLECYSTECTOMY  2015   CYSTOSCOPY  10/2014   EYE SURGERY     lasik b/l   HERNIA REPAIR     LAPAROTOMY     Endometriosis   OOPHORECTOMY  2013   TUBAL LIGATION     UMBILICAL HERNIA REPAIR  2019   also epigastric hernia removed at the same time per pt   WISDOM TOOTH EXTRACTION      Family History  Problem Relation Age of Onset   Diabetes Mother        2, diet controlled   Anxiety disorder Mother    Arthritis Mother    Depression Mother    Hyperlipidemia Mother    Hypertension Mother    Migraines Mother    Colon cancer Father  age 25   Cancer Father        Carcinoid Syndrome, colon   Headache Father    Early death Father    Migraines Father    Ovarian cancer Maternal Aunt    Heart disease Maternal Grandmother    Stroke Paternal Grandmother    Dementia Paternal Grandmother    Varicose Veins Paternal Grandmother    COPD Paternal Grandfather        black lun, forensic scientist, tobacco   ADD / ADHD Son    Colon polyps Neg Hx    Esophageal cancer Neg Hx    Stomach cancer Neg Hx    Rectal cancer Neg Hx     Social History   Socioeconomic History   Marital status: Married    Spouse name: Not on file   Number of children: Not on file   Years of education:  Not on file   Highest education level: Associate degree: occupational, scientist, product/process development, or vocational program  Occupational History   Not on file  Tobacco Use   Smoking status: Never   Smokeless tobacco: Never  Vaping Use   Vaping status: Never Used  Substance and Sexual Activity   Alcohol use: Yes    Comment: 1-2 month   Drug use: Never   Sexual activity: Yes    Partners: Male    Birth control/protection: Surgical    Comment: lives with husband (ADD) and children, 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  Other Topics Concern   Not on file  Social History Narrative   Lives at home with husband and 2 children   Right handed   Caffeine: 2-3 cups/day   Social Drivers of Health   Tobacco Use: Low Risk (09/17/2024)   Patient History    Smoking Tobacco Use: Never    Smokeless Tobacco Use: Never    Passive Exposure: Not on file  Financial Resource Strain: Low Risk (12/11/2023)   Overall Financial Resource Strain (CARDIA)    Difficulty of Paying Living Expenses: Not hard at all  Food Insecurity: No Food Insecurity (12/11/2023)   Hunger Vital Sign    Worried About Running Out of Food in the Last Year: Never true    Ran Out of Food in the Last Year: Never true  Transportation Needs: No Transportation Needs (12/11/2023)   PRAPARE - Administrator, Civil Service (Medical): No    Lack of Transportation (Non-Medical): No  Physical Activity: Insufficiently Active (12/11/2023)   Exercise Vital Sign    Days of Exercise per Week: 4 days    Minutes of Exercise per Session: 30 min  Stress: Stress Concern Present (12/11/2023)   [employer] of Occupational Health - Occupational Stress Questionnaire    Feeling of Stress : To some extent  Social Connections: Moderately Integrated (12/11/2023)   Social Connection and Isolation Panel    Frequency of Communication with Friends and Family: Once a week    Frequency of Social Gatherings with Friends and Family: Once a week     Attends Religious Services: More than 4 times per year    Active Member of Clubs or Organizations: Yes    Attends Banker Meetings: 1 to 4 times per year    Marital Status: Married  Catering Manager Violence: Not on file  Depression (PHQ2-9): Medium Risk (09/17/2024)   Depression (PHQ2-9)    PHQ-2 Score: 6  Alcohol Screen: Low Risk (12/11/2023)   Alcohol Screen    Last Alcohol Screening Score (AUDIT): 1  Housing: Low Risk (12/11/2023)   Housing Stability Vital Sign    Unable to Pay for Housing in the Last Year: No    Number of Times Moved in the Last Year: 0    Homeless in the Last Year: No  Utilities: Not on file  Health Literacy: Not on file    ROS All ROS negative except what is listed in the HPI.   Objective:   Today's Vitals: BP 118/74   Pulse 88   Ht 5' (1.524 m)   Wt 143 lb (64.9 kg)   SpO2 100%   BMI 27.93 kg/m   Physical Exam Vitals reviewed.  Constitutional:      General: She is not in acute distress.    Appearance: Normal appearance. She is normal weight. She is not ill-appearing.  Cardiovascular:     Rate and Rhythm: Normal rate and regular rhythm.     Heart sounds: Normal heart sounds.  Pulmonary:     Effort: Pulmonary effort is normal.     Breath sounds: Normal breath sounds.  Skin:    General: Skin is warm and dry.  Neurological:     General: No focal deficit present.     Mental Status: She is alert and oriented to person, place, and time. Mental status is at baseline.  Psychiatric:        Mood and Affect: Mood normal.        Behavior: Behavior normal.        Thought Content: Thought content normal.        Judgment: Judgment normal.           Assessment & Plan:   Problem List Items Addressed This Visit       Active Problems   ADD (attention deficit disorder)   Stable without medication.       Primary hypertension - Primary   Blood pressure well-controlled at 118/74 mmHg. Discontinued hydrochlorothiazide  due to low  readings. Cymbalta  may have contributed to previous high readings.         Follow-up: Return for physical - summer 2026.   Waddell FURY Almarie, DNP, FNP-C  I,Emily Lagle,acting as a neurosurgeon for Waddell KATHEE Almarie, NP.,have documented all relevant documentation on the behalf of Waddell KATHEE Almarie, NP.  I, Waddell KATHEE Almarie, NP, have reviewed all documentation for this visit. The documentation on 09/17/2024 for the exam, diagnosis, procedures, and orders are all accurate and complete. "

## 2024-09-17 ENCOUNTER — Ambulatory Visit: Admitting: Family Medicine

## 2024-09-17 ENCOUNTER — Encounter: Payer: Self-pay | Admitting: Family Medicine

## 2024-09-17 VITALS — BP 118/74 | HR 88 | Ht 60.0 in | Wt 143.0 lb

## 2024-09-17 DIAGNOSIS — F988 Other specified behavioral and emotional disorders with onset usually occurring in childhood and adolescence: Secondary | ICD-10-CM | POA: Diagnosis not present

## 2024-09-17 DIAGNOSIS — I10 Essential (primary) hypertension: Secondary | ICD-10-CM

## 2024-09-17 DIAGNOSIS — Z79899 Other long term (current) drug therapy: Secondary | ICD-10-CM

## 2024-09-17 DIAGNOSIS — E782 Mixed hyperlipidemia: Secondary | ICD-10-CM

## 2024-09-17 NOTE — Assessment & Plan Note (Signed)
 Blood pressure well-controlled at 118/74 mmHg. Discontinued hydrochlorothiazide  due to low readings. Cymbalta  may have contributed to previous high readings.

## 2024-09-17 NOTE — Assessment & Plan Note (Signed)
 Stable without medication

## 2024-10-01 ENCOUNTER — Other Ambulatory Visit: Payer: Self-pay | Admitting: Family Medicine

## 2024-10-01 DIAGNOSIS — M5416 Radiculopathy, lumbar region: Secondary | ICD-10-CM

## 2024-10-01 NOTE — Telephone Encounter (Signed)
 Confirmed with patient the correct dosage, last note states she adjusted it (she was taking double). Patient states she was on 120 mg daily and 60 mg daily is the correct dose. RX sent.

## 2024-10-09 ENCOUNTER — Ambulatory Visit (HOSPITAL_COMMUNITY)
Admission: RE | Admit: 2024-10-09 | Discharge: 2024-10-09 | Disposition: A | Discharge status: Home / Self Care | Source: Ambulatory Visit | Attending: Internal Medicine | Admitting: Internal Medicine

## 2024-10-09 ENCOUNTER — Ambulatory Visit: Payer: Self-pay | Admitting: Internal Medicine

## 2024-10-09 DIAGNOSIS — R09A2 Foreign body sensation, throat: Secondary | ICD-10-CM | POA: Insufficient documentation

## 2024-10-09 DIAGNOSIS — R1319 Other dysphagia: Secondary | ICD-10-CM | POA: Diagnosis present

## 2024-10-09 DIAGNOSIS — K219 Gastro-esophageal reflux disease without esophagitis: Secondary | ICD-10-CM | POA: Insufficient documentation

## 2024-12-09 ENCOUNTER — Ambulatory Visit: Admitting: Adult Health

## 2025-04-17 ENCOUNTER — Encounter: Admitting: Family Medicine

## 2025-08-27 ENCOUNTER — Ambulatory Visit: Admitting: Adult Health
